# Patient Record
Sex: Female | Born: 1975 | Race: White | Hispanic: No | Smoking: Never smoker
Health system: Southern US, Community
[De-identification: ages and names within clinical notes are randomized; demographics above are authoritative.]

## PROBLEM LIST (undated history)

## (undated) DIAGNOSIS — M26609 Unspecified temporomandibular joint disorder, unspecified side: Secondary | ICD-10-CM

## (undated) DIAGNOSIS — F32A Depression, unspecified: Secondary | ICD-10-CM

## (undated) DIAGNOSIS — F329 Major depressive disorder, single episode, unspecified: Secondary | ICD-10-CM

## (undated) DIAGNOSIS — N62 Hypertrophy of breast: Secondary | ICD-10-CM

## (undated) DIAGNOSIS — F419 Anxiety disorder, unspecified: Secondary | ICD-10-CM

## (undated) DIAGNOSIS — K219 Gastro-esophageal reflux disease without esophagitis: Secondary | ICD-10-CM

## (undated) HISTORY — PX: REDUCTION MAMMAPLASTY: SUR839

## (undated) HISTORY — PX: APPENDECTOMY: SHX54

## (undated) HISTORY — DX: Anxiety disorder, unspecified: F41.9

## (undated) HISTORY — DX: Major depressive disorder, single episode, unspecified: F32.9

## (undated) HISTORY — DX: Depression, unspecified: F32.A

---

## 2001-08-02 ENCOUNTER — Ambulatory Visit (HOSPITAL_COMMUNITY): Admission: RE | Admit: 2001-08-02 | Discharge: 2001-08-02 | Payer: Self-pay | Admitting: *Deleted

## 2001-08-02 ENCOUNTER — Encounter (INDEPENDENT_AMBULATORY_CARE_PROVIDER_SITE_OTHER): Payer: Self-pay | Admitting: Specialist

## 2001-08-02 HISTORY — PX: HYSTEROSCOPY WITH D & C: SHX1775

## 2002-04-14 ENCOUNTER — Inpatient Hospital Stay (HOSPITAL_COMMUNITY): Admission: RE | Admit: 2002-04-14 | Discharge: 2002-04-16 | Payer: Self-pay | Admitting: *Deleted

## 2002-04-15 HISTORY — PX: HYSTERECTOMY ABDOMINAL WITH SALPINGO-OOPHORECTOMY: SHX6792

## 2003-03-12 ENCOUNTER — Encounter: Payer: Self-pay | Admitting: Emergency Medicine

## 2003-03-12 ENCOUNTER — Emergency Department (HOSPITAL_COMMUNITY): Admission: EM | Admit: 2003-03-12 | Discharge: 2003-03-12 | Payer: Self-pay | Admitting: Emergency Medicine

## 2006-07-15 ENCOUNTER — Emergency Department (HOSPITAL_COMMUNITY): Admission: EM | Admit: 2006-07-15 | Discharge: 2006-07-15 | Payer: Self-pay | Admitting: Emergency Medicine

## 2007-03-06 ENCOUNTER — Emergency Department (HOSPITAL_COMMUNITY): Admission: EM | Admit: 2007-03-06 | Discharge: 2007-03-06 | Payer: Self-pay | Admitting: Emergency Medicine

## 2008-11-27 ENCOUNTER — Ambulatory Visit (HOSPITAL_COMMUNITY): Admission: RE | Admit: 2008-11-27 | Discharge: 2008-11-27 | Payer: Self-pay | Admitting: General Surgery

## 2010-07-04 ENCOUNTER — Ambulatory Visit (HOSPITAL_COMMUNITY): Admission: RE | Admit: 2010-07-04 | Discharge: 2010-07-04 | Payer: Self-pay | Admitting: Family Medicine

## 2010-08-01 ENCOUNTER — Ambulatory Visit (HOSPITAL_COMMUNITY): Admission: RE | Admit: 2010-08-01 | Discharge: 2010-08-01 | Payer: Self-pay | Admitting: General Surgery

## 2010-08-16 ENCOUNTER — Ambulatory Visit (HOSPITAL_COMMUNITY): Admission: RE | Admit: 2010-08-16 | Discharge: 2010-08-17 | Payer: Self-pay | Admitting: General Surgery

## 2010-08-16 ENCOUNTER — Encounter (INDEPENDENT_AMBULATORY_CARE_PROVIDER_SITE_OTHER): Payer: Self-pay | Admitting: General Surgery

## 2010-08-16 HISTORY — PX: LAPAROSCOPIC CHOLECYSTECTOMY: SUR755

## 2011-01-19 ENCOUNTER — Encounter: Payer: Self-pay | Admitting: General Surgery

## 2011-01-19 ENCOUNTER — Encounter: Payer: Self-pay | Admitting: Family Medicine

## 2011-03-13 LAB — CBC
HCT: 39 % (ref 36.0–46.0)
Hemoglobin: 13.2 g/dL (ref 12.0–15.0)
MCH: 29.9 pg (ref 26.0–34.0)
MCH: 30.3 pg (ref 26.0–34.0)
MCHC: 33.9 g/dL (ref 30.0–36.0)
MCV: 88.3 fL (ref 78.0–100.0)
MCV: 88.5 fL (ref 78.0–100.0)
Platelets: 139 10*3/uL — ABNORMAL LOW (ref 150–400)
RBC: 3.87 MIL/uL (ref 3.87–5.11)
RDW: 12.2 % (ref 11.5–15.5)
RDW: 12.6 % (ref 11.5–15.5)

## 2011-03-13 LAB — DIFFERENTIAL
Basophils Absolute: 0 10*3/uL (ref 0.0–0.1)
Basophils Relative: 1 % (ref 0–1)
Lymphocytes Relative: 33 % (ref 12–46)
Lymphs Abs: 2.9 10*3/uL (ref 0.7–4.0)
Monocytes Relative: 8 % (ref 3–12)
Neutro Abs: 3.5 10*3/uL (ref 1.7–7.7)
Neutro Abs: 4.6 10*3/uL (ref 1.7–7.7)
Neutrophils Relative %: 57 % (ref 43–77)

## 2011-03-13 LAB — HEPATIC FUNCTION PANEL
ALT: 68 U/L — ABNORMAL HIGH (ref 0–35)
AST: 15 U/L (ref 0–37)
AST: 56 U/L — ABNORMAL HIGH (ref 0–37)
Albumin: 4.2 g/dL (ref 3.5–5.2)
Alkaline Phosphatase: 55 U/L (ref 39–117)
Bilirubin, Direct: 0.1 mg/dL (ref 0.0–0.3)
Bilirubin, Direct: 0.2 mg/dL (ref 0.0–0.3)
Indirect Bilirubin: 0.5 mg/dL (ref 0.3–0.9)
Indirect Bilirubin: 0.7 mg/dL (ref 0.3–0.9)
Total Protein: 7.1 g/dL (ref 6.0–8.3)

## 2011-03-13 LAB — BASIC METABOLIC PANEL
Calcium: 9.1 mg/dL (ref 8.4–10.5)
Creatinine, Ser: 0.83 mg/dL (ref 0.4–1.2)
Creatinine, Ser: 0.83 mg/dL (ref 0.4–1.2)
GFR calc Af Amer: >60 mL/min (ref >60)
GFR calc non Af Amer: >60 mL/min (ref >60)
Glucose, Bld: 87 mg/dL (ref 70–99)
Glucose, Bld: 97 mg/dL (ref 70–99)
Potassium: 3.9 mEq/L (ref 3.5–5.1)
Potassium: 4 mEq/L (ref 3.5–5.1)

## 2011-04-16 HISTORY — PX: TOTAL ABDOMINAL HYSTERECTOMY W/ BILATERAL SALPINGOOPHORECTOMY: SHX83

## 2011-05-16 NOTE — Op Note (Signed)
[facility]  Patient:    Sandra Parker, Sandra Parker Visit Number: [id number] MRN: [id number]          Service Type: SUR Location: 4A A426 01 Attending Physician:  Jeri Cos. Dictated by:   Roylene Reason. Lisette Grinder, M.D. Proc. Date: 04/15/02 Admit Date:  04/14/2002 Discharge Date: 04/16/2002                             Operative Report  PREOPERATIVE DIAGNOSIS:  1. Menometrorrhagia.  2. Dysmenorrhea. 3. Deep penetration dyspareunia.  POSTOPERATIVE DIAGNOSIS:  1. Menometrorrhagia.  2. Dysmenorrhea. 3. Deep penetration dyspareunia.  OPERATION/PROCEDURE:  1. Total abdominal hysterectomy. 2. Left salpingo-oophorectomy  SURGEON:  Roylene Reason. Lisette Grinder, M.D.  ESTIMATED BLOOD LOSS:  200 cc.  COMPLICATIONS:  None.  SPECIMENS:  Left tube and ovary, cervix and uterus for permanent section only.  DRAINS:  JP within the subcutaneous space, Foley catheter to straight drainage.  ANESTHESIA:  General endotracheal anesthesia  FINDINGS:  At the time of surgery include adhesive disease in the right adnexal region, normal appearing left tube and ovary.  Diffusely enlarged uterus.  SUMMARY:  The patient was taken to the operating room; vital signs were stable.  The patient underwent uncomplicated induction of general endotracheal anesthesia.  She was prepped and draped in the usual sterile manner.  Foley catheter placed to straight drainage.  Findings of clear yellow urine, sharp knife used to incise the previous Pfannenstiel incision through the skin dissected down to the fascial plane utilizing the sharp knife, cauterizing all bleeders along the way.  The fascia was then incised in a transverse curvilinear manner utilizing the Mayo scissors while sharply dissecting off the underlying rectus muscle.  The fascial edges were then grasped using straight Kocher clamps.  The fascia was dissected off the underlying rectus muscle in the midline utilizing sharp dissection with  the Mayo scissors.  The rectus muscle was bluntly separated. The peritoneal cavity was atraumatically bluntly entered at the superior-most portion of the incision.  The peritoneal incision was extended superiorly and inferiorly.  Inferiorly I directly visualized the bladder to avoid its accidental entry.  A Balfour retractor was then placed.  Bladder blade was replaced.  Long straight Kocher clamps were used to grasp the specimen at the junction of the round ligament and Fallopian tubes and the uterus itself.  This was made difficult in the right adnexal region by omental adhesions to the right fundal portion of the uterus which were sharply dissected in the avascular plane. The specimen was then manipulated throughout the operative procedure utilizing straight Kocher clamps.  The right round ligament was identified.  A Kelly clamp was placed to control back bleeding.  Suture ligature of #0 Vicryl in a Heaney fashion was placed. Right round ligament was transected.  I continued the dissection across the anterior lower uterine segment in the avascular plane to create a bladder flap from the vesicouterine fold.  The left adnexal region was then managed. Kelly clamp was placed on the round ligament to control back bleeding.  Suture ligature of #0 Vicryl in a Heaney fashion was then placed in the left round ligament, and the left round ligament was then transected.  The infundibulopelvic ligament on the left was skeletonized in the avascular plane.  The ureter is noted to be in its normal anatomic position along the lateral pelvic sidewall free from harms way.  The left infundibulopelvic ligament was then doubly clamped with  Kelly clamps followed by being doubly ligated with first a #0 Vicryl free-tie followed by suture of #0 Vicryl in a Heaney fashion.  This results in excellent hemostasis in the pedicle.  The bladder flap is then continued on the left side across the anterior  lower uterine segment in the avascular plane.  The bladder flap was then created from the vesicouterine fold and is bluntly and sharply dissected off the anterior lower uterine segment.  Dissection is continued to push the bladder flap distal to the cervix.  Her pedicles are then continued after skeletonization of the uterine vessels bilaterally.  The right is then clamped with a Kelly clamp to control any back bleeding.  A curved Haney clamp is then placed across the right uterine vessel, followed by a suture ligature of #0 Vicryl to assure hemostasis.  The left uterine vessel was likewise handled in a similar manner utilizing curved Heaney clamp followed by suture ligature of #0 Vicryl to secure hemostasis.  These clamps were being removed the.  The major vascular supply o the uterus has been secured and the uterus is noted to blanch in its appearance due to cessation of major blood supply.  The right cardinal ligament was then grasped with a straight Heaney clamp followed by suture ligature of #0 Vicryl in a Heaney fashion.  The left was handled in a likewise manner.  An additional straight Heaney clamp was placed both on the right and on the left to secure the uterosacral ligaments, utilizing #0 Vicryl in a Heaney fashion.  This then brought me down to the vaginal angle.  The right vaginal angle was clamped with a curved Heaney clamp followed by suture ligature of #0 Vicryl in a Heaney fashion which was tagged for later inspection.  The left vaginal angle was likewise clamped with a curved Heaney clamp followed by suture ligature of Vicryl in a Heaney fashion and likewise clamped.  This then allows me to easily dissect off the vaginal cuff maintaining maximum vaginal length while being certain to remove the entirety of the cervix.  The specimen was then removed, inspection does reveal the entirety of the cervix to be included.  The vaginal angle was then elevated which allows  visualization of the vaginal cuff which then was easily closed utilizing #0 Vicryl figure-of-eight suture.  Careful examination of our pedicles, at this time, reveals a small amount of bleeding from the right uterosacral ligament area where our pedicle apparently slipped through the clamp, at the time of ligature.  This was then reclamped with a curved Heaney clamp followed by religation with a #0 Vicryl.  This then resulted in secure hemostasis at this area.  The vaginal cuff is noted to be secured, but a small amount of bleeding is noted to be occurring from beneath the bladder flap due to presence of adhesive disease encountered, due to the prior cesarean section.  The bladder flap was then reapproximated over the vaginal cuff utilizing continuous running, 3-0 Vicryl suture.  This restored the normal anatomy and resulted in excellent hemostasis.  Irrigation was then performed until clear. All pedicles being secured, ureter was noted to be out of harms way.  Our Balfour retaining retractor is removed.  Sponge and instrument counts are correct, at this time, thus the closure is initiated.  The peritoneal edges are grasped utilizing Kelly clamps followed by closure of the peritoneum with a #0 chromic continuous running suture.  This likewise reapproximated the overlying rectus muscle.  Bleeders on the  rectus muscles were then cauterized to assure hemostasis.  The fascia was then closed with a #1 Novofil double stranded suture to result in a secure closure.  Subcutaneous bleeders are then cauterized.  The JP drain is placed in the subcutaneous space with a separate exit wound to the right apex of the incision.  This is sutured into place.  Three retention-type sutures are then placed through the skin edges to facilitate stapling.  The skin is then completely closed utilizing skin staples.  To facilitate postoperative analgesia a total of 20 cc of 0.5% bupivacaine is injected along the  entirety of the incision.  The JP drain is connected and appears to be functioning well.  The patient continues to drain clear yellow urine.  The patient tolerated the procedure very well.  She is reversed of anesthesia, extubated, and taken to the recovery room in stable condition at which time the operative findings are discussed with the patient awaiting family.  The patient will be treated immediately postoperatively with a Climara estrogen replacement patch. Dictated by:   Roylene Reason. Lisette Grinder, M.D. Attending Physician:  Jeri Cos. DD:  04/15/02 TD:  04/17/02 Job: [id number]

## 2011-05-16 NOTE — Op Note (Signed)
[facility]  Patient:    Sandra Parker, Sandra Parker                          MRN: [id number] Proc. Date: 08/02/01 Adm. Date:  [id number] Attending:  Marin Comment                           Operative Report  PREOPERATIVE DIAGNOSES:  Menometrorrhagia unresponsive to oral contraceptive therapy, abnormal transvaginal sonogram suggesting filling defect.  POSTOPERATIVE DIAGNOSES:  Menometrorrhagia unresponsive to oral contraceptive therapy, abnormal transvaginal sonogram suggesting filling defect.  PROCEDURE:  Examination under anesthesia, fractional D&C hysteroscopy, collection of Pap smear.  ANESTHESIA:  MAC plus Marcaine paracervical block.  SURGEON:  Pershing Cox, M.D.  INDICATIONS FOR PROCEDURE:  This 35 year old female has been followed in my office for the last month. She has had normal menstrual periods until the onset of this bleeding episode which has been heavy, changing a pad as often as every 30 minutes during some of her days of flow. She was put on oral contraceptive taper and continued bleeding. She was seen in my office on last Tuesday and at that time, she had a sonogram performed which suggested a thickened endometrium of 1.2 cm in size and suggestion of endometrial polyp. She was still bleeding over the weekend but stopped yesterday. She is here today for [facility] hysteroscopy.  FINDINGS:  The uterus is anteflexed. The cavity was 8 cm in depth. The endometrial cavity was totally normal in appearance with symmetrical shape and normal tubal ostia noted. There were some fragments of endometrium adherent to the walls but otherwise no significant findings. Photographs were taken to document.  DESCRIPTION OF PROCEDURE:  [name] was brought to the operating room with an IV in place. In the holding area, she had received a gram of Ancef. She was placed supine on the OR table and IV sedation was administered. A bivalve speculum was inserted into  the vagina and a Pap smear was collected. Examination under anesthesia was performed confirming the fact that the uterus is anteflexed and there were no adnexal masses. The patient was then prepped with Hibiclens applying the solution to the anterior abdominal wall, perineum, vagina and upper thighs. A bivalve speculum was inserted into the vagina. Marcaine was injected in the anterior cervix and a single tooth tenaculum was placed. Endocervical curettings were obtained. Marcaine paracervical block was then administered at  the 3, 4, 7 and 8 positions using a total volume of 15 cc of 0.25% solution. This sound passed in an anteverted position to 8 cm. The cervix was then serially dilated with [facility] dilators to size 33. The hysteroscope was inserted and using through and through sorbitol irrigation, the cavity was distended and photographed. There were pieces of endometrium adherent to the walls but there was no evidence of filling defect and both tubal ostia were visualized. The hysteroscope was removed and a small sharp curette was used to curette the walls serially. Curettings were collected. The serrated curette was then applied and after scraping all of the walls again, the sharp curette finished the D&C.  The patient tolerated the procedure well. The instruments were removed. Her perineum was cleaned and she was taken to the recovery room for recovery. DD:  08/02/01 TD:  08/03/01 Job: [id number]

## 2011-05-16 NOTE — Discharge Summary (Signed)
[facility]  Patient:    Sandra Parker, Sandra Parker Visit Number: [id number] MRN: [id number]          Service Type: SUR Location: 4A A426 01 Attending Physician:  Jeri Cos. Dictated by:   Langley Gauss, M.D. Admit Date:  04/14/2002 Discharge Date: 04/16/2002                             Discharge Summary  PROCEDURE:  Total abdominal hysterectomy, left salpingo-oophorectomy.  DISCHARGE MEDICATIONS:  Tylox dispense #20 with no refill. The patient is given a copy of the standardized instructions at time of discharge. In addition, she has a Climara patch in place.  FOLLOWUP:  She will follow up in the office in three days time for staple removal.  LABORATORY DATA:  Blood type:  A positive. Admission hemoglobin/hematocrit 10.9/30.9. Postoperative day #1:  Hemoglobin 9.2, hematocrit 25.8. Postoperative day #2:  Hemoglobin 9.1, hematocrit 25.5 with a white count of 6.5.  HOSPITAL COURSE:  The patient had TAHLSO performed on April 14, 2002 without difficulty. Intraoperative blood loss of 200 cc was estimated. The patient had a Foley catheter and a JP drain within the subcutaneous space postoperatively. The patient did have a borderline urine output the p.m. of surgery. She was treated with a 500 cc bolus which resulted in approved urinary output. Such that the patient had adequate urine output thereafter, Foley catheter was removed on postoperative day #1. The patient thereafter was able to ambulate and void. The JP catheter did clot off relatively early in the postoperative course. Aspiration was unsuccessful in dislodging any clots, thus the JP catheter was discontinued on postoperative day #1. The patient thereafter did have a postoperative ileus with no passage of flatus. She was encouraged to ambulate. She did so. On the a.m. of postoperative day #2, the patient had still not passed any flatus, abdomen however was soft and nontender and nondistended. She  did have gas pains at that time. Thus, she was encouraged to ambulate and later in the day on postoperative day #2, the patient was freely passing flatus, tolerating a regular general diet, likewise tolerating p.o. narcotics for pain relief. The patient had minimal complaints of hot flashes and the Climara patch was adhering very well. Thus, the patient was discharged to home on postoperative day #2, given a copy of the standardized discharge instructions. Dictated by:   Langley Gauss, M.D. Attending Physician:  Jeri Cos. DD:  04/17/02 TD:  04/18/02 Job: [id number]

## 2012-01-30 ENCOUNTER — Other Ambulatory Visit: Payer: Self-pay

## 2012-08-04 ENCOUNTER — Telehealth: Payer: Self-pay | Admitting: Obstetrics and Gynecology

## 2012-08-04 ENCOUNTER — Telehealth: Payer: Self-pay

## 2012-08-04 NOTE — Telephone Encounter (Signed)
TRIAGE/APPT. °

## 2012-08-04 NOTE — Telephone Encounter (Signed)
Spoke with pt informed pt consulted with EP assistant no able to see pt today to due schd being already double booked will be happy to see her tomorrow pt states going out of town tomorrow advised pt can call her pcp to see if they can see her pt voice understadning

## 2012-08-04 NOTE — Telephone Encounter (Signed)
Spoke with pt rgd msg pt states having hormonal issues very emotional times 1 month pt states on HRT taking sentest thinks rx not helping wants appt for today advised pt no appt available today offered pt an appt for tomorrow with EP pt declined appt states going out of town advised pt will consult with ep call her back pt voice understanding

## 2012-09-27 ENCOUNTER — Encounter: Payer: Self-pay | Admitting: Obstetrics and Gynecology

## 2013-01-10 ENCOUNTER — Telehealth: Payer: Self-pay | Admitting: Obstetrics and Gynecology

## 2013-01-10 NOTE — Telephone Encounter (Signed)
Tc to pt per telephone call. Pt c/o right breast tenderness around nipple area. No nipple d/c. Appt sched 01/17/13 @ 2:45 for eval per EP. Pt aware AEX due 08/2012. Pt will sched @ later time. Pt agrees.

## 2013-01-17 ENCOUNTER — Encounter: Payer: Self-pay | Admitting: Obstetrics and Gynecology

## 2013-01-17 ENCOUNTER — Ambulatory Visit (HOSPITAL_COMMUNITY): Payer: Self-pay | Admitting: Physical Therapy

## 2013-03-24 ENCOUNTER — Telehealth: Payer: Self-pay | Admitting: Family Medicine

## 2013-03-24 DIAGNOSIS — F329 Major depressive disorder, single episode, unspecified: Secondary | ICD-10-CM

## 2013-03-24 DIAGNOSIS — F32A Depression, unspecified: Secondary | ICD-10-CM

## 2013-03-24 NOTE — Telephone Encounter (Signed)
Started on Lexapro 10mg  QD on 12/06/12.  C/o may need higher dose.  Getting very sad and low by end of day.

## 2013-03-25 MED ORDER — ESCITALOPRAM OXALATE 20 MG PO TABS: 20.0000 mg | ORAL_TABLET | Freq: Every day | ORAL | Status: DC

## 2013-03-25 NOTE — Telephone Encounter (Signed)
Callled patient and left voice mess.  Lexapro increased to 20mg /day.  NTBS in 6 weeks for follow up. Call and schedule appointment

## 2013-03-25 NOTE — Telephone Encounter (Signed)
Tell pt we can increase lexapro to 20 mg. Needs to schedule OV to be seen in 6 weeks for f/u. Call in Rx; Lexapro 20 mg one po QD 30 / one additional refill.

## 2013-03-28 ENCOUNTER — Telehealth: Payer: Self-pay | Admitting: Physician Assistant

## 2013-03-28 ENCOUNTER — Ambulatory Visit (INDEPENDENT_AMBULATORY_CARE_PROVIDER_SITE_OTHER): Payer: BC Managed Care – PPO | Admitting: Physician Assistant

## 2013-03-28 ENCOUNTER — Encounter: Payer: Self-pay | Admitting: Physician Assistant

## 2013-03-28 VITALS — BP 108/80 | HR 80 | Temp 98.5°F | Resp 18 | Ht 60.5 in | Wt 158.0 lb

## 2013-03-28 DIAGNOSIS — M26629 Arthralgia of temporomandibular joint, unspecified side: Secondary | ICD-10-CM

## 2013-03-28 NOTE — Telephone Encounter (Signed)
Approved. # 90 / one additional refill.

## 2013-03-28 NOTE — Progress Notes (Signed)
   Patient ID: Sandra Parker MRN: [id number], DOB: December 10, 1976, 37 y.o. Date of Encounter: 03/28/2013, 3:21 PM    Chief Complaint:  Chief Complaint  Patient presents with  . rt ear pain with dizziness x 3 days    using OTC ear drop     HPI: 37 y.o. year old female presents with c/o pain in right ear. Has had no nasal congestion or nasal mucus. No sinus pain or pressure. No sore throat. No cough/chest congestion. No fever/chills.    Home Meds: Current Outpatient Prescriptions on File Prior to Visit  Medication Sig Dispense Refill  . escitalopram (LEXAPRO) 20 MG tablet Take 1 tablet (20 mg total) by mouth daily.  30 tablet  1   No current facility-administered medications on file prior to visit.    Allergies:  Allergies  Allergen Reactions  . Ciprofloxacin   . Prozac (Fluoxetine) Other (See Comments)    Bad headaches  . Relafen (Nabumetone)   . Wellbutrin (Bupropion)     Bad dreams,  Feel crazy  . Cephalexin Rash      Review of Systems: Constitutional: negative for chills, fever, night sweats, weight changes, or fatigue  HEENT: negative for vision changes, hearing loss, congestion, rhinorrhea, ST, epistaxis, or sinus pressure Cardiovascular: negative for chest pain or palpitations Respiratory: negative for hemoptysis, wheezing, shortness of breath, or cough Abdominal: negative for abdominal pain, nausea, vomiting, diarrhea, or constipation Dermatological: negative for rash Neurologic: negative for headache, dizziness, or syncope    Physical Exam: Blood pressure 108/80, pulse 80, temperature 98.5 F (36.9 C), temperature source Oral, resp. rate 18, height 5' 0.5" (1.537 m), weight 158 lb (71.668 kg)., Body mass index is 30.34 kg/(m^2). General: Well developed, well nourished,WF. in no acute distress. HEENT: Normocephalic, atraumatic, eyes without discharge, sclera non-icteric, nares are without discharge. Bilateral auditory canals clear. No edema,erythema, no purulent  drainage. TM's are without perforation, pearly grey and translucent with reflective cone of light bilaterally. Oral cavity moist, posterior pharynx without exudate, erythema, peritonsillar abscess, or post nasal drip. Right TM Joint is tender with palpation and has popping/clicking when she opens/closes Neck: Supple. No thyromegaly. Full ROM. No lymphadenopathy. Lungs: Clear bilaterally to auscultation without wheezes, rales, or rhonchi. Breathing is unlabored. Heart: RRR with S1 S2. No murmurs, rubs, or gallops appreciated. Msk:  Strength and tone normal for age. Extremities/Skin: Warm and dry. No clubbing or cyanosis. No edema. No rashes or suspicious lesions. Neuro: Alert and oriented X 3. Moves all extremities spontaneously. Gait is normal. CNII-XII grossly in tact. Psych:  Responds to questions appropriately with a normal affect.     ASSESSMENT AND PLAN:  37 y.o. year old female with  1. TMJ arthralgia  After I examined her ears and neck and told her that her ear exam was normal, she remembered that she just had oral surgery and had to sit with her mouth wide open a long time. Her pain seems to be c/w TMJ. She is to avoid hard, crunch foods. Eat soft foods.. Take small bites. (No thick sandwiches). Use NSAIDs prn. F/u if worsens, develops new symptoms.   Sandra Parker, [place], [facility] 03/28/2013 3:21 PM

## 2013-03-28 NOTE — Telephone Encounter (Signed)
Need approval for controlled medication. Alprazolam 1mg  TID PRN

## 2013-03-28 NOTE — Telephone Encounter (Signed)
Medication refilled per protocol. 

## 2013-05-06 ENCOUNTER — Telehealth: Payer: Self-pay | Admitting: Physician Assistant

## 2013-05-06 NOTE — Telephone Encounter (Signed)
Pt states prozac is not working and wants to go back to Lexapro. Please advise

## 2013-05-08 NOTE — Telephone Encounter (Signed)
She needs to schedule OV.  

## 2013-05-09 NOTE — Telephone Encounter (Signed)
Tried to call pt.  Left mess that she needs to schedule an appt to discuss med change with provider.

## 2013-05-13 ENCOUNTER — Ambulatory Visit: Payer: BC Managed Care – PPO | Admitting: Physician Assistant

## 2013-05-18 ENCOUNTER — Ambulatory Visit (INDEPENDENT_AMBULATORY_CARE_PROVIDER_SITE_OTHER): Payer: BC Managed Care – PPO | Admitting: Physician Assistant

## 2013-05-18 ENCOUNTER — Encounter: Payer: Self-pay | Admitting: Physician Assistant

## 2013-05-18 VITALS — BP 112/80 | HR 88 | Temp 97.9°F | Resp 18 | Ht 60.75 in | Wt 157.0 lb

## 2013-05-18 DIAGNOSIS — F411 Generalized anxiety disorder: Secondary | ICD-10-CM

## 2013-05-18 DIAGNOSIS — F3289 Other specified depressive episodes: Secondary | ICD-10-CM

## 2013-05-18 DIAGNOSIS — F419 Anxiety disorder, unspecified: Secondary | ICD-10-CM | POA: Insufficient documentation

## 2013-05-18 DIAGNOSIS — F32A Depression, unspecified: Secondary | ICD-10-CM | POA: Insufficient documentation

## 2013-05-18 DIAGNOSIS — F329 Major depressive disorder, single episode, unspecified: Secondary | ICD-10-CM | POA: Insufficient documentation

## 2013-05-18 MED ORDER — DULOXETINE HCL 30 MG PO CPEP: 30.0000 mg | ORAL_CAPSULE | Freq: Every day | ORAL | Status: DC

## 2013-05-18 NOTE — Progress Notes (Signed)
   Patient ID: SHANNIA JACUINDE MRN: [id number], DOB: [birth year], 37 y.o. Date of Encounter: 05/18/2013, 12:25 PM    Chief Complaint:  Chief Complaint  Patient presents with  . Medication Problem    lexapro not working     HPI: 37 y.o. year old female here to f/u anxiety/depression. At OV 12/13 started Lexapro. Today she reports that she saw no effect at all so she has stopped the medication.  However, off medicine, feels anxious/depressed. Recently her boss asked her if something was wrong and she started to cry. Has had anxiety relate dto exhusband, issues with her children etc.   Home Meds: See attached medication section for any medications that were entered at today's visit. The computer does not put those onto this list.The following list is a list of meds entered prior to today's visit.   Current Outpatient Prescriptions on File Prior to Visit  Medication Sig Dispense Refill  . ALPRAZolam (XANAX) 1 MG tablet Take 1 mg by mouth 3 (three) times daily as needed for anxiety.       Marland Kitchen estradiol (ESTRACE) 1 MG tablet Take 1 mg by mouth daily. Take 1 1/2 tablets daily      . Multiple Vitamin (MULTIVITAMIN) tablet Take 1 tablet by mouth daily. One-a-day       No current facility-administered medications on file prior to visit.    Allergies:  Allergies  Allergen Reactions  . Ciprofloxacin   . Prozac (Fluoxetine) Other (See Comments)    Bad headaches  . Relafen (Nabumetone)   . Wellbutrin (Bupropion)     Bad dreams,  Feel crazy  . Cephalexin Rash      Review of Systems: See HPI for pertinent ROS. All other ROS negative.    Physical Exam: Blood pressure 112/80, pulse 88, temperature 97.9 F (36.6 C), temperature source Oral, resp. rate 18, height 5' 0.75" (1.543 m), weight 157 lb (71.215 kg)., Body mass index is 29.91 kg/(m^2). General:WF.  Appears in no acute distress. Lungs: Clear bilaterally to auscultation without wheezes, rales, or rhonchi. Breathing is unlabored. Heart:  Regular rhythm. No murmurs, rubs, or gallops. Msk:  Strength and tone normal for age. Extremities/Skin: Warm and dry. No clubbing or cyanosis. No edema. No rashes or suspicious lesions. Neuro: Alert and oriented X 3. Moves all extremities spontaneously. Gait is normal. CNII-XII grossly in tact. Psych:  Responds to questions appropriately with a normal affect. Calm, pleasant, appropriate during visit.      ASSESSMENT AND PLAN:  37 y.o. year old female with  1. Anxiety - DULoxetine (CYMBALTA) 30 MG capsule; Take 1 capsule (30 mg total) by mouth daily.  Dispense: 30 capsule; Refill: 1  2. Depression - DULoxetine (CYMBALTA) 30 MG capsule; Take 1 capsule (30 mg total) by mouth daily.  Dispense: 30 capsule; Refill: 1  In past :  Prozac: headache Wellbutrin: Bad Dreams Lexapro ; No effect  Will try a SNRI to see if this is the right fit for her. Discussed with her proper expectations-take dialy until f/u OV. Takes up to 4- 6 weeks to be effective.F/U sooner if adverse effects. I also told her this comes in higher doses if necessary. Do not stop med just b/c she notices no effect. F/U OV 6 weeks.  [address] [place], [place], [facility] 05/18/2013 12:25 PM

## 2013-05-19 ENCOUNTER — Telehealth: Payer: Self-pay | Admitting: Physician Assistant

## 2013-05-19 NOTE — Telephone Encounter (Signed)
Will use Zoloft 50 mg QD. Still needs f/u OV 6 weeks Send in Rx: Zoloft 50mg   One po QD # 30/ 0ne refill

## 2013-05-20 MED ORDER — SERTRALINE HCL 50 MG PO TABS: 50.0000 mg | ORAL_TABLET | Freq: Every day | ORAL | Status: DC

## 2013-05-20 NOTE — Telephone Encounter (Signed)
Pt is aware of change in meds. We just need to send it into pharmacy please.

## 2013-05-20 NOTE — Telephone Encounter (Signed)
Med rf °

## 2013-05-30 ENCOUNTER — Telehealth: Payer: Self-pay | Admitting: Physician Assistant

## 2013-05-30 MED ORDER — ALPRAZOLAM 1 MG PO TABS: 1.0000 mg | ORAL_TABLET | Freq: Three times a day (TID) | ORAL | Status: DC | PRN

## 2013-05-30 NOTE — Telephone Encounter (Signed)
Pt just seen. Refill called into pharm  #90 no rf

## 2013-06-10 ENCOUNTER — Other Ambulatory Visit: Payer: Self-pay | Admitting: Obstetrics and Gynecology

## 2013-06-10 DIAGNOSIS — N644 Mastodynia: Secondary | ICD-10-CM

## 2013-06-10 DIAGNOSIS — R234 Changes in skin texture: Secondary | ICD-10-CM

## 2013-06-15 ENCOUNTER — Ambulatory Visit: Payer: BC Managed Care – PPO | Admitting: Family Medicine

## 2013-06-17 ENCOUNTER — Ambulatory Visit (INDEPENDENT_AMBULATORY_CARE_PROVIDER_SITE_OTHER): Payer: BC Managed Care – PPO | Admitting: Family Medicine

## 2013-06-17 ENCOUNTER — Encounter: Payer: Self-pay | Admitting: Family Medicine

## 2013-06-17 ENCOUNTER — Ambulatory Visit
Admission: RE | Admit: 2013-06-17 | Discharge: 2013-06-17 | Disposition: A | Discharge status: Home / Self Care | Payer: BC Managed Care – PPO | Source: Ambulatory Visit | Attending: Obstetrics and Gynecology | Admitting: Obstetrics and Gynecology

## 2013-06-17 VITALS — BP 100/68 | HR 78 | Temp 98.6°F | Resp 18 | Ht 61.0 in | Wt 158.0 lb

## 2013-06-17 DIAGNOSIS — R234 Changes in skin texture: Secondary | ICD-10-CM

## 2013-06-17 DIAGNOSIS — R42 Dizziness and giddiness: Secondary | ICD-10-CM

## 2013-06-17 DIAGNOSIS — N644 Mastodynia: Secondary | ICD-10-CM

## 2013-06-17 NOTE — Patient Instructions (Signed)
Ibuprofen 600mg  Twice a day for the shoulder and ICE  Call if the vertigo symptoms com back F/U [facility] as needed

## 2013-06-19 DIAGNOSIS — R42 Dizziness and giddiness: Secondary | ICD-10-CM | POA: Insufficient documentation

## 2013-06-19 NOTE — Progress Notes (Signed)
  Subjective:    Patient ID: Sandra Parker, female    DOB: [birth year], 37 y.o.   MRN: [id number]  HPI  Pt here with dizziness for the past 2 days, none today. Felt like she was off balance and dizzy after getting up wed when moving her head. Persisted on and  Off, mild nausea, no emesis, no HA, no injury to head. Thursday symptoms occurred again felt like everything was spinning. Given meclizine OTC my pharmacy which helped symptoms. Newest medication Zoloft given 4 week again. Denies URI , sinusitis. Tinnitus, ear pain  Review of Systems  GEN- denies fatigue, fever, weight loss,weakness, recent illness HEENT- denies eye drainage, change in vision, nasal discharge, CVS- denies chest pain, palpitations RESP- denies SOB, cough, wheeze ABD- + N/denies V, change in stools, abd pain Neuro- denies headache, +dizziness, syncope, seizure activity      Objective:   Physical Exam GEN- NAD, alert and oriented x3   Orthostatic BP- Lying 120/76 Standing 122/70 HEENT- PERRL, EOMI, non injected sclera, pink conjunctiva, MMM, oropharynx clear TM clear bilat Neck- Supple, no thyromegaly CVS- RRR, no murmur RESP-CTAB EXT- No edema NEURO-CNII-XII in tact, no focal deficits, no nystagmus Pulses- Radial, DP- 2+        Assessment & Plan:

## 2013-06-19 NOTE — Assessment & Plan Note (Signed)
Normal examination, now resolved, unclear cause likley benign positional Meclizine has helped symptoms For now will monitor and see if symptoms occur again

## 2013-06-20 ENCOUNTER — Telehealth: Payer: Self-pay | Admitting: Physician Assistant

## 2013-06-21 NOTE — Telephone Encounter (Signed)
LMTRC

## 2013-06-24 NOTE — Telephone Encounter (Signed)
Pt saw Dr. Jeanice Lim 6/20 with vertigo. I read note. I donot see that she Rxed any medication. Call pt and see what her question is regarding.

## 2013-06-24 NOTE — Telephone Encounter (Signed)
Pt wants a refill on Phentermine

## 2013-06-25 NOTE — Telephone Encounter (Signed)
I donot think it is safe for her to keep using this. She has never had long term success with this either. I donot think benefit outweighs risks

## 2013-06-27 ENCOUNTER — Other Ambulatory Visit: Payer: Self-pay | Admitting: Physician Assistant

## 2013-06-28 NOTE — Telephone Encounter (Signed)
Approved. #90+ 0. 

## 2013-06-28 NOTE — Telephone Encounter (Signed)
Pt states that she did not take the 2 month refill that you had prescribed and prescription had run out. Would like to have it filled again.

## 2013-06-28 NOTE — Telephone Encounter (Signed)
Med called out 

## 2013-06-28 NOTE — Telephone Encounter (Signed)
?   OK to Refill  

## 2013-06-29 ENCOUNTER — Ambulatory Visit: Payer: BC Managed Care – PPO | Admitting: Physician Assistant

## 2013-06-29 MED ORDER — ALPRAZOLAM 1 MG PO TABS: ORAL_TABLET | ORAL | Status: DC

## 2013-06-29 NOTE — Telephone Encounter (Signed)
Sandra Parker wants you to give her call states she is not understanding what u are saying

## 2013-07-07 ENCOUNTER — Other Ambulatory Visit: Payer: Self-pay | Admitting: Physician Assistant

## 2013-07-07 NOTE — Telephone Encounter (Signed)
Pt once again asking for diet pills.  She does this off and on every few months.  Make no earnest efforts on own to modify diet or make life style change to lose weight on her on.  Does not keep routine office appointments or follow up in past when diet pills have been ordered.  Refill request denied.

## 2013-07-08 NOTE — Telephone Encounter (Signed)
AGREE

## 2013-07-20 ENCOUNTER — Ambulatory Visit (HOSPITAL_COMMUNITY)
Admission: RE | Admit: 2013-07-20 | Discharge: 2013-07-20 | Disposition: A | Discharge status: Home / Self Care | Payer: BC Managed Care – PPO | Source: Ambulatory Visit | Attending: Family Medicine | Admitting: Family Medicine

## 2013-07-20 ENCOUNTER — Encounter: Payer: Self-pay | Admitting: Family Medicine

## 2013-07-20 ENCOUNTER — Ambulatory Visit (INDEPENDENT_AMBULATORY_CARE_PROVIDER_SITE_OTHER): Payer: BC Managed Care – PPO | Admitting: Family Medicine

## 2013-07-20 VITALS — BP 108/80 | HR 78 | Temp 97.5°F | Resp 16 | Wt 159.0 lb

## 2013-07-20 DIAGNOSIS — M25519 Pain in unspecified shoulder: Secondary | ICD-10-CM | POA: Insufficient documentation

## 2013-07-20 DIAGNOSIS — F419 Anxiety disorder, unspecified: Secondary | ICD-10-CM

## 2013-07-20 DIAGNOSIS — F329 Major depressive disorder, single episode, unspecified: Secondary | ICD-10-CM

## 2013-07-20 DIAGNOSIS — M25512 Pain in left shoulder: Secondary | ICD-10-CM

## 2013-07-20 DIAGNOSIS — F3289 Other specified depressive episodes: Secondary | ICD-10-CM

## 2013-07-20 DIAGNOSIS — F32A Depression, unspecified: Secondary | ICD-10-CM

## 2013-07-20 DIAGNOSIS — F411 Generalized anxiety disorder: Secondary | ICD-10-CM

## 2013-07-20 DIAGNOSIS — E669 Obesity, unspecified: Secondary | ICD-10-CM

## 2013-07-20 DIAGNOSIS — E663 Overweight: Secondary | ICD-10-CM | POA: Insufficient documentation

## 2013-07-20 MED ORDER — NAPROXEN 500 MG PO TABS: 500.0000 mg | ORAL_TABLET | Freq: Two times a day (BID) | ORAL | Status: DC

## 2013-07-20 MED ORDER — HYDROCODONE-ACETAMINOPHEN 5-325 MG PO TABS: 1.0000 | ORAL_TABLET | Freq: Four times a day (QID) | ORAL | Status: DC | PRN

## 2013-07-20 NOTE — Assessment & Plan Note (Signed)
Xray obtained, shows degeneration at [facility] with a bony overgrowth Given hydrocodone and naprosyn, will see if this improves it If not refer to ortho

## 2013-07-20 NOTE — Assessment & Plan Note (Signed)
Discussed diet and exercise, I recommend she not restart phentermine based on her current weight , current treatment for anxiety/depression Advised 1500 calories exercise routine

## 2013-07-20 NOTE — Assessment & Plan Note (Signed)
Improved, continue zoloft

## 2013-07-20 NOTE — Patient Instructions (Addendum)
Take the pain medication as needed Continue icing the arm Use the naprosyn for inflammation Continue all other medication Xray of shoulder F/U 4 months

## 2013-07-20 NOTE — Progress Notes (Signed)
  Subjective:    Patient ID: Sandra Parker, female    DOB: October 05, 1976, 37 y.o.   MRN: [id number]  HPI  Pt here to f/u medications. Started on zoloft 2 months ago, divorced from husband caring for 2 teenagers and working. Has a lot of stress and was not sleeping well. Feels zoloft keeps her "even". Sleep is improved. No side effects to medication. Uses xanax sparingly.   Obesity- wants to restart phentermine, in the past was on Wellbutrin and insurance would not pay as it was contraindicated. She took for 1 month, lost a few pounds but weight returned. Tries to walk some. Does not watch diet.   Shoulder pain- Left shoulder pain for past month. Mentioned at end of last visit. Took ibuprofen with some relief, ICE helped some. Feels a knot on shoulder and that it swells. No injury.   Review of Systems  GEN- denies fatigue, fever, weight loss,weakness, recent illness HEENT- denies eye drainage, change in vision, nasal discharge, CVS- denies chest pain, palpitations RESP- denies SOB, cough, wheeze ABD- denies N/V, change in stools, abd pain GU- denies dysuria, hematuria, dribbling, incontinence MSK- denies joint pain, muscle aches, injury Neuro- denies headache, dizziness, syncope, seizure activity      Objective:   Physical Exam GEN-NAD,alert and oriented x 3 Neck- Supple, normal ROM MSK- Bilateral shoulder, mild swelling/prominence near [facility] left shoulder. TTP over same region. Pain with empty can. Rotator cuff in tact. Biceps in tact. +pain with Neer's Neuro- Strength equal bilat UE, sensation in tact Psych- Normal affect and mood  PHQ9- 3 GAD7-3     Assessment & Plan:

## 2013-07-20 NOTE — Assessment & Plan Note (Signed)
Improved, continue zoloft, prn benzo

## 2013-07-22 ENCOUNTER — Ambulatory Visit: Payer: Self-pay | Admitting: Family Medicine

## 2013-07-29 ENCOUNTER — Other Ambulatory Visit: Payer: Self-pay | Admitting: Physician Assistant

## 2013-07-29 NOTE — Telephone Encounter (Signed)
Has recent office visit.  Last refill was 06/29/13.  Refill appropriate.  One month called no refills.

## 2013-08-01 ENCOUNTER — Other Ambulatory Visit: Payer: Self-pay | Admitting: Physician Assistant

## 2013-08-01 NOTE — Telephone Encounter (Signed)
approved

## 2013-08-01 NOTE — Telephone Encounter (Signed)
Medication refilled per protocol. 

## 2013-08-03 ENCOUNTER — Telehealth: Payer: Self-pay | Admitting: Physician Assistant

## 2013-08-04 NOTE — Telephone Encounter (Signed)
Patient called,  Told she would have to call around to the different diet centers to see if that treatment was offered.  We did not know of anywhere.

## 2013-08-04 NOTE — Telephone Encounter (Signed)
She will have to check into this herself. I am not familiar with this. She may want to go to a weight loss center for management.

## 2013-08-11 ENCOUNTER — Ambulatory Visit: Payer: BC Managed Care – PPO | Admitting: Physician Assistant

## 2013-08-15 ENCOUNTER — Ambulatory Visit: Payer: BC Managed Care – PPO | Admitting: Family Medicine

## 2013-08-16 ENCOUNTER — Telehealth: Payer: Self-pay | Admitting: Physician Assistant

## 2013-08-16 DIAGNOSIS — M25511 Pain in right shoulder: Secondary | ICD-10-CM

## 2013-08-16 DIAGNOSIS — M19019 Primary osteoarthritis, unspecified shoulder: Secondary | ICD-10-CM

## 2013-08-16 MED ORDER — HYDROCODONE-ACETAMINOPHEN 5-325 MG PO TABS: 1.0000 | ORAL_TABLET | Freq: Four times a day (QID) | ORAL | Status: DC | PRN

## 2013-08-16 NOTE — Telephone Encounter (Signed)
Okay to refill? 

## 2013-08-16 NOTE — Telephone Encounter (Signed)
Norco 5/325 mg 1 q6 hours prn pain #20

## 2013-08-16 NOTE — Telephone Encounter (Signed)
Pt aware.

## 2013-08-16 NOTE — Telephone Encounter (Signed)
RX called in .

## 2013-08-16 NOTE — Telephone Encounter (Signed)
Please let pt know ortho referral placed

## 2013-08-30 ENCOUNTER — Other Ambulatory Visit: Payer: Self-pay | Admitting: Physician Assistant

## 2013-08-30 NOTE — Telephone Encounter (Signed)
Route this to Dr. Jeanice Lim. Pt has seen her past couple OVs. LOV with her they discussed this. Currently only on 50mg  of Zoloft and Dr Rhona Raider note says using xanax sparingly. (she is using 1mg  TID) and what I think I recall is that she had recently changed SSRIs sec to adv effects etc

## 2013-08-30 NOTE — Telephone Encounter (Signed)
Last OV for this 05/18/13.  Last refill 07/29/13  OK refill??

## 2013-08-31 NOTE — Telephone Encounter (Signed)
Please let pt know since she is on the zoloft for past couple of months, i recommend decreasing the xanax to twice a day  Okay to send script- change to xanax 1mg  BID #60, Refill 1

## 2013-08-31 NOTE — Telephone Encounter (Signed)
RX called in.  Pt made aware of med change.

## 2013-09-05 ENCOUNTER — Ambulatory Visit (INDEPENDENT_AMBULATORY_CARE_PROVIDER_SITE_OTHER): Payer: BC Managed Care – PPO | Admitting: Physician Assistant

## 2013-09-05 ENCOUNTER — Encounter: Payer: Self-pay | Admitting: Physician Assistant

## 2013-09-05 VITALS — BP 112/78 | HR 76 | Temp 98.1°F | Resp 18 | Wt 162.0 lb

## 2013-09-05 DIAGNOSIS — B351 Tinea unguium: Secondary | ICD-10-CM

## 2013-09-05 MED ORDER — TERBINAFINE HCL 250 MG PO TABS: 250.0000 mg | ORAL_TABLET | Freq: Every day | ORAL | Status: DC

## 2013-09-05 NOTE — Progress Notes (Signed)
Patient ID: Sandra Parker MRN: [id number], DOB: 09-14-1976, 37 y.o. Date of Encounter: 09/05/2013, 5:27 PM    Chief Complaint:  Chief Complaint  Patient presents with  . c/o toe fungus     HPI: 37 y.o. year old female here for evaluation of her toenails. She thinks that she has toenail fungus on 3 of her toes. His toenails are thick and white opaque colored. He has no other complaints. She has not been on medications for this.  Home Meds: See attached medication section for any medications that were entered at today's visit. The computer does not put those onto this list.The following list is a list of meds entered prior to today's visit.   Current Outpatient Prescriptions on File Prior to Visit  Medication Sig Dispense Refill  . ALPRAZolam (XANAX) 1 MG tablet Take 1 tablet (1 mg total) by mouth 2 (two) times daily as needed for sleep.  60 tablet  1  . estradiol (ESTRACE) 1 MG tablet Take 1 mg by mouth daily. Take 1 1/2 tablets daily      . HYDROcodone-acetaminophen (NORCO) 5-325 MG per tablet Take 1 tablet by mouth every 6 (six) hours as needed for pain.  20 tablet  0  . Multiple Vitamin (MULTIVITAMIN) tablet Take 1 tablet by mouth daily. One-a-day      . sertraline (ZOLOFT) 50 MG tablet TAKE ONE TABLET BY MOUTH DAILY.  30 tablet  4  . naproxen (NAPROSYN) 500 MG tablet Take 1 tablet (500 mg total) by mouth 2 (two) times daily with a meal.  60 tablet  1   No current facility-administered medications on file prior to visit.    Allergies:  Allergies  Allergen Reactions  . Ciprofloxacin   . Prozac [Fluoxetine] Other (See Comments)    Bad headaches  . Relafen [Nabumetone]   . Wellbutrin [Bupropion]     Bad dreams,  Feel crazy  . Cephalexin Rash      Review of Systems: See HPI for pertinent ROS. All other ROS negative.    Physical Exam: Blood pressure 112/78, pulse 76, temperature 98.1 F (36.7 C), temperature source Oral, resp. rate 18, weight 162 lb (73.483 kg)., Body mass  index is 30.63 kg/(m^2). General: Otherwise well-developed white female. Appears in no acute distress.  Lungs: Clear bilaterally to auscultation without wheezes, rales, or rhonchi. Breathing is unlabored. Heart: Regular rhythm. No murmurs, rubs, or gallops. Msk:  Strength and tone normal for age. Extremities/Skin: On her left foot: The first and third toenails and areas of thickening and a page yellowish coloration. On her right foot: The first toe nail has area of opague coloration and thickness. Neuro: Alert and oriented X 3. Moves all extremities spontaneously. Gait is normal. CNII-XII grossly in tact. Psych:  Responds to questions appropriately with a normal affect.     ASSESSMENT AND PLAN:  37 y.o. year old female with  1. Onychomycosis of toenail Discussed with patient that have to use oral medications to treat this. Discussed that the medication does through the liver and can affect the liver. Discussed that we have to check liver function test today to document this is normal prior to medication. Then we'll need to repeat liver function tests while on medication after being on the medication for approximately 4 weeks. She is agreeable to return to the clinic to recheck these labs. He understands and accepts the possible risk of the medication and wishes to proceed. - Hepatic function panel - Hepatic function panel; Future -  terbinafine (LAMISIL) 250 MG tablet; Take 1 tablet (250 mg total) by mouth daily.  Dispense: 30 tablet; Refill: 2   Signed, [address] [place], [place], [facility] 09/05/2013 5:27 PM

## 2013-09-06 ENCOUNTER — Telehealth: Payer: Self-pay | Admitting: Family Medicine

## 2013-09-06 LAB — HEPATIC FUNCTION PANEL
ALT: 50 U/L — ABNORMAL HIGH (ref 0–35)
AST: 33 U/L (ref 0–37)
Bilirubin, Direct: 0.1 mg/dL (ref 0.0–0.3)
Indirect Bilirubin: 0.4 mg/dL (ref 0.0–0.9)
Total Protein: 7.3 g/dL (ref 6.0–8.3)

## 2013-09-06 NOTE — Telephone Encounter (Signed)
Pt called to report has broken out in hives.  Just started two new medication yesterday.  Put on Lamisil here for fungus on feet.   Then went to dentist who gave her Acyclovir for sores in mouth.  Doesn't know which she is reacting to.  Says she is going to stop the Acyclovir and see if hives continue.  Told her if hives go away call dentist and see what he recommends.  If hives continue, call us back.

## 2013-09-06 NOTE — Telephone Encounter (Signed)
Or, she may want to stop both of them, take Benadryl orally.  Let hives resolve. Then, add back one medication at a time.

## 2013-09-07 ENCOUNTER — Encounter: Payer: Self-pay | Admitting: Family Medicine

## 2013-09-07 NOTE — Telephone Encounter (Signed)
.  Patient aware and will use Benadryl and OTC Hydrocortisone cream for itch

## 2013-09-14 ENCOUNTER — Telehealth: Payer: Self-pay | Admitting: Physician Assistant

## 2013-09-14 NOTE — Telephone Encounter (Signed)
Pt wants to know if you can increase her Zoloft, she says she knows that it suppose to work in the system but wants increase

## 2013-09-15 NOTE — Telephone Encounter (Signed)
Currently only on Zoloft 50 mg. Would be okay to increase the doses that we need to document her current symptoms. Please get some history from her when you call her back so that we can have that documented. Tell her we will increase the dose to 100 mg that she needs to make an appointment to followup in the next 2-3 months. Send prescription for Zoloft 100 mg #30 with 3 refills.

## 2013-09-16 MED ORDER — SERTRALINE HCL 100 MG PO TABS: 100.0000 mg | ORAL_TABLET | Freq: Every day | ORAL | Status: DC

## 2013-09-16 NOTE — Telephone Encounter (Signed)
Pt aware and order put in

## 2013-09-16 NOTE — Addendum Note (Signed)
Addended by: Elvina Mattes T on: 09/16/2013 02:27 PM   Modules accepted: Orders

## 2013-09-19 ENCOUNTER — Other Ambulatory Visit: Payer: Self-pay | Admitting: Physician Assistant

## 2013-09-19 NOTE — Telephone Encounter (Signed)
Medication refilled per protocol. 

## 2013-09-26 ENCOUNTER — Other Ambulatory Visit: Payer: Self-pay | Admitting: Family Medicine

## 2013-09-27 NOTE — Telephone Encounter (Signed)
Last refill 08/30/13.  Last OV 07/20/13  OK refill?

## 2013-09-27 NOTE — Telephone Encounter (Signed)
Please send to PCP- Shon Hale, has been on med prior to my arrival. See if she wants to continue her on this.

## 2013-09-28 NOTE — Telephone Encounter (Signed)
rx called in

## 2013-09-28 NOTE — Telephone Encounter (Signed)
Approved for #60+2 refills 

## 2013-10-03 ENCOUNTER — Encounter: Payer: Self-pay | Admitting: Physician Assistant

## 2013-10-03 ENCOUNTER — Ambulatory Visit (INDEPENDENT_AMBULATORY_CARE_PROVIDER_SITE_OTHER): Payer: BC Managed Care – PPO | Admitting: Physician Assistant

## 2013-10-03 VITALS — BP 104/78 | HR 88 | Temp 98.4°F | Resp 18 | Wt 161.0 lb

## 2013-10-03 DIAGNOSIS — R1013 Epigastric pain: Secondary | ICD-10-CM

## 2013-10-03 DIAGNOSIS — R110 Nausea: Secondary | ICD-10-CM

## 2013-10-03 DIAGNOSIS — R141 Gas pain: Secondary | ICD-10-CM

## 2013-10-03 DIAGNOSIS — R140 Abdominal distension (gaseous): Secondary | ICD-10-CM

## 2013-10-03 MED ORDER — OMEPRAZOLE 20 MG PO CPDR: 20.0000 mg | DELAYED_RELEASE_CAPSULE | Freq: Every day | ORAL | Status: DC

## 2013-10-03 NOTE — Progress Notes (Signed)
Patient ID: EDLA PARA MRN: [id number], DOB: 1976-10-15, 37 y.o. Date of Encounter: @DATE @  Chief Complaint:  Chief Complaint  Patient presents with  . c/o stomach issues    feels very bloated and distended x 2 weeks, using Miralax w/o releif    HPI: 37 y.o. year old female  presents for evaluation of abdominal issues.   She says she's had these symptoms for the past 2 weeks. Feels very bloated with some mild nausea. Says that she has only vomited twice. Says that those episodes she ate and then about 20 minutes later had bowel movements and after that developed nausea and vomited. At this visit included in the vomiting happened like this and is the only episode of vomiting she has had. However over the last couple weeks she has had multiple episodes where after she eats she feels pain in her epigastric area. However she says today she really hasn't eaten much at all and still has pain in epigastric area and feels bloated. Says that yesterday she just ate a piece of whole week his pain all day after that continued with pain in the epigastric area and bloating.  She has had no diarrhea. She says it always since being a small child she has sometimes gone days between having a bowel movement. However says that when she does go to the bathroom and it is soft. Says that she may have to strain a little bit but even then it is soft and not hard. Even recently she has had no problems with constipation. As that her BMs have been the same as usual.  She has no known history of H. Pylori. She does have a history of cholecystectomy. She does have Celebrex available to take as needed for shoulder pain. However she says that she has not been taking much of that recently and really has had none in the past 2 weeks. Uses no other anti-inflammatories. Drinks no to minimal alcohol.   Past Medical History  Diagnosis Date  . BV (bacterial vaginosis)   . Anal fissure   . Constipation   . Atrophic  vaginitis   . Anxiety   . Depression      Home Meds: See attached medication section for current medication list. Any medications entered into computer today will not appear on this note's list. The medications listed below were entered prior to today. Current Outpatient Prescriptions on File Prior to Visit  Medication Sig Dispense Refill  . ALPRAZolam (XANAX) 1 MG tablet TAKE (1) TABLET BY MOUTH (2) TIMES DAILY AS NEEDED.  60 tablet  2  . celecoxib (CELEBREX) 200 MG capsule Take 200 mg by mouth daily.      Marland Kitchen estradiol (ESTRACE) 1 MG tablet TAKE 1 & 1/2 TABLET BY MOUTH DAILY.  45 tablet  3  . HYDROcodone-acetaminophen (NORCO) 5-325 MG per tablet Take 1 tablet by mouth every 6 (six) hours as needed for pain.  20 tablet  0  . Multiple Vitamin (MULTIVITAMIN) tablet Take 1 tablet by mouth daily. One-a-day      . terbinafine (LAMISIL) 250 MG tablet Take 1 tablet (250 mg total) by mouth daily.  30 tablet  2  . naproxen (NAPROSYN) 500 MG tablet Take 1 tablet (500 mg total) by mouth 2 (two) times daily with a meal.  60 tablet  1  . sertraline (ZOLOFT) 100 MG tablet Take 1 tablet (100 mg total) by mouth daily.  30 tablet  3   No current facility-administered medications  on file prior to visit.    Allergies:  Allergies  Allergen Reactions  . Ciprofloxacin   . Prozac [Fluoxetine] Other (See Comments)    Bad headaches  . Relafen [Nabumetone]   . Wellbutrin [Bupropion]     Bad dreams,  Feel crazy  . Cephalexin Rash    History   Social History  . Marital Status: Divorced    Spouse Name: N/A    Number of Children: N/A  . Years of Education: N/A   Occupational History  . Not on file.   Social History Main Topics  . Smoking status: Never Smoker   . Smokeless tobacco: Never Used  . Alcohol Use: Yes  . Drug Use: No  . Sexual Activity: Not on file   Other Topics Concern  . Not on file   Social History Narrative  . No narrative on file    No family history on file.   Review of  Systems:  See HPI for pertinent ROS. All other ROS negative.    Physical Exam: Blood pressure 104/78, pulse 88, temperature 98.4 F (36.9 C), temperature source Oral, resp. rate 18, weight 161 lb (73.029 kg)., Body mass index is 30.44 kg/(m^2). General: Well-nourished well-developed white female . Does not appear ill .Appears in no acute distress. Lungs: Clear bilaterally to auscultation without wheezes, rales, or rhonchi. Breathing is unlabored. Heart: RRR with S1 S2. No murmurs, rubs, or gallops. Abdomen: Normal bowel sounds throughout. She does have tenderness with palpation of the epigastric area. No other areas of tenderness. No tenderness in the right upper quadrant at all only midline epigastric. No mass or organomegaly. No guarding or rebound tenderness . Musculoskeletal:  Strength and tone normal for age. Extremities/Skin: Warm and dry. No clubbing or cyanosis. No edema. No rashes or suspicious lesions. Neuro: Alert and oriented X 3. Moves all extremities spontaneously. Gait is normal. CNII-XII grossly in tact. Psych:  Responds to questions appropriately with a normal affect.     ASSESSMENT AND PLAN:  37 y.o. year old female with  1. Abdominal pain, epigastric - omeprazole (PRILOSEC) 20 MG capsule; Take 1 capsule (20 mg total) by mouth daily.  Dispense: 30 capsule; Refill: 3 - CBC with Differential - COMPLETE METABOLIC PANEL WITH GFR - TSH - Helicobacter pylori abs-IgG+IgA, bld - Amylase - Lipase  2. Nausea - omeprazole (PRILOSEC) 20 MG capsule; Take 1 capsule (20 mg total) by mouth daily.  Dispense: 30 capsule; Refill: 3 - CBC with Differential - COMPLETE METABOLIC PANEL WITH GFR - TSH - Helicobacter pylori abs-IgG+IgA, bld - Amylase - Lipase  3. Abdominal bloating - CBC with Differential - COMPLETE METABOLIC PANEL WITH GFR - TSH - Helicobacter pylori abs-IgG+IgA, bld - Amylase - Lipase  I offered prescription for Phenergan but she says that the nausea is not  that bad. Told her to stay off of the Celebrex and avoid NSAIDs. Start omeprazole daily.  Check labs then  followup with pt once we get lab results.   [address] [place], [place], [facility] 10/03/2013 4:23 PM

## 2013-10-04 LAB — CBC WITH DIFFERENTIAL/PLATELET
Basophils Relative: 1 % (ref 0–1)
Eosinophils Relative: 2 % (ref 0–5)
HCT: 38 % (ref 36.0–46.0)
Hemoglobin: 13.2 g/dL (ref 12.0–15.0)
Lymphocytes Relative: 35 % (ref 12–46)
Lymphs Abs: 3.1 10*3/uL (ref 0.7–4.0)
MCHC: 34.7 g/dL (ref 30.0–36.0)
MCV: 84.1 fL (ref 78.0–100.0)
Monocytes Absolute: 0.5 10*3/uL (ref 0.1–1.0)
Monocytes Relative: 5 % (ref 3–12)
Neutro Abs: 5.1 10*3/uL (ref 1.7–7.7)
Neutrophils Relative %: 57 % (ref 43–77)
WBC: 8.9 10*3/uL (ref 4.0–10.5)

## 2013-10-04 LAB — COMPLETE METABOLIC PANEL WITH GFR
Albumin: 4.6 g/dL (ref 3.5–5.2)
BUN: 12 mg/dL (ref 6–23)
Calcium: 9.7 mg/dL (ref 8.4–10.5)
Chloride: 102 mEq/L (ref 96–112)
GFR, Est Non African American: >89 mL/min
Glucose, Bld: 75 mg/dL (ref 70–99)
Potassium: 4.8 mEq/L (ref 3.5–5.3)

## 2013-10-04 LAB — HELICOBACTER PYLORI ABS-IGG+IGA, BLD: HELICOBACTER PYLORI AB, IGA: 1.9 U/mL (ref <9.0)

## 2013-10-04 LAB — TSH: TSH: 1.171 u[IU]/mL (ref 0.350–4.500)

## 2013-10-05 ENCOUNTER — Telehealth: Payer: Self-pay | Admitting: Family Medicine

## 2013-10-05 NOTE — Telephone Encounter (Signed)
Message copied by Donne Anon on Wed Oct 05, 2013  2:21 PM ------      Message from: Allayne Butcher      Created: Wed Oct 05, 2013  1:12 PM       The patient that all labs are completely normal. This includes a CBC. White count is normal so no indication of bacterial infection. Hemoglobin is normal so no anemia. CMET normal : Electrolytes and liver numbers are normal. Thyroid normal. Amylase and lipase are normal so no sign of pancreatitis. H. pylori test is negative.      She is already on omeprazole on her medicine list. Tell her to make sure to take this daily.       Make sure she did start taking a probiotic as I discussed at the office visit. Take this every day.      She said that her bowel habits really are not changed compared to her usual. However she says that she often does go several days between bms. Tell her to start using MiraLax one capful per day for the next 2 weeks.       See if her symptoms improve with this. If symptoms worsen in the meantime, call me.  Otherwise give it for 2 weeks and then he can followup.       ------

## 2013-10-05 NOTE — Telephone Encounter (Signed)
Pt aware of lab results and provider recommendations.  To follow up in two weeks, sooner if worse.

## 2013-10-31 ENCOUNTER — Ambulatory Visit: Payer: BC Managed Care – PPO | Admitting: Physician Assistant

## 2013-11-08 ENCOUNTER — Ambulatory Visit (INDEPENDENT_AMBULATORY_CARE_PROVIDER_SITE_OTHER): Payer: BC Managed Care – PPO | Admitting: Family Medicine

## 2013-11-08 VITALS — BP 120/78 | HR 82 | Temp 98.3°F | Resp 18 | Ht 60.0 in | Wt 163.0 lb

## 2013-11-08 DIAGNOSIS — F419 Anxiety disorder, unspecified: Secondary | ICD-10-CM

## 2013-11-08 DIAGNOSIS — M25519 Pain in unspecified shoulder: Secondary | ICD-10-CM

## 2013-11-08 DIAGNOSIS — F3289 Other specified depressive episodes: Secondary | ICD-10-CM

## 2013-11-08 DIAGNOSIS — F411 Generalized anxiety disorder: Secondary | ICD-10-CM

## 2013-11-08 DIAGNOSIS — F329 Major depressive disorder, single episode, unspecified: Secondary | ICD-10-CM

## 2013-11-08 DIAGNOSIS — F32A Depression, unspecified: Secondary | ICD-10-CM

## 2013-11-08 MED ORDER — PAROXETINE HCL 10 MG PO TABS: 10.0000 mg | ORAL_TABLET | Freq: Every day | ORAL | Status: DC

## 2013-11-08 NOTE — Patient Instructions (Signed)
Start paxil once a day Continue xanax F/U in January

## 2013-11-09 ENCOUNTER — Encounter: Payer: Self-pay | Admitting: Family Medicine

## 2013-11-09 NOTE — Progress Notes (Signed)
  Subjective:    Patient ID: Sandra Parker, female    DOB: 13-Apr-1976, 37 y.o.   MRN: [id number]  HPI  Patient here to followup mood. She was previously maintained on Zoloft 150 mg and Xanax. She felt that the medication was not working therefore she discontinued them about one to 2 months ago. She states that her stress is high and she is very snappy and easy to anger especially towards her children. She's not sleeping well either. She is a very stressful job. She often cries and gets very overwhelmed. In the past she has been on multiple medications including Wellbutrin, Prozac which helped but gave her headaches she was on this for about 10 years. She was also on Effexor which did not help.  She also was recently evaluated by plastic surgery regarding breast reduction was told that she would need further documentation from her primary Dr. to help assist her insurance company with the process. She currently wears a 38 triple D. and has difficulty with upper and mid back pain due to her breast size. She also has large indentations in her shoulders from her breast straps.   Review of Systems - per above  GEN- denies fatigue, fever, weight loss,weakness, recent illness HEENT- denies eye drainage, change in vision, nasal discharge, CVS- denies chest pain, palpitations RESP- denies SOB, cough, wheeze MSK- +joint pain, muscle aches, injury Neuro- denies headache, dizziness, syncope, seizure activity      Objective:   Physical Exam  GEN-NAD,alert and oriented x 3 Psych- tearful discussed symptoms, not overly depressed or anxious appearing, well groomed, no SI, no hallucinations Chest- large chest , with indention of bra straps into bilateral shoulders Poor posture with slumping at shoulders when she sits      Assessment & Plan:

## 2013-11-09 NOTE — Assessment & Plan Note (Signed)
Per above we will start Paxil

## 2013-11-09 NOTE — Assessment & Plan Note (Signed)
Shoulder discomfort and upper back due to large breast She would benefit from reductions

## 2013-11-09 NOTE — Assessment & Plan Note (Signed)
Will start her on Paxil 10 mg daily as she did do well with Prozac but had side effects of the headaches. We will titrate from there. She will continue her Xanax

## 2013-11-29 ENCOUNTER — Encounter: Payer: Self-pay | Admitting: Family Medicine

## 2013-11-29 ENCOUNTER — Ambulatory Visit (INDEPENDENT_AMBULATORY_CARE_PROVIDER_SITE_OTHER): Payer: BC Managed Care – PPO | Admitting: Family Medicine

## 2013-11-29 VITALS — BP 108/78 | HR 68 | Temp 98.1°F | Resp 16 | Ht 60.0 in | Wt 162.0 lb

## 2013-11-29 DIAGNOSIS — J069 Acute upper respiratory infection, unspecified: Secondary | ICD-10-CM

## 2013-11-29 HISTORY — DX: Acute upper respiratory infection, unspecified: J06.9

## 2013-11-29 MED ORDER — GUAIFENESIN-CODEINE 100-10 MG/5ML PO SOLN: 10.0000 mL | Freq: Four times a day (QID) | ORAL | Status: DC | PRN

## 2013-11-29 NOTE — Progress Notes (Signed)
   Subjective:    Patient ID: Sandra Parker, female    DOB: 09-30-1976, 37 y.o.   MRN: [id number]  HPI  Patient here with cough body aches congestion for the past 4 days. She also had a fever last night to 102F. She's been taken NyQuil and TheraFlu over-the-counter with minimal relief. Denies any sick contacts. She's not had any shortness of breath nausea vomiting. Cough is the worst part of illness.  Note she has been taking the Paxil and states that she sees improvement in her mood with the medication  Review of Systems  GEN- denies fatigue,+ fever, weight loss,weakness, recent illness HEENT- denies eye drainage, change in vision,+ nasal discharge, CVS- denies chest pain, palpitations RESP- denies SOB, +cough, wheeze ABD- denies N/V, change in stools, abd pain Neuro- denies headache, dizziness, syncope, seizure activity      Objective:   Physical Exam   GEN- NAD, alert and oriented x3 HEENT- PERRL, EOMI, non injected sclera, pink conjunctiva, MMM, oropharynx clear, TM clear bilat no effusion, no sinus tenderness, nares clear rhinorhrea  Neck- Supple, no LAD CVS- RRR, no murmur RESP-CTAB EXT- No edema Pulses- Radial 2+        Assessment & Plan:

## 2013-11-29 NOTE — Patient Instructions (Signed)
Robitussin with codeine at bedtime Mucinex DM during the day  Call if not better Upper Respiratory Infection, Adult An upper respiratory infection (URI) is also sometimes known as the common cold. The upper respiratory tract includes the nose, sinuses, throat, trachea, and bronchi. Bronchi are the airways leading to the lungs. Most people improve within 1 week, but symptoms can last up to 2 weeks. A residual cough may last even longer.  CAUSES Many different viruses can infect the tissues lining the upper respiratory tract. The tissues become irritated and inflamed and often become very moist. Mucus production is also common. A cold is contagious. You can easily spread the virus to others by oral contact. This includes kissing, sharing a glass, coughing, or sneezing. Touching your mouth or nose and then touching a surface, which is then touched by another person, can also spread the virus. SYMPTOMS  Symptoms typically develop 1 to 3 days after you come in contact with a cold virus. Symptoms vary from person to person. They may include:  Runny nose.  Sneezing.  Nasal congestion.  Sinus irritation.  Sore throat.  Loss of voice (laryngitis).  Cough.  Fatigue.  Muscle aches.  Loss of appetite.  Headache.  Low-grade fever. DIAGNOSIS  You might diagnose your own cold based on familiar symptoms, since most people get a cold 2 to 3 times a year. Your caregiver can confirm this based on your exam. Most importantly, your caregiver can check that your symptoms are not due to another disease such as strep throat, sinusitis, pneumonia, asthma, or epiglottitis. Blood tests, throat tests, and X-rays are not necessary to diagnose a common cold, but they may sometimes be helpful in excluding other more serious diseases. Your caregiver will decide if any further tests are required. RISKS AND COMPLICATIONS  You may be at risk for a more severe case of the common cold if you smoke cigarettes, have  chronic heart disease (such as heart failure) or lung disease (such as asthma), or if you have a weakened immune system. The very young and very old are also at risk for more serious infections. Bacterial sinusitis, middle ear infections, and bacterial pneumonia can complicate the common cold. The common cold can worsen asthma and chronic obstructive pulmonary disease (COPD). Sometimes, these complications can require emergency medical care and may be life-threatening. PREVENTION  The best way to protect against getting a cold is to practice good hygiene. Avoid oral or hand contact with people with cold symptoms. Wash your hands often if contact occurs. There is no clear evidence that vitamin C, vitamin E, echinacea, or exercise reduces the chance of developing a cold. However, it is always recommended to get plenty of rest and practice good nutrition. TREATMENT  Treatment is directed at relieving symptoms. There is no cure. Antibiotics are not effective, because the infection is caused by a virus, not by bacteria. Treatment may include:  Increased fluid intake. Sports drinks offer valuable electrolytes, sugars, and fluids.  Breathing heated mist or steam (vaporizer or shower).  Eating chicken soup or other clear broths, and maintaining good nutrition.  Getting plenty of rest.  Using gargles or lozenges for comfort.  Controlling fevers with ibuprofen or acetaminophen as directed by your caregiver.  Increasing usage of your inhaler if you have asthma. Zinc gel and zinc lozenges, taken in the first 24 hours of the common cold, can shorten the duration and lessen the severity of symptoms. Pain medicines may help with fever, muscle aches, and throat pain.  A variety of non-prescription medicines are available to treat congestion and runny nose. Your caregiver can make recommendations and may suggest nasal or lung inhalers for other symptoms.  HOME CARE INSTRUCTIONS   Only take over-the-counter or  prescription medicines for pain, discomfort, or fever as directed by your caregiver.  Use a warm mist humidifier or inhale steam from a shower to increase air moisture. This may keep secretions moist and make it easier to breathe.  Drink enough water and fluids to keep your urine clear or pale yellow.  Rest as needed.  Return to work when your temperature has returned to normal or as your caregiver advises. You may need to stay home longer to avoid infecting others. You can also use a face mask and careful hand washing to prevent spread of the virus. SEEK MEDICAL CARE IF:   After the first few days, you feel you are getting worse rather than better.  You need your caregiver's advice about medicines to control symptoms.  You develop chills, worsening shortness of breath, or brown or red sputum. These may be signs of pneumonia.  You develop yellow or brown nasal discharge or pain in the face, especially when you bend forward. These may be signs of sinusitis.  You develop a fever, swollen neck glands, pain with swallowing, or white areas in the back of your throat. These may be signs of strep throat. SEEK IMMEDIATE MEDICAL CARE IF:   You have a fever.  You develop severe or persistent headache, ear pain, sinus pain, or chest pain.  You develop wheezing, a prolonged cough, cough up blood, or have a change in your usual mucus (if you have chronic lung disease).  You develop sore muscles or a stiff neck. Document Released: 06/10/2001 Document Revised: 03/08/2012 Document Reviewed: 04/18/2011 [facility] Patient Information 2014 [place], [place].

## 2013-11-29 NOTE — Assessment & Plan Note (Signed)
Supportive care for viral illness. I do not think antibiotics are worse at this time. I have given her prescription for Robitussin with codeine to help with the cough at bedtime. Note was given for work for today

## 2013-12-27 ENCOUNTER — Other Ambulatory Visit: Payer: Self-pay | Admitting: Physician Assistant

## 2013-12-27 NOTE — Telephone Encounter (Signed)
Last RF 9/29 #60  +2 .  Last OV for this 11/22  OK refill?

## 2013-12-27 NOTE — Telephone Encounter (Signed)
Okay to refill? 

## 2014-01-04 ENCOUNTER — Telehealth: Payer: Self-pay | Admitting: *Deleted

## 2014-01-04 MED ORDER — BENZONATATE 100 MG PO CAPS: 200.0000 mg | ORAL_CAPSULE | Freq: Three times a day (TID) | ORAL | Status: DC | PRN

## 2014-01-04 MED ORDER — AZITHROMYCIN 250 MG PO TABS: ORAL_TABLET | ORAL | Status: DC

## 2014-01-04 NOTE — Telephone Encounter (Signed)
I reviewed chart. She saw Dr. Buelah Manis on 11/29/13 and was diagnosed with viral URI. If she still has drainage and cough then I think this is a bacterial infection rather than viral. Therefore would need antibiotic. Send prescription for: Azithromycin 250 mg    Day 1: Take 2 daily.   Days 2 through 5: Take one daily Dispense #6 (1 pack)  0 refill Can also send in prescription for a cough suppressant: Tessalon 200 mg one by mouth every 12 hour when necessary

## 2014-01-04 NOTE — Telephone Encounter (Signed)
Meds refilled.

## 2014-01-04 NOTE — Telephone Encounter (Signed)
Pt was seen 2 weeks ago with sinus drainage,cough and says its not getting any better, wants to know if you can give her something else to help relief her cough

## 2014-01-23 ENCOUNTER — Encounter: Payer: Self-pay | Admitting: Physician Assistant

## 2014-01-23 ENCOUNTER — Ambulatory Visit (INDEPENDENT_AMBULATORY_CARE_PROVIDER_SITE_OTHER): Payer: BC Managed Care – PPO | Admitting: Physician Assistant

## 2014-01-23 VITALS — BP 112/80 | HR 76 | Temp 98.6°F | Resp 18 | Wt 167.0 lb

## 2014-01-23 DIAGNOSIS — J988 Other specified respiratory disorders: Secondary | ICD-10-CM

## 2014-01-23 DIAGNOSIS — B9689 Other specified bacterial agents as the cause of diseases classified elsewhere: Secondary | ICD-10-CM

## 2014-01-23 DIAGNOSIS — A499 Bacterial infection, unspecified: Secondary | ICD-10-CM

## 2014-01-23 MED ORDER — DOXYCYCLINE HYCLATE 100 MG PO TABS: 100.0000 mg | ORAL_TABLET | Freq: Two times a day (BID) | ORAL | Status: DC

## 2014-01-23 NOTE — Progress Notes (Signed)
Patient ID: Sandra Parker MRN: [id number], DOB: July 31, 1976, 38 y.o. Date of Encounter: 01/23/2014, 11:57 AM    Chief Complaint:  Chief Complaint  Patient presents with  . sick    bad chest cong, cough, body aches x 3 weeks     HPI: 38 y.o. year old female by Dr. Buelah Manis for an office visit on 11/29/13 and was diagnosed with a viral respiratory infection. She called back on 01/04/14 saying that she was still sick. Was prescribed Z-Pak. States that she did complete this but says that she never got any better even while she was on antibiotics and even after they were completed. As to the nasal bridge and says that that area feels congested and stopped up. Is that she is blowing very little out of her nose but says that it feels it is draining down her throat and stead. Complains of chest congestion and cough. Has had no fevers or chills. Throat feels irritated but not really sore. No ear ache.     Home Meds: See attached medication section for any medications that were entered at today's visit. The computer does not put those onto this list.The following list is a list of meds entered prior to today's visit.   Current Outpatient Prescriptions on File Prior to Visit  Medication Sig Dispense Refill  . ALPRAZolam (XANAX) 1 MG tablet TAKE (1) TABLET BY MOUTH (2) TIMES DAILY AS NEEDED.  60 tablet  0  . benzonatate (TESSALON) 100 MG capsule Take 2 capsules (200 mg total) by mouth 3 (three) times daily as needed for cough.  30 capsule  0  . estradiol (ESTRACE) 1 MG tablet TAKE 1 & 1/2 TABLET BY MOUTH DAILY.  45 tablet  3  . Multiple Vitamin (MULTIVITAMIN) tablet Take 1 tablet by mouth daily. One-a-day      . omeprazole (PRILOSEC) 20 MG capsule Take 1 capsule (20 mg total) by mouth daily.  30 capsule  3  . PARoxetine (PAXIL) 10 MG tablet Take 1 tablet (10 mg total) by mouth daily.  30 tablet  2  . azithromycin (ZITHROMAX) 250 MG tablet Take 2 tablets on Day 1, then take 1 tablet on Days 2-5  6 each  0   . guaiFENesin-codeine 100-10 MG/5ML syrup Take 10 mLs by mouth every 6 (six) hours as needed for cough.  180 mL  0   No current facility-administered medications on file prior to visit.    Allergies:  Allergies  Allergen Reactions  . Ciprofloxacin   . Prozac [Fluoxetine] Other (See Comments)    Bad headaches  . Relafen [Nabumetone]   . Wellbutrin [Bupropion]     Bad dreams,  Feel crazy  . Cephalexin Rash      Review of Systems: See HPI for pertinent ROS. All other ROS negative.    Physical Exam: Blood pressure 112/80, pulse 76, temperature 98.6 F (37 C), temperature source Oral, resp. rate 18, weight 167 lb (75.751 kg)., Body mass index is 32.62 kg/(m^2). General:  WF. Appears in no acute distress. HEENT: Normocephalic, atraumatic, eyes without discharge, sclera non-icteric, nares are without discharge. Bilateral auditory canals clear, TM's are without perforation, pearly grey and translucent with reflective cone of light bilaterally. Oral cavity moist, posterior pharynx without exudate, erythema, peritonsillar abscess. No tenderness with percussion of frontal and maxillary sinuses.  Neck: Supple. No thyromegaly. No lymphadenopathy. Lungs: Clear bilaterally to auscultation without wheezes, rales, or rhonchi. Breathing is unlabored. Lungs are clear throughout with good air movement  and breath sounds. No wheeze. Heart: Regular rhythm. No murmurs, rubs, or gallops. Msk:  Strength and tone normal for age. Extremities/Skin: Warm and dry. No clubbing or cyanosis. No edema. No rashes or suspicious lesions. Neuro: Alert and oriented X 3. Moves all extremities spontaneously. Gait is normal. CNII-XII grossly in tact. Psych:  Responds to questions appropriately with a normal affect.     ASSESSMENT AND PLAN:  38 y.o. year old female with  1. Bacterial respiratory infection He recently took Z-Pak with no improvement. Will not use Omnicef she is allergic to cephalexin. - doxycycline  (VIBRA-TABS) 100 MG tablet; Take 1 tablet (100 mg total) by mouth 2 (two) times daily.  Dispense: 20 tablet; Refill: 0 Complete all of the antibiotic. If symptoms do not resolve after completion of antibiotic and followup. He had already prescribed Tessalon to use as needed for cough suppressant. She says that today is her only day that she has missed work. Will give note for out of work for today.  Signed, [address] [place], [place], [facility] 01/23/2014 11:57 AM

## 2014-03-06 ENCOUNTER — Encounter: Payer: Self-pay | Admitting: Physician Assistant

## 2014-03-06 ENCOUNTER — Ambulatory Visit (INDEPENDENT_AMBULATORY_CARE_PROVIDER_SITE_OTHER): Payer: BC Managed Care – PPO | Admitting: Physician Assistant

## 2014-03-06 VITALS — BP 104/66 | HR 52 | Temp 98.1°F | Resp 18 | Ht 61.0 in | Wt 169.0 lb

## 2014-03-06 DIAGNOSIS — J309 Allergic rhinitis, unspecified: Secondary | ICD-10-CM

## 2014-03-06 DIAGNOSIS — M25819 Other specified joint disorders, unspecified shoulder: Secondary | ICD-10-CM

## 2014-03-06 DIAGNOSIS — M7580 Other shoulder lesions, unspecified shoulder: Secondary | ICD-10-CM

## 2014-03-06 DIAGNOSIS — M7541 Impingement syndrome of right shoulder: Secondary | ICD-10-CM

## 2014-03-06 MED ORDER — CETIRIZINE HCL 10 MG PO TABS: 10.0000 mg | ORAL_TABLET | Freq: Every day | ORAL | Status: DC

## 2014-03-07 ENCOUNTER — Telehealth: Payer: Self-pay | Admitting: Family Medicine

## 2014-03-07 NOTE — Telephone Encounter (Signed)
AGREE

## 2014-03-07 NOTE — Telephone Encounter (Signed)
Calling to states can't do exercises given.  Can't maneuver arm as it instructs on exercises.  Wants ortho referral!!  Told patient she need to try exercises to the best she can.  As she does them arm should improve.  Then wanted to know if there was something else she could take beside Motrin.  I told her Motrin is antiinflammatory and she needs to take regularly to get inflammation down, that will reduce pain.  Told her she needs to do this for at least one week.  If still not better then let us know.

## 2014-03-07 NOTE — Progress Notes (Signed)
Patient ID: Sandra Parker MRN: [id number], DOB: [birth year], 38 y.o. Date of Encounter: @DATE @  Chief Complaint:  Chief Complaint  Patient presents with  . c/o continuing congestion    hasn't completely gotten better  . c/o right shoulder pain    ? pulled muscle or strain    HPI: 38 y.o. year old female  presents with above complaints.  She was seen by Dr. Buelah Manis for office visit on 11/29/13 and diagnosed with viral respiratory infection. She called 01/04/2014 and was prescribed a Z-Pak. She had office visit with me 01/23/14 this she never got better while on the Z-Pak or even after she completed it. I prescribed doxycycline at that time. Today she says that she still feels drainage and phlegm in her throat. She is constantly having to clear her throat. Has noted mucus or drainage coming from her nose. No sinus pain. No congestion or phlegm down in her chest. No fever or chills. No sore throat or ear ache. No itchy watery eyes and nose sneezing.  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 Says that last Wednesday which was about 5 days ago she lifted a patient. Says that at that time she knew she had "pulled something"  She immediately felt discomfort in her right shoulder. Since then, her right shoulder is uncomfortable and achy when she is sleeping at night and she feels the pain there anytime she tries to abduct her right shoulder.   Past Medical History  Diagnosis Date  . Constipation   . Atrophic vaginitis   . Anxiety   . Depression      Home Meds: See attached medication section for current medication list. Any medications entered into computer today will not appear on this note's list. The medications listed below were entered prior to today. Current Outpatient Prescriptions on File Prior to Visit  Medication Sig Dispense Refill  . ALPRAZolam (XANAX) 1 MG tablet TAKE (1)  TABLET BY MOUTH (2) TIMES DAILY AS NEEDED.  60 tablet  0  . benzonatate (TESSALON) 100 MG capsule Take 2 capsules (200 mg total) by mouth 3 (three) times daily as needed for cough.  30 capsule  0  . estradiol (ESTRACE) 1 MG tablet TAKE 1 & 1/2 TABLET BY MOUTH DAILY.  45 tablet  3  . Multiple Vitamin (MULTIVITAMIN) tablet Take 1 tablet by mouth daily. One-a-day      . omeprazole (PRILOSEC) 20 MG capsule Take 1 capsule (20 mg total) by mouth daily.  30 capsule  3  . PARoxetine (PAXIL) 10 MG tablet Take 1 tablet (10 mg total) by mouth daily.  30 tablet  2   No current facility-administered medications on file prior to visit.    Allergies:  Allergies  Allergen Reactions  . Ciprofloxacin   . Prozac [Fluoxetine] Other (See Comments)    Bad headaches  . Relafen [Nabumetone]   . Wellbutrin [Bupropion]     Bad dreams,  Feel crazy  . Cephalexin Rash    History   Social History  . Marital Status: Divorced    Spouse Name: N/A    Number of Children: N/A  . Years of Education: N/A   Occupational History  . Not on file.   Social History Main Topics  . Smoking status: Never Smoker   . Smokeless tobacco: Never Used  . Alcohol Use: Yes  .  Drug Use: No  . Sexual Activity: Not on file   Other Topics Concern  . Not on file   Social History Narrative  . No narrative on file    No family history on file.   Review of Systems:  See HPI for pertinent ROS. All other ROS negative.    Physical Exam: Blood pressure 104/66, pulse 52, temperature 98.1 F (36.7 C), temperature source Oral, resp. rate 18, height 5\' 1"  (1.549 m), weight 169 lb (76.658 kg)., Body mass index is 31.95 kg/(m^2). General: WNWD WF. Appears in no acute distress. Head: Normocephalic, atraumatic, eyes without discharge, sclera non-icteric, nares are without discharge. Bilateral auditory canals clear, TM's are without perforation, pearly grey and translucent with reflective cone of light bilaterally. Oral cavity moist,  posterior pharynx without exudate, erythema, peritonsillar abscess. No tenderness with percussion of frontal and maxillary sinuses bilaterally.  Neck: Supple. No thyromegaly. No lymphadenopathy. Lungs: Clear bilaterally to auscultation without wheezes, rales, or rhonchi. Breathing is unlabored. Heart: RRR with S1 S2. No murmurs, rubs, or gallops. Musculoskeletal:  Strength and tone normal for age. Right shoulder: Positive tenderness with palpation at the anterior aspect of the shoulder joint near the a.c. Joint. 5/5 strength with forearm abduction and abduction. Empty can sign is normal. Abduction causes discomfort at the a.c. joint but she is able to almost completely abduct. Extremities/Skin: Warm and dry. Neuro: Alert and oriented X 3. Moves all extremities spontaneously. Gait is normal. CNII-XII grossly in tact. Psych:  Responds to questions appropriately with a normal affect.     ASSESSMENT AND PLAN:  38 y.o. year old female with  1. Allergic rhinitis She does not need further antibiotic. Will have her add the Zyrtec daily to try to dry up remaining drainage. - cetirizine (ZYRTEC) 10 MG tablet; Take 1 tablet (10 mg total) by mouth daily.  Dispense: 30 tablet; Refill: 11  2. Impingement syndrome of right shoulder I gave and reviewed hand out which includes posture changes and position changes as well as exercises and stretches for her to do. She is having significant discomfort she can use over-the-counter anti-inflammatories with food.    Signed, [address] [place], [place], [facility] 03/07/2014 11:47 AM

## 2014-03-08 ENCOUNTER — Telehealth: Payer: Self-pay | Admitting: Family Medicine

## 2014-03-08 ENCOUNTER — Encounter: Payer: Self-pay | Admitting: Family Medicine

## 2014-03-08 NOTE — Telephone Encounter (Signed)
Okay to give her this work note !!!

## 2014-03-08 NOTE — Telephone Encounter (Signed)
Return to work letter printed for patient

## 2014-03-08 NOTE — Telephone Encounter (Signed)
Message copied by Olena Mater on Wed Mar 08, 2014  9:47 AM ------      Message from: Lenore Manner      Created: Wed Mar 08, 2014  8:28 AM      Regarding: Work Note      Contact: [phone]       PT is needing a note for works he was out on Monday and Tuesday and went back today, she is going to stop by this afternoon to pick up the note ------

## 2014-03-27 ENCOUNTER — Telehealth: Payer: Self-pay | Admitting: *Deleted

## 2014-03-27 ENCOUNTER — Other Ambulatory Visit: Payer: Self-pay | Admitting: Physician Assistant

## 2014-03-27 NOTE — Telephone Encounter (Signed)
Pt called stating that she wants to be switched back to prozac from paxil(stating not working), says she will have to deal with the h/a that prozac gives her. Please advise!!

## 2014-03-28 NOTE — Telephone Encounter (Signed)
That is ok. Can send prescription for Prozac 20 mg 1 by mouth daily #30 with  3 refills.

## 2014-03-29 MED ORDER — FLUOXETINE HCL 20 MG PO TABS: 20.0000 mg | ORAL_TABLET | Freq: Every day | ORAL | Status: DC

## 2014-03-29 NOTE — Telephone Encounter (Signed)
Med phoned in °

## 2014-03-29 NOTE — Telephone Encounter (Signed)
Last RF 12/30 #60  OK refill?

## 2014-03-30 NOTE — Telephone Encounter (Signed)
RX called in .

## 2014-03-30 NOTE — Telephone Encounter (Signed)
Approved for # 60 + 0 

## 2014-04-26 ENCOUNTER — Other Ambulatory Visit: Payer: Self-pay | Admitting: Physician Assistant

## 2014-04-26 NOTE — Telephone Encounter (Signed)
Called patient and told needs to make appt.

## 2014-04-26 NOTE — Telephone Encounter (Signed)
last RF 03/30/14 #60

## 2014-04-26 NOTE — Telephone Encounter (Signed)
She needs to schedule an office visit. Last office visit regarding anxiety and depression was in November. Also, I am pretty sure that since then she has had phone messages regarding changing her SSRI. I do not think we have had f/u OV to f/u these changes.  Have her schedule a visit. Once this is scheduled,  if she needs me to refill Xanax once to hold her over to the visit ,     I can do so.

## 2014-04-28 ENCOUNTER — Encounter: Payer: Self-pay | Admitting: Family Medicine

## 2014-04-28 ENCOUNTER — Encounter: Payer: Self-pay | Admitting: Physician Assistant

## 2014-04-28 ENCOUNTER — Ambulatory Visit (INDEPENDENT_AMBULATORY_CARE_PROVIDER_SITE_OTHER): Payer: BC Managed Care – PPO | Admitting: Family Medicine

## 2014-04-28 VITALS — BP 132/78 | HR 82 | Temp 97.3°F | Resp 16 | Ht 61.0 in | Wt 171.0 lb

## 2014-04-28 DIAGNOSIS — F411 Generalized anxiety disorder: Secondary | ICD-10-CM

## 2014-04-28 DIAGNOSIS — M75100 Unspecified rotator cuff tear or rupture of unspecified shoulder, not specified as traumatic: Secondary | ICD-10-CM | POA: Insufficient documentation

## 2014-04-28 DIAGNOSIS — F3289 Other specified depressive episodes: Secondary | ICD-10-CM

## 2014-04-28 DIAGNOSIS — F419 Anxiety disorder, unspecified: Secondary | ICD-10-CM

## 2014-04-28 DIAGNOSIS — F329 Major depressive disorder, single episode, unspecified: Secondary | ICD-10-CM

## 2014-04-28 DIAGNOSIS — M25519 Pain in unspecified shoulder: Secondary | ICD-10-CM

## 2014-04-28 DIAGNOSIS — M719 Bursopathy, unspecified: Secondary | ICD-10-CM

## 2014-04-28 DIAGNOSIS — M67919 Unspecified disorder of synovium and tendon, unspecified shoulder: Secondary | ICD-10-CM

## 2014-04-28 DIAGNOSIS — F32A Depression, unspecified: Secondary | ICD-10-CM

## 2014-04-28 MED ORDER — ALPRAZOLAM 1 MG PO TABS: ORAL_TABLET | ORAL | Status: DC

## 2014-04-28 MED ORDER — HYDROCODONE-ACETAMINOPHEN 5-325 MG PO TABS: 1.0000 | ORAL_TABLET | Freq: Four times a day (QID) | ORAL | Status: DC | PRN

## 2014-04-28 MED ORDER — METHYLPREDNISOLONE (PAK) 4 MG PO TABS: ORAL_TABLET | ORAL | Status: DC

## 2014-04-28 NOTE — Assessment & Plan Note (Signed)
Per above it seems like more of her rotator cuff syndrome based on exam. 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

## 2014-04-28 NOTE — Progress Notes (Signed)
Patient ID: Sandra Parker, female   DOB: [birth year], 38 y.o.   MRN: [id number]   Subjective:    Patient ID: Sandra Parker, female    DOB: Jul 25, 1976, 38 y.o.   MRN: [id number]  Patient presents for R shoulder pain  issue here with right shoulder pain for the past 2 weeks. 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 She feels a burning sensation in her shoulder and he goes to midway. She is occasional tingling to in her hand. She denies any neck pain. She has taken some ibuprofen and Aleve with minimal improvement. She has pain when she lays on that side at nighttime.  She's doing well with her depression her current medications and will like to continue the current dose  Review Of Systems:  GEN- denies fatigue, fever, weight loss,weakness, recent illness HEENT- denies eye drainage, change in vision, nasal discharge, CVS- denies chest pain, palpitations RESP- denies SOB, cough, wheeze MSK- + joint pain, muscle aches, injury Neuro- denies headache, dizziness, syncope, seizure activity       Objective:    BP 132/78  Pulse 82  Temp(Src) 97.3 F (36.3 C) (Oral)  Resp 16  Ht 5\' 1"  (1.549 m)  Wt 171 lb (77.565 kg)  BMI 32.33 kg/m2 GEN- NAD, alert and oriented x3 Neck- Supple, FROM, neg spurlings MSK-Right shoulder normal inspection, +empty can, neg impignemnt signs, good ROM,pain with back scatch, pain with external rotation, biceps and triceps in tact Left arm/shoulder- FROM Neuro- , tone normal, neg phalens, neg tinels EXT- No edema Pulses- Radial 2+ PSYCH- normal affect and mood        Assessment & Plan:      Problem List Items Addressed This Visit   Rotator cuff syndrome - Primary      Note: This dictation was prepared with Dragon dictation along with smaller phrase technology. Any transcriptional errors that result from this process are unintentional.

## 2014-04-28 NOTE — Assessment & Plan Note (Signed)
Will give her Medrol Dosepak well as a short-term course of hydrocodone have her ice the shoulder and do some range of motion. We'll hold on imaging at this time

## 2014-04-28 NOTE — Assessment & Plan Note (Signed)
Doing well continue current medication

## 2014-04-28 NOTE — Patient Instructions (Signed)
Start the medrol dosepak Hydrocodone use for severe pain Use ICE for shoulder  F/U as needed

## 2014-04-28 NOTE — Assessment & Plan Note (Signed)
Doing well on current medications we'll continue at current dose

## 2014-05-04 ENCOUNTER — Telehealth: Payer: Self-pay | Admitting: Family Medicine

## 2014-05-04 MED ORDER — ESTRADIOL 1 MG PO TABS: 1.5000 mg | ORAL_TABLET | Freq: Every day | ORAL | Status: DC

## 2014-05-04 NOTE — Telephone Encounter (Signed)
Medication refilled per protocol. 

## 2014-06-08 ENCOUNTER — Telehealth: Payer: Self-pay | Admitting: Family Medicine

## 2014-06-08 DIAGNOSIS — M25519 Pain in unspecified shoulder: Secondary | ICD-10-CM

## 2014-06-08 NOTE — Telephone Encounter (Signed)
Requesting refill of Hydrocodone?

## 2014-06-09 MED ORDER — HYDROCODONE-ACETAMINOPHEN 5-325 MG PO TABS: 1.0000 | ORAL_TABLET | Freq: Four times a day (QID) | ORAL | Status: DC | PRN

## 2014-06-09 NOTE — Telephone Encounter (Signed)
Rx printed for provider signature. Pt made aware Rx ready for pick up.  Told if continues to have shoulder pain, needs to consider Ortho referral or follow up visit here before next refill.

## 2014-06-09 NOTE — Telephone Encounter (Signed)
Okay to refill, if shoulder still causing a lot of pain ,needs ortho evaluation or at least recheck in the office with me

## 2014-06-15 ENCOUNTER — Telehealth: Payer: Self-pay | Admitting: *Deleted

## 2014-06-15 NOTE — Telephone Encounter (Signed)
Message copied by Sheral Flow on Thu Jun 15, 2014  9:53 AM ------      Message from: Devoria Glassing      Created: Thu Jun 15, 2014  9:34 AM       [phone]      Patient is calling for another rx for her hydrocodone she claims to have left it in her car, and her daughter cleaned out her car and she threw it away. ------

## 2014-06-15 NOTE — Telephone Encounter (Signed)
Please advise.   Last office visit 04/28/2014.  Last refill 06/09/2014.

## 2014-06-15 NOTE — Telephone Encounter (Signed)
Call placed to patient to make aware. LMTRC. 

## 2014-06-15 NOTE — Telephone Encounter (Signed)
Will allow refill, this one time, also make sure she knows if she still has pain she needs to make OV or go see Ortho

## 2014-06-15 NOTE — Telephone Encounter (Signed)
Message copied by Sheral Flow on Thu Jun 15, 2014  9:53 AM ------      Message from: Devoria Glassing      Created: Thu Jun 15, 2014  9:34 AM       [phone]      Patient is calling for another rx for her hydrocodone she claims to have left it in her car, and her daughter cleaned out her car and she threw it away. ------

## 2014-06-15 NOTE — Telephone Encounter (Signed)
Patient returned call and made aware.   Patient states that she did not have medication filled, daughter just threw away prescription at the car wash with other papers in the car.   Call placed to pharmacy and was advised that lst prescription for Hydrocodone was filled 05/02/2014.  MD please advise.

## 2014-06-15 NOTE — Telephone Encounter (Signed)
Tell her sorry, but we cannot refill controlled substances early. DENIED.

## 2014-06-16 ENCOUNTER — Ambulatory Visit (INDEPENDENT_AMBULATORY_CARE_PROVIDER_SITE_OTHER): Payer: BC Managed Care – PPO | Admitting: Family Medicine

## 2014-06-16 ENCOUNTER — Encounter: Payer: Self-pay | Admitting: Family Medicine

## 2014-06-16 VITALS — BP 104/62 | HR 68 | Temp 97.8°F | Resp 14 | Ht 61.0 in | Wt 167.0 lb

## 2014-06-16 DIAGNOSIS — M719 Bursopathy, unspecified: Secondary | ICD-10-CM

## 2014-06-16 DIAGNOSIS — M67919 Unspecified disorder of synovium and tendon, unspecified shoulder: Secondary | ICD-10-CM

## 2014-06-16 DIAGNOSIS — M75101 Unspecified rotator cuff tear or rupture of right shoulder, not specified as traumatic: Secondary | ICD-10-CM

## 2014-06-16 DIAGNOSIS — G43709 Chronic migraine without aura, not intractable, without status migrainosus: Secondary | ICD-10-CM

## 2014-06-16 DIAGNOSIS — R42 Dizziness and giddiness: Secondary | ICD-10-CM

## 2014-06-16 MED ORDER — HYDROCODONE-ACETAMINOPHEN 5-325 MG PO TABS: 1.0000 | ORAL_TABLET | Freq: Four times a day (QID) | ORAL | Status: DC | PRN

## 2014-06-16 MED ORDER — TOPIRAMATE 25 MG PO TABS: 25.0000 mg | ORAL_TABLET | Freq: Every day | ORAL | Status: DC

## 2014-06-16 MED ORDER — MECLIZINE HCL 25 MG PO TABS: 25.0000 mg | ORAL_TABLET | Freq: Three times a day (TID) | ORAL | Status: DC | PRN

## 2014-06-16 NOTE — Patient Instructions (Signed)
Start topamax 1 tablet at bedtime, increase to 2 tablets in 2 weeks  Meclizine as needed for severe dizzy spell Pain medication refilled F/U 4 weeks

## 2014-06-16 NOTE — Telephone Encounter (Signed)
Pt aware RX ready.  Has appt here today will pick up then

## 2014-06-17 DIAGNOSIS — G43909 Migraine, unspecified, not intractable, without status migrainosus: Secondary | ICD-10-CM | POA: Insufficient documentation

## 2014-06-17 NOTE — Progress Notes (Signed)
Patient ID: Sandra Parker, female   DOB: 1976-10-04, 38 y.o.   MRN: [id number]   Subjective:    Patient ID: Sandra Parker, female    DOB: 1976-11-16, 38 y.o.   MRN: [id number]  Patient presents for Frequent Headaches  patient here with headache and dizzy spells. She's been having a least 2 severe temporal/frontal headaches a week associated with dizzy spells or she turns her head everything starts pain for a few minutes and then stops. Her headache usually lasts for couple hours she will take Tylenol or ibuprofen which helps some. Does not have any nausea mild photophobia. She denies any injury to her head. States they've been going on for cough but they've been getting worse. She also continues to have right shoulder pain. She was seen by orthopedics in the past 2 rotator cuff syndrome and she also has a bony abnormality in her shoulder however she declines any injections or surgery at this time. She's also unable to afford specialist who pain at this time    Review Of Systems:  GEN- denies fatigue, fever, weight loss,weakness, recent illness HEENT- denies eye drainage, change in vision, nasal discharge, CVS- denies chest pain, palpitations RESP- denies SOB, cough, wheeze ABD- denies N/V, change in stools, abd pain GU- denies dysuria, hematuria, dribbling, incontinence MSK- +joint pain, muscle aches, injury Neuro- + headache, +dizziness, syncope, seizure activity       Objective:    BP 104/62  Pulse 68  Temp(Src) 97.8 F (36.6 C) (Oral)  Resp 14  Ht 5\' 1"  (1.549 m)  Wt 167 lb (75.751 kg)  BMI 31.57 kg/m2 GEN- NAD, alert and oriented x3 HEENT- PERRL, EOMI, non injected sclera, pink conjunctiva, MMM, oropharynx clear, TM clear bilat, fundus benign Neck- Supple, no thyromegaly CVS- RRR, no murmur RESP-CTAB MSK- Decreased ROM RUE, TTP over anterior shoulder, +empty can  EXT- No edema Pulses- Radial 2+ Neuro- CNII-XII in tact, no deficits   Lying 108/72  Standing 108/78        Assessment & Plan:      Problem List Items Addressed This Visit   None      Note: This dictation was prepared with Dragon dictation along with smaller phrase technology. Any transcriptional errors that result from this process are unintentional.

## 2014-06-17 NOTE — Assessment & Plan Note (Signed)
Will start Topamax 25 mg at bedtime and have her increase to 50 mg, I think the dizziness may be associated with headaches however she's had vertigo in the past as well. I've also given her some meclizine of Topamax in her system K. she has severe spell

## 2014-06-17 NOTE — Assessment & Plan Note (Signed)
Medication refills. Discussed with her that she needs further workup as she is now chronic decreased range of motion in her shoulder which is unable to afford it at this time

## 2014-06-22 ENCOUNTER — Telehealth: Payer: Self-pay | Admitting: *Deleted

## 2014-06-22 NOTE — Telephone Encounter (Signed)
Received call from patient.   States that she is taking Topamax 25mg  every day and that it is improving her headaches during the day.   States that she takes medication about 7:30am, but she feels that it is beginning to wear off in the evenings. Reports that slight headaches are beginning to form around 7:30-7:45pm at night. She reports taking OTC Ibuprofen and this alleviates the headache somewhat, but not completely.   Requested MD to advise on if she should be taking medication later in day, or if she could increase dosage.

## 2014-06-23 MED ORDER — TOPIRAMATE 50 MG PO TABS
50.0000 mg | ORAL_TABLET | Freq: Every day | ORAL | Status: DC
Start: 2014-06-23 — End: 2014-08-28

## 2014-06-23 NOTE — Telephone Encounter (Signed)
Call placed to patient to make aware. LMTRC. 

## 2014-06-23 NOTE — Telephone Encounter (Signed)
Switch to bedtime and increase to 2 tablets

## 2014-06-23 NOTE — Telephone Encounter (Signed)
Patient returned call and made aware.   Prescription sent to pharmacy.  

## 2014-07-06 ENCOUNTER — Telehealth: Payer: Self-pay | Admitting: Physician Assistant

## 2014-07-06 NOTE — Telephone Encounter (Signed)
She has been seeing Dr. Buelah Manis for her last several office visits. Wait until tomorrow and asked Dr. Buelah Manis about this tomorrow. I dont want to get this pt on chronic pain meds.

## 2014-07-06 NOTE — Telephone Encounter (Signed)
[phone]   Pt is calling because she is needing a refill on HYDROcodone-acetaminophen (NORCO/VICODIN) 5-325 MG per tablet

## 2014-07-06 NOTE — Telephone Encounter (Signed)
Ok to refill??  Last office visit/ refill 06/16/2014.

## 2014-07-07 MED ORDER — HYDROCODONE-ACETAMINOPHEN 5-325 MG PO TABS: 1.0000 | ORAL_TABLET | Freq: Four times a day (QID) | ORAL | Status: DC | PRN

## 2014-07-07 NOTE — Telephone Encounter (Signed)
She can pick up on Monday

## 2014-07-07 NOTE — Telephone Encounter (Signed)
Prescription printed and patient made aware to come to office to pick up per Vm.

## 2014-07-14 ENCOUNTER — Ambulatory Visit: Payer: BC Managed Care – PPO | Admitting: Family Medicine

## 2014-07-28 ENCOUNTER — Telehealth: Payer: Self-pay | Admitting: *Deleted

## 2014-07-28 NOTE — Telephone Encounter (Signed)
Received fax from pharmacy requesting refill on Xanax.   Ok to refill??  Last office visit 06/16/2014.  Last refill 04/28/2014, #2 refill.

## 2014-07-31 MED ORDER — ALPRAZOLAM 1 MG PO TABS: ORAL_TABLET | ORAL | Status: DC

## 2014-07-31 NOTE — Telephone Encounter (Signed)
Medication called to pharmacy. 

## 2014-07-31 NOTE — Telephone Encounter (Signed)
I used to see this patient in the past but recently she has been seeing Dr. Buelah Manis. Her LOV regarding anxiety was with Dr. Buelah Manis.  Send message to Dr. Buelah Manis.

## 2014-07-31 NOTE — Telephone Encounter (Signed)
okay

## 2014-08-08 ENCOUNTER — Telehealth: Payer: Self-pay | Admitting: Physician Assistant

## 2014-08-08 MED ORDER — HYDROCODONE-ACETAMINOPHEN 5-325 MG PO TABS: 1.0000 | ORAL_TABLET | Freq: Four times a day (QID) | ORAL | Status: DC | PRN

## 2014-08-08 NOTE — Telephone Encounter (Signed)
?  ok to refill °

## 2014-08-08 NOTE — Telephone Encounter (Signed)
Script printed ready for provider signature and pt to pick up, pt is aware via vm needs ov.

## 2014-08-08 NOTE — Telephone Encounter (Signed)
[phone]   Pt is needing a refill on her HYDROcodone-acetaminophen (NORCO/VICODIN) 5-325 MG per tablet

## 2014-08-08 NOTE — Telephone Encounter (Signed)
Okay, due for OV

## 2014-08-28 ENCOUNTER — Encounter: Payer: Self-pay | Admitting: Family Medicine

## 2014-08-28 ENCOUNTER — Ambulatory Visit (INDEPENDENT_AMBULATORY_CARE_PROVIDER_SITE_OTHER): Payer: BC Managed Care – PPO | Admitting: Family Medicine

## 2014-08-28 VITALS — BP 120/72 | HR 76 | Temp 97.9°F | Resp 12 | Ht 61.0 in | Wt 167.0 lb

## 2014-08-28 DIAGNOSIS — Z79890 Hormone replacement therapy (postmenopausal): Secondary | ICD-10-CM | POA: Insufficient documentation

## 2014-08-28 DIAGNOSIS — M779 Enthesopathy, unspecified: Principal | ICD-10-CM

## 2014-08-28 DIAGNOSIS — E669 Obesity, unspecified: Secondary | ICD-10-CM

## 2014-08-28 DIAGNOSIS — E8940 Asymptomatic postprocedural ovarian failure: Secondary | ICD-10-CM

## 2014-08-28 DIAGNOSIS — R42 Dizziness and giddiness: Secondary | ICD-10-CM

## 2014-08-28 DIAGNOSIS — M65849 Other synovitis and tenosynovitis, unspecified hand: Secondary | ICD-10-CM

## 2014-08-28 DIAGNOSIS — M65839 Other synovitis and tenosynovitis, unspecified forearm: Secondary | ICD-10-CM

## 2014-08-28 DIAGNOSIS — M778 Other enthesopathies, not elsewhere classified: Secondary | ICD-10-CM | POA: Insufficient documentation

## 2014-08-28 DIAGNOSIS — M67919 Unspecified disorder of synovium and tendon, unspecified shoulder: Secondary | ICD-10-CM

## 2014-08-28 DIAGNOSIS — M75101 Unspecified rotator cuff tear or rupture of right shoulder, not specified as traumatic: Secondary | ICD-10-CM

## 2014-08-28 DIAGNOSIS — M719 Bursopathy, unspecified: Secondary | ICD-10-CM

## 2014-08-28 MED ORDER — HYDROCODONE-ACETAMINOPHEN 5-325 MG PO TABS: 1.0000 | ORAL_TABLET | Freq: Four times a day (QID) | ORAL | Status: DC | PRN

## 2014-08-28 MED ORDER — NAPROXEN 500 MG PO TABS: 500.0000 mg | ORAL_TABLET | Freq: Two times a day (BID) | ORAL | Status: DC

## 2014-08-28 MED ORDER — SCOPOLAMINE 1 MG/3DAYS TD PT72: 1.0000 | MEDICATED_PATCH | TRANSDERMAL | Status: DC

## 2014-08-28 MED ORDER — PHENTERMINE HCL 37.5 MG PO TABS: 37.5000 mg | ORAL_TABLET | Freq: Every day | ORAL | Status: DC

## 2014-08-28 NOTE — Assessment & Plan Note (Signed)
Will start anti-inflammatory Naprosyn twice a day she is able to tolerate ibuprofen also ice the forearm

## 2014-08-28 NOTE — Patient Instructions (Signed)
Try the phentermine take 1/2 tablet once a day for 2 weeks, then increase to 1 tablet once a day  Nausea patch sent Decrease the estrogen to 1 mg once a day  F/U 8 weeks for weight

## 2014-08-28 NOTE — Assessment & Plan Note (Signed)
Continued chronic pain. She's unable to afford physical therapy and does not want any injections done. Continue low-dose hydrocodone

## 2014-08-28 NOTE — Progress Notes (Signed)
Patient ID: Sandra Parker, female   DOB: September 08, 1976, 38 y.o.   MRN: [id number]   Subjective:    Patient ID: Sandra Parker, female    DOB: July 14, 1976, 38 y.o.   MRN: [id number]  Patient presents for F/U Dizziness and Knot on L arm   patient here to follow up chronic medical problems. Today she complains of a knot on her left forearm which is been there for a couple weeks. She denies any particular injury feels like it is swollen and tender to touch she also has pain with different motions of her arm  She continues to have chronic right shoulder pain as well. She's taking her medications as prescribed which do help.  Menopause she would like to decrease her estrogen to see she can get down to the lowest dose possible. She's status post hysterectomy.    dizzy spells her vertigo is under good control she uses meclizine as needed. She does note she gets nausea when she travels she has an upcoming trip but will like a scopolamine patch for this.  Obesity is something else to help with her weight loss. She was on phentermine in the past she lost about 15 pounds with this and will like to try again appear  Review Of Systems:  GEN- denies fatigue, fever, weight loss,weakness, recent illness HEENT- denies eye drainage, change in vision, nasal discharge, CVS- denies chest pain, palpitations RESP- denies SOB, cough, wheeze ABD- denies N/V, change in stools, abd pain GU- denies dysuria, hematuria, dribbling, incontinence MSK-+oint pain, muscle aches, injury Neuro- denies headache, dizziness, syncope, seizure activity       Objective:    BP 120/72  Pulse 76  Temp(Src) 97.9 F (36.6 C) (Oral)  Resp 12  Ht 5\' 1"  (1.549 m)  Wt 167 lb (75.751 kg)  BMI 31.57 kg/m2 GEN- NAD, alert and oriented x3 HEENT- PERRL, EOMI, non injected sclera, pink conjunctiva, MMM, oropharynx clear, TM clear bilat, fundus benign CVS- RRR, no murmur RESP-CTAB MSK- Left forearm no deformity noted, mild swelling over muscle  bulk of forearm, TTP, pain with supination against resistance   EXT- No edema Pulses- Radial 2+       Assessment & Plan:      Problem List Items Addressed This Visit   Vertigo     Well controlled, meclizine prn    Tendinitis of forearm - Primary     Will start anti-inflammatory Naprosyn twice a day she is able to tolerate ibuprofen also ice the forearm    Surgical menopause on hormone replacement therapy     Decrease to estrogen 1 mg daily we'll plan to start to taper her off over the next few months    Rotator cuff syndrome     Continued chronic pain. She's unable to afford physical therapy and does not want any injections done. Continue low-dose hydrocodone    Obesity, unspecified     Trial of phentermine, patient to will start after return for cruise  Note she was given a scopolamine patch to use while she is on her cruise for motion sickness    Relevant Medications      phentermine (ADIPEX-P) 37.5 MG tablet      Note: This dictation was prepared with Dragon dictation along with smaller phrase technology. Any transcriptional errors that result from this process are unintentional.

## 2014-08-28 NOTE — Assessment & Plan Note (Signed)
Well controlled, meclizine prn

## 2014-08-28 NOTE — Assessment & Plan Note (Signed)
Trial of phentermine, patient to will start after return for cruise  Note she was given a scopolamine patch to use while she is on her cruise for motion sickness

## 2014-08-28 NOTE — Assessment & Plan Note (Signed)
Decrease to estrogen 1 mg daily we'll plan to start to taper her off over the next few months

## 2014-08-30 ENCOUNTER — Other Ambulatory Visit: Payer: Self-pay | Admitting: *Deleted

## 2014-08-30 ENCOUNTER — Telehealth: Payer: Self-pay | Admitting: Physician Assistant

## 2014-08-30 MED ORDER — ACYCLOVIR 400 MG PO TABS: 800.0000 mg | ORAL_TABLET | Freq: Every day | ORAL | Status: DC

## 2014-08-30 NOTE — Telephone Encounter (Signed)
Prescription sent to pharmacy.

## 2014-08-30 NOTE — Telephone Encounter (Signed)
Okay to refill acyclovir

## 2014-08-30 NOTE — Telephone Encounter (Signed)
Patient is calling to ask some questions about her naproxin  [phone]

## 2014-08-30 NOTE — Telephone Encounter (Signed)
Call placed to patient.   Reports that she was wondering if she could take naproxen without food. Advised that meals help to relieve abdominal discomfort after taking medication, but she could try to see if she can tolerate.   Also reports that she has been seeing OBGYN, but she no longer requires specialist since her hysterectomy. Requested for MD to take over prescribing her Acyclovir 400mg  (1) tab PO BID. States that she gets this medication from [facility] in [place].   MD please advise.

## 2014-09-01 ENCOUNTER — Telehealth: Payer: Self-pay | Admitting: Physician Assistant

## 2014-09-01 MED ORDER — ONDANSETRON HCL 4 MG PO TABS: 4.0000 mg | ORAL_TABLET | Freq: Four times a day (QID) | ORAL | Status: DC | PRN

## 2014-09-01 NOTE — Telephone Encounter (Signed)
Patient returned call and made aware.

## 2014-09-01 NOTE — Telephone Encounter (Signed)
Patient says that the patch that we prescribed to go behind her ear? Was too expensive at the pharmacy please call her back at [phone]

## 2014-09-01 NOTE — Telephone Encounter (Signed)
Send in zofran 4mg  po q 6hrs , if she cant afford patch  #20 tabs

## 2014-09-01 NOTE — Telephone Encounter (Signed)
Prescription sent to pharmacy.   Call placed to patient. LMTRC.  

## 2014-09-01 NOTE — Telephone Encounter (Signed)
MD please advise

## 2014-09-12 ENCOUNTER — Ambulatory Visit (INDEPENDENT_AMBULATORY_CARE_PROVIDER_SITE_OTHER): Payer: BC Managed Care – PPO | Admitting: Family Medicine

## 2014-09-12 ENCOUNTER — Encounter: Payer: Self-pay | Admitting: Physician Assistant

## 2014-09-12 ENCOUNTER — Encounter: Payer: Self-pay | Admitting: Family Medicine

## 2014-09-12 VITALS — BP 124/70 | HR 68 | Temp 97.9°F | Resp 12 | Ht 61.0 in | Wt 170.0 lb

## 2014-09-12 DIAGNOSIS — M546 Pain in thoracic spine: Secondary | ICD-10-CM

## 2014-09-12 DIAGNOSIS — M549 Dorsalgia, unspecified: Secondary | ICD-10-CM | POA: Insufficient documentation

## 2014-09-12 DIAGNOSIS — E669 Obesity, unspecified: Secondary | ICD-10-CM

## 2014-09-12 DIAGNOSIS — N62 Hypertrophy of breast: Secondary | ICD-10-CM | POA: Insufficient documentation

## 2014-09-12 DIAGNOSIS — M545 Low back pain, unspecified: Secondary | ICD-10-CM

## 2014-09-12 DIAGNOSIS — R300 Dysuria: Secondary | ICD-10-CM

## 2014-09-12 DIAGNOSIS — N3000 Acute cystitis without hematuria: Secondary | ICD-10-CM

## 2014-09-12 LAB — URINALYSIS, ROUTINE W REFLEX MICROSCOPIC
Bilirubin Urine: NEGATIVE
Glucose, UA: NEGATIVE mg/dL
KETONES UR: NEGATIVE mg/dL
Nitrite: NEGATIVE
PROTEIN: NEGATIVE mg/dL
Specific Gravity, Urine: 1.02 (ref 1.005–1.030)
Urobilinogen, UA: 0.2 mg/dL (ref 0.0–1.0)
pH: 7 (ref 5.0–8.0)

## 2014-09-12 LAB — URINALYSIS, MICROSCOPIC ONLY
CASTS: NONE SEEN
Crystals: NONE SEEN

## 2014-09-12 MED ORDER — NITROFURANTOIN MONOHYD MACRO 100 MG PO CAPS: 100.0000 mg | ORAL_CAPSULE | Freq: Two times a day (BID) | ORAL | Status: DC

## 2014-09-12 NOTE — Patient Instructions (Signed)
Start marobid twice a day  Increase the water and cranberry  Take your pain medication Work Note for Tuesday, return on Wed  F/U as needed

## 2014-09-12 NOTE — Progress Notes (Signed)
Patient ID: Sandra Parker, female   DOB: January 16, 1976, 38 y.o.   MRN: [id number]   Subjective:    Patient ID: Sandra Parker, female    DOB: [birth year], 38 y.o.   MRN: [id number]  Patient presents for Low Back Pain  Patient here with left-sided back pain. She was on a cruise to a week ago and did not drink any water the entire time since then she's also had some tingling and burning when she urinates. She denies abdominal pain fever. She did take her pain medication last night which she has for her shoulder does help relieve some of her pain.  He also continues to complain of neck pain and mid back pain from the size of her breasts. She is trying to get a breast reduction she also has the shoulder straps digging into her shoulders daily which causes pain area   Review Of Systems:  GEN- denies fatigue, fever, weight loss,weakness, recent illness HEENT- denies eye drainage, change in vision, nasal discharge, CVS- denies chest pain, palpitations RESP- denies SOB, cough, wheeze ABD- denies N/V, change in stools, abd pain GU- denies dysuria, hematuria, dribbling, incontinence MSK- +joint pain, muscle aches, injury Neuro- denies headache, dizziness, syncope, seizure activity       Objective:    BP 124/70  Pulse 68  Temp(Src) 97.9 F (36.6 C) (Oral)  Resp 12  Ht 5\' 1"  (1.549 m)  Wt 170 lb (77.111 kg)  BMI 32.14 kg/m2 GEN- NAD, alert and oriented x3 HEENT- PERRL, EOMI, non injected sclera, pink conjunctiva, MMM, oropharynx clear Neck- Supple,  CVS- RRR, no murmur Chest wall- breast hypertrophy size38DD,  RESP-CTAB ABD-NABS,soft,NT,ND, no CVA tenderness MSK- Spine- NT, TTP left paraspinals, neg SLR, no spasm noted, throacic spine ttp along bra strap, indentations into both shoulders where straps lay EXT- No edema Pulses- Radial, DP- 2+        Assessment & Plan:      Problem List Items Addressed This Visit   Obesity, unspecified     Patient is on a trial of phentermine to also  help with her weight loss, she will start this within the next week    Large breasts     Her large breast size is contributing to her neck as well as shoulder pain groups can be seen on examination for her straps leg she would benefit from mammoplasty    Back pain     Today she has acute low back pain which I think is a combination of Lopid dehydration from not drinking enough water as well as muscle skeletal pain. Regarding her thoracic back pain this is ongoing and chronic secondary to the size of her breasts she would benefit from mammoplasty she has NSAIDs at home which are prescribed as well as pain medication to help with the pain.    Relevant Orders      Urinalysis, Routine w reflex microscopic (Completed)   Acute cystitis    Other Visit Diagnoses   Burning with urination    -  Primary    Relevant Orders       Urinalysis, Routine w reflex microscopic (Completed)    Left-sided low back pain without sciatica        Relevant Orders       Urinalysis, Routine w reflex microscopic (Completed)       Note: This dictation was prepared with Dragon dictation along with smaller phrase technology. Any transcriptional errors that result from this process are unintentional.

## 2014-09-12 NOTE — Assessment & Plan Note (Signed)
Her large breast size is contributing to her neck as well as shoulder pain groups can be seen on examination for her straps leg she would benefit from mammoplasty

## 2014-09-12 NOTE — Assessment & Plan Note (Signed)
Today she has acute low back pain which I think is a combination of Lopid dehydration from not drinking enough water as well as muscle skeletal pain. Regarding her thoracic back pain this is ongoing and chronic secondary to the size of her breasts she would benefit from mammoplasty she has NSAIDs at home which are prescribed as well as pain medication to help with the pain.

## 2014-09-12 NOTE — Assessment & Plan Note (Signed)
Patient is on a trial of phentermine to also help with her weight loss, she will start this within the next week

## 2014-09-21 ENCOUNTER — Other Ambulatory Visit: Payer: Self-pay | Admitting: *Deleted

## 2014-09-21 ENCOUNTER — Telehealth: Payer: Self-pay | Admitting: *Deleted

## 2014-09-21 DIAGNOSIS — M545 Low back pain, unspecified: Secondary | ICD-10-CM

## 2014-09-21 DIAGNOSIS — N62 Hypertrophy of breast: Secondary | ICD-10-CM

## 2014-09-21 NOTE — Telephone Encounter (Signed)
Received call from patient.   States that she has increased pain to her back and requested to have referral placed to [facility].   Referral placed.   MD to be made aware.

## 2014-09-22 NOTE — Telephone Encounter (Signed)
noted 

## 2014-09-26 ENCOUNTER — Telehealth: Payer: Self-pay | Admitting: Physician Assistant

## 2014-09-26 NOTE — Telephone Encounter (Signed)
[phone]   Pt is needing a refill on HYDROcodone-acetaminophen (NORCO/VICODIN) 5-325 MG per tablet

## 2014-09-26 NOTE — Telephone Encounter (Signed)
Received call from patient.   Reports that she was seen in Urgent Care at [facility] after hours and given new prescriptions for Norco 5/325mg  and Robaxin for pain. States that medication is helping during the day, but her pain is worse at night.   MD made aware and states that she needs to F/U with Orthopedics.   Call placed to patient and patient made aware.

## 2014-10-16 ENCOUNTER — Encounter: Payer: Self-pay | Admitting: Family Medicine

## 2014-10-16 ENCOUNTER — Ambulatory Visit (INDEPENDENT_AMBULATORY_CARE_PROVIDER_SITE_OTHER): Payer: BC Managed Care – PPO | Admitting: Family Medicine

## 2014-10-16 VITALS — BP 112/64 | HR 78 | Temp 98.5°F | Resp 16 | Ht 61.0 in | Wt 168.0 lb

## 2014-10-16 DIAGNOSIS — H109 Unspecified conjunctivitis: Secondary | ICD-10-CM

## 2014-10-16 DIAGNOSIS — A499 Bacterial infection, unspecified: Secondary | ICD-10-CM

## 2014-10-16 DIAGNOSIS — H1089 Other conjunctivitis: Secondary | ICD-10-CM

## 2014-10-16 MED ORDER — POLYMYXIN B-TRIMETHOPRIM 10000-0.1 UNIT/ML-% OP SOLN: 1.0000 [drp] | Freq: Four times a day (QID) | OPHTHALMIC | Status: DC

## 2014-10-16 NOTE — Progress Notes (Signed)
Patient ID: Sandra Parker, female   DOB: 03/02/1976, 38 y.o.   MRN: [id number]   Subjective:    Patient ID: Sandra Parker, female    DOB: 12-Aug-1976, 38 y.o.   MRN: [id number]  Patient presents for Possible Pink Eye  patient here with concern for pink eye in left eye. She's had redness irritation drainage of yellow-green pus and adding for the past 3 days. She did have a mild upper respiratory virus about a week before. She's not had any fever. She took her contacts out and is now wearing her glasses. Her vision is a little bit blurred due to the drainage    Review Of Systems:  GEN- denies fatigue, fever, weight loss,weakness, recent illness HEENT- + eye drainage,+ change in vision, nasal discharge, CVS- denies chest pain, palpitations RESP- denies SOB, cough, wheeze Neuro- denies headache, dizziness, syncope, seizure activity       Objective:    BP 112/64  Pulse 78  Temp(Src) 98.5 F (36.9 C) (Oral)  Resp 16  Ht 5\' 1"  (1.549 m)  Wt 168 lb (76.204 kg)  BMI 31.76 kg/m2 GEN- NAD, alert and oriented x3 HEENT- PERRL, EOMI,+ left injected sclera, injected bilat conjunctiva, yellow drainage from corner of left eye MMM, oropharynx clear, vision grossly in tact Neck- Supple, no LAD CVS- RRR, no murmur RESP-CTAB        Assessment & Plan:      Problem List Items Addressed This Visit   None    Visit Diagnoses   Bacterial conjunctivitis    -  Primary    polytrim drops to both eyes, wear glasses, new contacts, warm compresses       Note: This dictation was prepared with Dragon dictation along with smaller phrase technology. Any transcriptional errors that result from this process are unintentional.

## 2014-10-16 NOTE — Patient Instructions (Signed)
Apply to eye as directed F/U AS NEEDED

## 2014-10-18 ENCOUNTER — Encounter: Payer: Self-pay | Admitting: Physician Assistant

## 2014-10-19 ENCOUNTER — Encounter: Payer: Self-pay | Admitting: Physician Assistant

## 2014-10-19 ENCOUNTER — Ambulatory Visit (INDEPENDENT_AMBULATORY_CARE_PROVIDER_SITE_OTHER): Payer: BC Managed Care – PPO | Admitting: Physician Assistant

## 2014-10-19 VITALS — BP 100/80 | HR 80 | Temp 99.5°F | Resp 18 | Ht 61.0 in | Wt 168.0 lb

## 2014-10-19 DIAGNOSIS — J029 Acute pharyngitis, unspecified: Secondary | ICD-10-CM

## 2014-10-19 DIAGNOSIS — H109 Unspecified conjunctivitis: Secondary | ICD-10-CM

## 2014-10-19 MED ORDER — AZITHROMYCIN 250 MG PO TABS: ORAL_TABLET | ORAL | Status: DC

## 2014-10-19 MED ORDER — SULFACETAMIDE SODIUM 10 % OP SOLN
2.0000 [drp] | OPHTHALMIC | Status: DC
Start: 2014-10-19 — End: 2015-01-24

## 2014-10-19 NOTE — Progress Notes (Signed)
Patient ID: Sandra Parker MRN: [id number], DOB: 05-18-1976, 38 y.o. Date of Encounter: 10/19/2014, 5:00 PM    Chief Complaint:  Chief Complaint  Patient presents with  . F/U Pink Eye    pain and irritation have not gotten better     HPI: 38 y.o. year old female here for followup. She had a visit with Dr. Buelah Manis here 10/16/14. At that time her left eye had been irritated and a little red and with some drainage for 3 days. Dr. Buelah Manis prescribed Polytrim drops. Patient states that she has been applying the drops regularly as directed. However her eyes have gotten worse. Now both of her eyes are very red. She says that they are very matted together in the mornings. She also says that her throat is raw now and is feeling much worse than it was. Says that she has no mucus from her nose and has no cough and has had no fever.     Home Meds:   Outpatient Prescriptions Prior to Visit  Medication Sig Dispense Refill  . acyclovir (ZOVIRAX) 400 MG tablet Take 2 tablets (800 mg total) by mouth daily.  60 tablet  3  . ALPRAZolam (XANAX) 1 MG tablet TAKE (1) TABLET BY MOUTH (2) TIMES DAILY AS NEEDED.  60 tablet  2  . cetirizine (ZYRTEC) 10 MG tablet Take 1 tablet (10 mg total) by mouth daily.  30 tablet  11  . estradiol (ESTRACE) 1 MG tablet Take 1 mg by mouth daily.      Marland Kitchen FLUoxetine (PROZAC) 20 MG tablet Take 1 tablet (20 mg total) by mouth daily.  30 tablet  3  . HYDROcodone-acetaminophen (NORCO/VICODIN) 5-325 MG per tablet Take 1 tablet by mouth every 6 (six) hours as needed for moderate pain.  30 tablet  0  . meclizine (ANTIVERT) 25 MG tablet Take 1 tablet (25 mg total) by mouth 3 (three) times daily as needed for dizziness.  30 tablet  0  . Multiple Vitamin (MULTIVITAMIN) tablet Take 1 tablet by mouth daily. One-a-day      . ondansetron (ZOFRAN) 4 MG tablet Take 1 tablet (4 mg total) by mouth every 6 (six) hours as needed for nausea or vomiting.  20 tablet  0  . phentermine (ADIPEX-P) 37.5  MG tablet Take 1 tablet (37.5 mg total) by mouth daily before breakfast.  30 tablet  2  . trimethoprim-polymyxin b (POLYTRIM) ophthalmic solution Place 1 drop into both eyes 4 (four) times daily.  10 mL  1   No facility-administered medications prior to visit.    Allergies:  Allergies  Allergen Reactions  . Ciprofloxacin   . Prozac [Fluoxetine] Other (See Comments)    Bad headaches  . Relafen [Nabumetone]   . Wellbutrin [Bupropion]     Bad dreams,  Feel crazy  . Cephalexin Rash      Review of Systems: See HPI for pertinent ROS. All other ROS negative.    Physical Exam: Blood pressure 100/80, pulse 80, temperature 99.5 F (37.5 C), temperature source Oral, resp. rate 18, height 5\' 1"  (1.549 m), weight 168 lb (76.204 kg)., Body mass index is 31.76 kg/(m^2). General:  WF. Appears in no acute distress. HEENT: Normocephalic, atraumatic. Bialteral eyes with diffuse severe conjunctival injection.   Bilateral auditory canals clear, TM's are without perforation, pearly grey and translucent with reflective cone of light bilaterally. Tonsils are enlarged, moderate-severe erythema, and with Exudate. No peritonsillar abscess. Neck: Supple. No thyromegaly. No lymphadenopathy. Lungs: Clear bilaterally  to auscultation without wheezes, rales, or rhonchi. Breathing is unlabored. Heart: Regular rhythm. No murmurs, rubs, or gallops. Msk:  Strength and tone normal for age. Extremities/Skin: Warm and dry.  No rashes. Neuro: Alert and oriented X 3. Moves all extremities spontaneously. Gait is normal. CNII-XII grossly in tact. Psych:  Responds to questions appropriately with a normal affect.     ASSESSMENT AND PLAN:  38 y.o. year old female with  1. Bilateral conjunctivitis Stop the Poly Trim. I think she is allergic to this.  - sulfacetamide (BLEPH-10) 10 % ophthalmic solution; Place 2 drops into both eyes every 3 (three) hours.  Dispense: 15 mL; Refill: 0  2. Acute pharyngitis, unspecified  pharyngitis type Cover for Strep. Clinically, c/w Strep. She has allergy to cephalexin so will avoid amoxicillin. - azithromycin (ZITHROMAX) 250 MG tablet; Take 2 daily for 5 days  Dispense: 10 tablet; Refill: 0  If her eyes worsen or are not improved in a couple of days I have told her to followup with an eye specialist. If her sore throat does not resolve after completion of antibiotics, followup with [place].  Marin Olp [place], [place], BSFM 10/19/2014 5:00 PM

## 2014-10-27 ENCOUNTER — Other Ambulatory Visit: Payer: Self-pay | Admitting: Family Medicine

## 2014-10-30 ENCOUNTER — Encounter: Payer: Self-pay | Admitting: Physician Assistant

## 2014-10-30 NOTE — Telephone Encounter (Signed)
Ok to refill??  Last office visit 10/19/2014.  Last refill 08/19/2014, #2 refills.

## 2014-10-30 NOTE — Telephone Encounter (Signed)
Send this to Dr. Buelah Manis. She has been seeing pt regarding anxiety/depression issues and she has been prescbribing this.

## 2014-10-30 NOTE — Telephone Encounter (Signed)
MD please advise

## 2014-10-30 NOTE — Telephone Encounter (Signed)
Medication called to pharmacy. 

## 2014-10-30 NOTE — Telephone Encounter (Signed)
Okay to refill? 

## 2014-11-02 ENCOUNTER — Telehealth: Payer: Self-pay | Admitting: Physician Assistant

## 2014-11-02 NOTE — Telephone Encounter (Signed)
Patient is calling to see if we can fax the form that she left [place] for her to have surgery, said she is calling to leave the fax number  [phone]  Attn elizabeth ([facility])

## 2014-11-02 NOTE — Telephone Encounter (Signed)
MD to be made aware.  

## 2014-11-03 ENCOUNTER — Telehealth: Payer: Self-pay | Admitting: *Deleted

## 2014-11-03 NOTE — Telephone Encounter (Signed)
Received call from patient.   Reports that she has been seen for pain and MD prescribed Norco Q6hrs PRN, #30. Last refill from [facility] 07/07/2014.  States that she went to ortho urgent care and the MD there prescribed the same pain medication.   Requested MD to advise if she would need to continue to go to urgent care or ortho, or if MD could resume prescription.   MD please advise.

## 2014-11-03 NOTE — Telephone Encounter (Signed)
Re-faxed chart notes to requested fax number.

## 2014-11-03 NOTE — Telephone Encounter (Signed)
I dont not have any form, we faxed over the office note from a few months ago about her breast size, you can refax this, otherwise there was no form for me to complete

## 2014-11-03 NOTE — Telephone Encounter (Signed)
Call placed to patient and patient made aware.  

## 2014-11-03 NOTE — Telephone Encounter (Signed)
She needs to see the orthopedics

## 2014-11-06 ENCOUNTER — Other Ambulatory Visit: Payer: Self-pay | Admitting: Physician Assistant

## 2014-11-06 ENCOUNTER — Telehealth: Payer: Self-pay | Admitting: Physician Assistant

## 2014-11-06 NOTE — Telephone Encounter (Signed)
Medication refilled per protocol. 

## 2014-11-06 NOTE — Telephone Encounter (Signed)
Patient calling to get refill on her estrace her pharmacy is closed right now and [place] be in court [place] day  [facility] [place]  If questions call [phone]

## 2014-11-16 ENCOUNTER — Telehealth: Payer: Self-pay | Admitting: Physician Assistant

## 2014-11-16 MED ORDER — PHENTERMINE HCL 15 MG PO CAPS: 15.0000 mg | ORAL_CAPSULE | ORAL | Status: DC

## 2014-11-16 NOTE — Telephone Encounter (Signed)
This does come in a 15 mg once daily dose. Would decrease the dose. Can print a new prescription for phentermine 15 mg 1 by mouth every morning #30 with 2 refills.

## 2014-11-16 NOTE — Telephone Encounter (Signed)
Prescription printed and patient made aware to come to office to pick up.  

## 2014-11-16 NOTE — Telephone Encounter (Signed)
Call placed to patient.   Reports that she is taking Phentermine 37.5mg  for weight loss.   States that she has excessive amounts of energy throughout the day and is having difficulty sleeping at night.   MD please advise.

## 2014-11-16 NOTE — Telephone Encounter (Signed)
[phone]  Pt is wanting to know if she could be put on a lower dose of findermin??

## 2014-12-26 ENCOUNTER — Encounter: Payer: Self-pay | Admitting: Physician Assistant

## 2014-12-26 ENCOUNTER — Encounter: Payer: Self-pay | Admitting: Family Medicine

## 2014-12-26 ENCOUNTER — Ambulatory Visit: Payer: BC Managed Care – PPO | Admitting: Family Medicine

## 2014-12-26 ENCOUNTER — Ambulatory Visit (INDEPENDENT_AMBULATORY_CARE_PROVIDER_SITE_OTHER): Payer: BC Managed Care – PPO | Admitting: Family Medicine

## 2014-12-26 VITALS — BP 118/64 | HR 78 | Temp 97.6°F | Resp 14 | Ht 62.0 in | Wt 169.0 lb

## 2014-12-26 DIAGNOSIS — R300 Dysuria: Secondary | ICD-10-CM

## 2014-12-26 DIAGNOSIS — N3000 Acute cystitis without hematuria: Secondary | ICD-10-CM

## 2014-12-26 LAB — URINALYSIS, MICROSCOPIC ONLY
CASTS: NONE SEEN
Crystals: NONE SEEN
WBC, UA: NONE SEEN WBC/hpf (ref <3)

## 2014-12-26 LAB — URINALYSIS, ROUTINE W REFLEX MICROSCOPIC
Bilirubin Urine: NEGATIVE
GLUCOSE, UA: NEGATIVE mg/dL
Ketones, ur: NEGATIVE mg/dL
LEUKOCYTES UA: NEGATIVE
Nitrite: NEGATIVE
PH: 6 (ref 5.0–8.0)
Protein, ur: NEGATIVE mg/dL
Specific Gravity, Urine: 1.015 (ref 1.005–1.030)
Urobilinogen, UA: 0.2 mg/dL (ref 0.0–1.0)

## 2014-12-26 MED ORDER — NITROFURANTOIN MONOHYD MACRO 100 MG PO CAPS: 100.0000 mg | ORAL_CAPSULE | Freq: Two times a day (BID) | ORAL | Status: DC

## 2014-12-26 NOTE — Assessment & Plan Note (Signed)
Will treat for uncomplicated UTI, UA in general unremarkable but with flank pain, macrobid x 3 days, AZO, water and see how she doe

## 2014-12-26 NOTE — Patient Instructions (Signed)
Plenty of water to flush AZO Macrobid twice a day  F/U as needed

## 2014-12-26 NOTE — Progress Notes (Signed)
Patient ID: Sandra Parker, female   DOB: [birth year], 38 y.o.   MRN: [id number]   Subjective:    Patient ID: Sandra Parker, female    DOB: 1976/10/26, 38 y.o.   MRN: [id number]  Patient presents for UTI  Patient here with dysuria for the past 2 days. She's also had some urgency however she only dribbles out a little. She had been drinking a significant amount of water but has been having more junk food and soda recently. She's also had some left lower back pain since she had the symptoms no injury to her back recently. She is not taking any over-the-counter medications. No fever no change in bowels. She had some mild vaginal discharge.   Review Of Systems:  GEN- denies fatigue, fever, weight loss,weakness, recent illness HEENT- denies eye drainage, change in vision, nasal discharge, CVS- denies chest pain, palpitations RESP- denies SOB, cough, wheeze ABD- denies N/V, change in stools, abd pain GU- + dysuria, hematuria, dribbling, incontinence MSK- denies joint pain, +muscle aches, injury Neuro- denies headache, dizziness, syncope, seizure activity       Objective:    BP 118/64 mmHg  Pulse 78  Temp(Src) 97.6 F (36.4 C) (Oral)  Resp 14  Ht 5\' 2"  (1.575 m)  Wt 169 lb (76.658 kg)  BMI 30.90 kg/m2 GEN- NAD, alert and oriented x3 CVS- RRR, no murmur RESP-CTAB ABD-NABS,soft,NT,ND, mild discomfort left flank, no suprapubic tenderness         Assessment & Plan:      Problem List Items Addressed This Visit      Unprioritized   Acute cystitis    Will treat for uncomplicated UTI, UA in general unremarkable but with flank pain, macrobid x 3 days, AZO, water and see how she doe     Other Visit Diagnoses    Burning with urination    -  Primary    Relevant Orders       Urinalysis, Routine w reflex microscopic (Completed)       Note: This dictation was prepared with Dragon dictation along with smaller phrase technology. Any transcriptional errors that result from this process are  unintentional.

## 2015-01-24 ENCOUNTER — Ambulatory Visit (HOSPITAL_COMMUNITY)
Admission: RE | Admit: 2015-01-24 | Discharge: 2015-01-24 | Disposition: A | Discharge status: Home / Self Care | Payer: BC Managed Care – PPO | Source: Ambulatory Visit | Attending: Family Medicine | Admitting: Family Medicine

## 2015-01-24 ENCOUNTER — Encounter: Payer: Self-pay | Admitting: Family Medicine

## 2015-01-24 ENCOUNTER — Ambulatory Visit (INDEPENDENT_AMBULATORY_CARE_PROVIDER_SITE_OTHER): Payer: BC Managed Care – PPO | Admitting: Family Medicine

## 2015-01-24 VITALS — BP 110/64 | HR 72 | Temp 97.6°F | Resp 14 | Ht 62.0 in | Wt 172.0 lb

## 2015-01-24 DIAGNOSIS — M79642 Pain in left hand: Secondary | ICD-10-CM | POA: Insufficient documentation

## 2015-01-24 DIAGNOSIS — M79645 Pain in left finger(s): Secondary | ICD-10-CM | POA: Insufficient documentation

## 2015-01-24 DIAGNOSIS — S6992XA Unspecified injury of left wrist, hand and finger(s), initial encounter: Secondary | ICD-10-CM

## 2015-01-24 DIAGNOSIS — W009XXA Unspecified fall due to ice and snow, initial encounter: Secondary | ICD-10-CM | POA: Diagnosis not present

## 2015-01-24 DIAGNOSIS — M79602 Pain in left arm: Secondary | ICD-10-CM | POA: Diagnosis not present

## 2015-01-24 NOTE — Patient Instructions (Signed)
Get the xray of the hand Okay to take ibuprofen as needed F/U as previous

## 2015-01-24 NOTE — Progress Notes (Signed)
Patient ID: Sandra Parker, female   DOB: 1976-12-06, 39 y.o.   MRN: [id number]   Subjective:    Patient ID: Sandra Parker, female    DOB: 14-Aug-1976, 39 y.o.   MRN: [id number]  Patient presents for Wrist Pain  patient here with left wrist and hand pain. On Monday when she was out in the snow in [employer] parking lot at her work she slipped and fell she tried to catch herself by outstretching her left hand she's had pain at the base of the thumb and the wrist since then. She had some mild swelling but no bruising. She's taken ibuprofen and one of her hydrocodone for pain relief. It was recommended by her job that she come in to have this evaluated    Review Of Systems:  GEN- denies fatigue, fever, weight loss,weakness, recent illness HEENT- denies eye drainage, change in vision, nasal discharge, CVS- denies chest pain, palpitations RESP- denies SOB, cough, wheeze ABD- denies N/V, change in stools, abd pain GU- denies dysuria, hematuria, dribbling, incontinence MSK- +joint pain, muscle aches, injury Neuro- denies headache, dizziness, syncope, seizure activity       Objective:    BP 110/64 mmHg  Pulse 72  Temp(Src) 97.6 F (36.4 C) (Oral)  Resp 14  Ht 5\' 2"  (1.575 m)  Wt 172 lb (78.019 kg)  BMI 31.45 kg/m2 GEN-NAD,alert and oriented x 3 MSK- inspection bilat hands normal FROM wrist/hand right , left hand pain with flexion at wrist, TTP over anatomic snuff box at base and radial styloid, no erythema, mild swelling, able to make fist        Assessment & Plan:      Problem List Items Addressed This Visit    None    Visit Diagnoses    Left wrist injury, initial encounter    -  Primary    Obtain xray due to point tenderness, hopefully just sprain of wrist, continue NSAIDS, ICE, pain meds    Relevant Orders    DG Hand Complete Left       Note: This dictation was prepared with Dragon dictation along with smaller phrase technology. Any transcriptional errors that result from this process  are unintentional.

## 2015-01-31 ENCOUNTER — Other Ambulatory Visit: Payer: Self-pay | Admitting: Family Medicine

## 2015-01-31 NOTE — Telephone Encounter (Signed)
Ok to refill??  Last office visit 01/24/2015.   Last refill 11/17/2014, #2 refills.

## 2015-01-31 NOTE — Telephone Encounter (Signed)
Okay to refill? 

## 2015-02-01 NOTE — Telephone Encounter (Signed)
Medication called to pharmacy. 

## 2015-02-06 ENCOUNTER — Telehealth: Payer: Self-pay | Admitting: Physician Assistant

## 2015-02-06 NOTE — Telephone Encounter (Signed)
[phone] PT is needing a refill on   phentermine 15 MG capsule

## 2015-02-07 MED ORDER — PHENTERMINE HCL 37.5 MG PO CAPS: 37.5000 mg | ORAL_CAPSULE | ORAL | Status: DC

## 2015-02-07 NOTE — Telephone Encounter (Signed)
If she is using for weight loss, I do not see any change, and would advise increasing to 37.5mg  once a day, come in to office in 2 monhts if no appt already scheduled for weight

## 2015-02-07 NOTE — Telephone Encounter (Signed)
Patient states that she is using medication for weight loss.   Advised of MD recommendation to increase prescription. Patient agrees.   Medication called to pharmacy.

## 2015-02-07 NOTE — Telephone Encounter (Signed)
Ok to refill??  Last refill 11/16/2014, #2 refills.

## 2015-03-23 ENCOUNTER — Ambulatory Visit: Payer: BC Managed Care – PPO | Admitting: Family Medicine

## 2015-03-26 ENCOUNTER — Ambulatory Visit: Payer: BC Managed Care – PPO | Admitting: Family Medicine

## 2015-04-17 ENCOUNTER — Ambulatory Visit: Payer: BC Managed Care – PPO | Admitting: Family Medicine

## 2015-04-17 ENCOUNTER — Other Ambulatory Visit: Payer: Self-pay | Admitting: Physician Assistant

## 2015-04-17 ENCOUNTER — Encounter: Payer: Self-pay | Admitting: Physician Assistant

## 2015-04-17 NOTE — Telephone Encounter (Signed)
Medication refilled per protocol. 

## 2015-04-24 ENCOUNTER — Ambulatory Visit (INDEPENDENT_AMBULATORY_CARE_PROVIDER_SITE_OTHER): Payer: BC Managed Care – PPO | Admitting: Family Medicine

## 2015-04-24 ENCOUNTER — Encounter: Payer: Self-pay | Admitting: Family Medicine

## 2015-04-24 VITALS — BP 110/60 | HR 68 | Temp 98.0°F | Resp 14 | Ht 62.0 in | Wt 169.0 lb

## 2015-04-24 DIAGNOSIS — F419 Anxiety disorder, unspecified: Secondary | ICD-10-CM

## 2015-04-24 DIAGNOSIS — G4700 Insomnia, unspecified: Secondary | ICD-10-CM

## 2015-04-24 DIAGNOSIS — F329 Major depressive disorder, single episode, unspecified: Secondary | ICD-10-CM | POA: Diagnosis not present

## 2015-04-24 DIAGNOSIS — F32A Depression, unspecified: Secondary | ICD-10-CM

## 2015-04-24 MED ORDER — ALPRAZOLAM 1 MG PO TABS: ORAL_TABLET | ORAL | Status: DC

## 2015-04-24 MED ORDER — VORTIOXETINE HBR 5 MG PO TABS: ORAL_TABLET | ORAL | Status: DC

## 2015-04-24 NOTE — Progress Notes (Signed)
Patient ID: Sandra Parker, female   DOB: [birth year], 39 y.o.   MRN: [id number]   Subjective:    Patient ID: Sandra Parker, female    DOB: 1976-04-12, 39 y.o.   MRN: [id number]  Patient presents for Medication Review/ Refill and Inability to Focus  patient here to discuss medications. She's had difficulty focusing over the past month. After further discussion her son who is 36 years old has decided to into the [place] she found out a month ago when her symptoms started she's had difficulty focusing on things at work and at home and she is always worried about him. She states that she will just burst out in tears thinking about all the bad things that can happen while he is in the [employer]. He is actually set to leave to go to training camp this Sunday and her nerves have been really bad. She's been taking Xanax twice a day which helps some but is still not keeping things under control. She has not been sleeping well and her appetite has also been down. She states she is trying to stay strong for him as this is always been history and to go to the [employer]. She actually stopped taking the Prozac because it was giving her headaches in the past she was also on Paxil but she gained a lot of weight with this medication    Review Of Systems:  GEN- denies fatigue, fever, weight loss,weakness, recent illness HEENT- denies eye drainage, change in vision, nasal discharge, CVS- denies chest pain, palpitations RESP- denies SOB, cough, wheeze ABD- denies N/V, change in stools, abd pain GU- denies dysuria, hematuria, dribbling, incontinence MSK- denies joint pain, muscle aches, injury Neuro- denies headache, dizziness, syncope, seizure activity       Objective:    BP 110/60 mmHg  Pulse 68  Temp(Src) 98 F (36.7 C) (Oral)  Resp 14  Ht 5\' 2"  (1.575 m)  Wt 169 lb (76.658 kg)  BMI 30.90 kg/m2 GEN- NAD, alert and oriented x3 Psych- depressed and tearful during visit, not anxious, well groomed, no SI,  normal speech       Assessment & Plan:      Problem List Items Addressed This Visit    Insomnia   Depression - Primary   Relevant Medications   Vortioxetine HBr (BRINTELLIX) 5 MG TABS   Anxiety   Relevant Medications   Vortioxetine HBr (BRINTELLIX) 5 MG TABS      Note: This dictation was prepared with Dragon dictation along with smaller phrase technology. Any transcriptional errors that result from this process are unintentional.

## 2015-04-24 NOTE — Assessment & Plan Note (Addendum)
I think this is more situational depression as this is a life change with her son going to the [employer] and this is affecting her focus and mood. I'm going to try her on Brintillex him start her at 5 mg once a day in addition to her Xanax 1 mg twice a day

## 2015-04-24 NOTE — Patient Instructions (Signed)
Start brintillex Continue xanax as prescribed F/U 4 weeks

## 2015-04-25 ENCOUNTER — Other Ambulatory Visit: Payer: Self-pay | Admitting: Physician Assistant

## 2015-04-25 ENCOUNTER — Telehealth: Payer: Self-pay | Admitting: Physician Assistant

## 2015-04-25 NOTE — Telephone Encounter (Signed)
Returned call to patient.   Reports that she hs been experiencing abdominal cramping, nausea and diarrhea since beginning Brintillex.   MD please advise.

## 2015-04-25 NOTE — Telephone Encounter (Signed)
Call pt it may cause nausea and upset stomach but this should subside after a few days, Try to stick with it and see if it gets better.

## 2015-04-25 NOTE — Telephone Encounter (Signed)
Refill denied.   Patient chart notes allergy to Wellbutrin.

## 2015-04-25 NOTE — Telephone Encounter (Signed)
Patient is calling to speak with you regarding the brintellix that was prescribed for her yesterday if possible  [phone]

## 2015-04-26 NOTE — Telephone Encounter (Signed)
Not sure why this got to sent to me.  Will send to [facility].

## 2015-04-26 NOTE — Telephone Encounter (Signed)
Call placed to patient and patient made aware.   Patient will contact office on Monday to F/U.

## 2015-05-01 ENCOUNTER — Telehealth: Payer: Self-pay | Admitting: Physician Assistant

## 2015-05-01 MED ORDER — ESCITALOPRAM OXALATE 10 MG PO TABS: 10.0000 mg | ORAL_TABLET | Freq: Every day | ORAL | Status: DC

## 2015-05-01 NOTE — Telephone Encounter (Addendum)
Call placed to patient and patient made aware.   Patient reports that she has tried Lexapro in the past, but has no recollection of adverse SE.   Patient is willing to try.

## 2015-05-01 NOTE — Telephone Encounter (Signed)
[phone] PT states that she is returning your call about some medication

## 2015-05-01 NOTE — Telephone Encounter (Signed)
Try lexapro 10mg  once a day instead, better tolerated

## 2015-05-01 NOTE — Telephone Encounter (Signed)
Call placed to patient to inquire about SE of Brintillex.   [place].

## 2015-05-01 NOTE — Telephone Encounter (Signed)
Patient returned call.   States that she continues to have SE of Brintillex. States that she stopped taking Brintillex on Friday.   Reports that she is only taking Xanax at this point. Patient noted tearful over the phone.   MD please advise.

## 2015-05-11 ENCOUNTER — Telehealth: Payer: Self-pay | Admitting: Physician Assistant

## 2015-05-11 NOTE — Telephone Encounter (Signed)
Call placed to patient. LMTRC.  

## 2015-05-11 NOTE — Telephone Encounter (Signed)
[phone] PT is wanting to speak to you about her anti depressant medication ( she didn't go into detail)

## 2015-05-15 NOTE — Telephone Encounter (Signed)
Call placed to patient. LMTRC.  

## 2015-05-16 NOTE — Telephone Encounter (Signed)
Multiple calls placed to patient with no answer and no return call.   Message to be closed.  

## 2015-05-17 ENCOUNTER — Encounter: Payer: Self-pay | Admitting: Physician Assistant

## 2015-06-13 ENCOUNTER — Telehealth: Payer: Self-pay | Admitting: Physician Assistant

## 2015-06-13 MED ORDER — HYDROCODONE-ACETAMINOPHEN 5-325 MG PO TABS: 1.0000 | ORAL_TABLET | Freq: Four times a day (QID) | ORAL | Status: DC | PRN

## 2015-06-13 NOTE — Telephone Encounter (Signed)
Patient calling for rx of her hydrocodone  [phone]

## 2015-06-13 NOTE — Telephone Encounter (Signed)
Ok to refill??  Last office visit 04/24/2015.  Last refill 08/28/2014.

## 2015-06-13 NOTE — Telephone Encounter (Signed)
Prescription printed and patient made aware to come to office to pick up after 2pm on 06/13/2015.

## 2015-06-13 NOTE — Telephone Encounter (Signed)
Okay to refill? 

## 2015-07-03 ENCOUNTER — Other Ambulatory Visit: Payer: Self-pay | Admitting: Family Medicine

## 2015-07-03 NOTE — Telephone Encounter (Signed)
Medication refilled per protocol. 

## 2015-07-16 ENCOUNTER — Telehealth: Payer: Self-pay | Admitting: Physician Assistant

## 2015-07-16 MED ORDER — HYDROCODONE-ACETAMINOPHEN 5-325 MG PO TABS: 1.0000 | ORAL_TABLET | Freq: Four times a day (QID) | ORAL | Status: DC | PRN

## 2015-07-16 NOTE — Telephone Encounter (Signed)
Patient is calling to get rx for her hydrocodone  Call [phone] when ready

## 2015-07-16 NOTE — Telephone Encounter (Signed)
Prescription printed

## 2015-07-16 NOTE — Telephone Encounter (Signed)
Okay to refill? 

## 2015-07-16 NOTE — Telephone Encounter (Signed)
Patient made aware to come to office to pick up on 07/17/2015.

## 2015-07-16 NOTE — Telephone Encounter (Signed)
Ok to refill??  Last office visit 04/24/2015.  Last refill 06/13/2015.

## 2015-08-02 ENCOUNTER — Encounter: Payer: Self-pay | Admitting: *Deleted

## 2015-08-02 ENCOUNTER — Other Ambulatory Visit: Payer: Self-pay | Admitting: Family Medicine

## 2015-08-02 NOTE — Telephone Encounter (Signed)
LRF 04/24/15 #60 + 2.  LOV 04/24/15  OK refill?

## 2015-08-03 NOTE — Telephone Encounter (Signed)
okay

## 2015-08-03 NOTE — Telephone Encounter (Signed)
Medication refilled per protocol. 

## 2015-08-20 ENCOUNTER — Other Ambulatory Visit: Payer: Self-pay | Admitting: Physician Assistant

## 2015-08-20 ENCOUNTER — Ambulatory Visit: Payer: BC Managed Care – PPO | Admitting: Family Medicine

## 2015-08-20 NOTE — Telephone Encounter (Signed)
Refill appropriate and filled per protocol. 

## 2015-08-28 ENCOUNTER — Ambulatory Visit (INDEPENDENT_AMBULATORY_CARE_PROVIDER_SITE_OTHER): Payer: BC Managed Care – PPO | Admitting: Family Medicine

## 2015-08-28 ENCOUNTER — Other Ambulatory Visit: Payer: Self-pay | Admitting: Family Medicine

## 2015-08-28 ENCOUNTER — Encounter: Payer: Self-pay | Admitting: Family Medicine

## 2015-08-28 VITALS — BP 118/78 | HR 72 | Temp 97.2°F | Resp 16 | Ht 62.0 in | Wt 168.0 lb

## 2015-08-28 DIAGNOSIS — G4700 Insomnia, unspecified: Secondary | ICD-10-CM

## 2015-08-28 DIAGNOSIS — F419 Anxiety disorder, unspecified: Secondary | ICD-10-CM | POA: Diagnosis not present

## 2015-08-28 DIAGNOSIS — E8940 Asymptomatic postprocedural ovarian failure: Secondary | ICD-10-CM | POA: Diagnosis not present

## 2015-08-28 DIAGNOSIS — E669 Obesity, unspecified: Secondary | ICD-10-CM | POA: Diagnosis not present

## 2015-08-28 DIAGNOSIS — Z79890 Hormone replacement therapy (postmenopausal): Secondary | ICD-10-CM

## 2015-08-28 DIAGNOSIS — L700 Acne vulgaris: Secondary | ICD-10-CM | POA: Insufficient documentation

## 2015-08-28 MED ORDER — TRAZODONE HCL 50 MG PO TABS: 25.0000 mg | ORAL_TABLET | Freq: Every evening | ORAL | Status: DC | PRN

## 2015-08-28 MED ORDER — ESTRADIOL 1 MG PO TABS: ORAL_TABLET | ORAL | Status: DC

## 2015-08-28 MED ORDER — TRETINOIN 0.1 % EX CREA: TOPICAL_CREAM | Freq: Every day | CUTANEOUS | Status: DC

## 2015-08-28 MED ORDER — ACYCLOVIR 400 MG PO TABS: 800.0000 mg | ORAL_TABLET | Freq: Every day | ORAL | Status: DC

## 2015-08-28 NOTE — Assessment & Plan Note (Signed)
Continue estrace

## 2015-08-28 NOTE — Assessment & Plan Note (Signed)
Continue xanax as needed

## 2015-08-28 NOTE — Assessment & Plan Note (Signed)
Trial of trazodone 25-50mg  at bedtime

## 2015-08-28 NOTE — Progress Notes (Signed)
Patient ID: Sandra Parker, female   DOB: 1976/05/05, 39 y.o.   MRN: [id number]   Subjective:    Patient ID: Sandra Parker, female    DOB: [birth year], 39 y.o.   MRN: [id number]  Patient presents for Medication Review  patient here to follow-up medications. She still requires the use of her Xanax to help with her nerves in her sleep. Her sleep is very poor. She stays up at nighttime worried about her son who is currently in his Occupational hygienist. She takes her Xanax typically twice a day. She has history of anxiety and depression in the past she has tried Lexapro, Zoloft, Paxil, Wellbutrin, Nancie Neas looks and she had some type of reaction with most of these.  She is still taking her as she just describes she still gets some breakthrough hot flashes she is actually had hysterectomy with bilateral oophorectomy at a young age.  She continues to use her pain medicine as needed for her shoulder pain but tries to space this out.  She asked about an acne cream Retnin that she has been using from her friend this helps with the red spots, she gets pimples on her cheeks  Review Of Systems:  GEN- denies fatigue, fever, weight loss,weakness, recent illness HEENT- denies eye drainage, change in vision, nasal discharge, CVS- denies chest pain, palpitations RESP- denies SOB, cough, wheeze ABD- denies N/V, change in stools, abd pain GU- denies dysuria, hematuria, dribbling, incontinence MSK- +joint pain, muscle aches, injury Neuro- denies headache, dizziness, syncope, seizure activity       Objective:    BP 118/78 mmHg  Pulse 72  Temp(Src) 97.2 F (36.2 C) (Oral)  Resp 16  Ht 5\' 2"  (1.575 m)  Wt 168 lb (76.204 kg)  BMI 30.72 kg/m2 GEN- NAD, alert and oriented x3 HEENT- PERRL, EOMI, non injected sclera, pink conjunctiva, MMM, oropharynx clear Neck- Supple, no thyromegaly CVS- RRR, no murmur RESP-CTAB Psych- normal affect and mood Skin- no acne seen, no pusutles, minimal erythema bilat cheeks EXT-  No edema Pulses- Radial, DP- 2+        Assessment & Plan:      Problem List Items Addressed This Visit    Surgical menopause on hormone replacement therapy    Continue estrace       Obesity   Insomnia    Trial of trazodone 25-50mg  at bedtime      Anxiety    Continue xanax as needed      Relevant Medications   traZODone (DESYREL) 50 MG tablet   Acne vulgaris - Primary    No specific acne seen today, advised very small amounts of the retnin cream      Relevant Medications   tretinoin (RETIN-A) 0.1 % cream      Note: This dictation was prepared with Dragon dictation along with smaller phrase technology. Any transcriptional errors that result from this process are unintentional.

## 2015-08-28 NOTE — Patient Instructions (Signed)
Schedule Mammogram Try the Retin cream Try trazodone- start with 1/2 tablet at bedtime for sleep  F/U 6 months

## 2015-08-28 NOTE — Assessment & Plan Note (Signed)
No specific acne seen today, advised very small amounts of the retnin cream

## 2015-08-29 ENCOUNTER — Telehealth: Payer: Self-pay | Admitting: Family Medicine

## 2015-08-29 NOTE — Telephone Encounter (Signed)
Ok to refill??  Last office visit 08/28/2015.  Last refill 08/03/2015.

## 2015-08-29 NOTE — Telephone Encounter (Signed)
Rec'd PA form from [facility] with case [id number].  When submitted said no PA required but also sent PA form.  Form completed and faxed to pharmacy

## 2015-08-29 NOTE — Telephone Encounter (Signed)
Okay to refill give 2 

## 2015-08-29 NOTE — Telephone Encounter (Signed)
Medication called to pharmacy. 

## 2015-08-30 ENCOUNTER — Other Ambulatory Visit: Payer: Self-pay | Admitting: Family Medicine

## 2015-08-30 DIAGNOSIS — Z1231 Encounter for screening mammogram for malignant neoplasm of breast: Secondary | ICD-10-CM

## 2015-09-06 NOTE — Telephone Encounter (Signed)
I had to call [facility].  Rec'd approval for the Tretinoin 0.1% cream  PA [id number].  Pharmacy made aware.  Left pt message

## 2015-09-10 ENCOUNTER — Ambulatory Visit (HOSPITAL_COMMUNITY): Payer: BC Managed Care – PPO

## 2015-09-24 ENCOUNTER — Inpatient Hospital Stay (HOSPITAL_COMMUNITY): Admission: RE | Admit: 2015-09-24 | Payer: BC Managed Care – PPO | Source: Ambulatory Visit

## 2015-09-26 ENCOUNTER — Other Ambulatory Visit: Payer: Self-pay | Admitting: Physician Assistant

## 2015-09-26 NOTE — Telephone Encounter (Signed)
LRF 07/16/15 #30  LOV 08/28/15  OK refill?

## 2015-09-26 NOTE — Telephone Encounter (Signed)
okay

## 2015-09-26 NOTE — Telephone Encounter (Signed)
Patient needs refill on her hydrocodone  [phone]

## 2015-09-27 ENCOUNTER — Encounter: Payer: Self-pay | Admitting: *Deleted

## 2015-09-27 MED ORDER — HYDROCODONE-ACETAMINOPHEN 5-325 MG PO TABS: 1.0000 | ORAL_TABLET | Freq: Four times a day (QID) | ORAL | Status: DC | PRN

## 2015-09-27 NOTE — Telephone Encounter (Addendum)
rx printed, provider to sign Friday.  Called pt and told her she can pick up tomorrow

## 2015-10-05 ENCOUNTER — Ambulatory Visit: Payer: BC Managed Care – PPO | Admitting: Family Medicine

## 2015-10-08 ENCOUNTER — Ambulatory Visit (INDEPENDENT_AMBULATORY_CARE_PROVIDER_SITE_OTHER): Payer: BC Managed Care – PPO | Admitting: Family Medicine

## 2015-10-08 ENCOUNTER — Encounter: Payer: Self-pay | Admitting: Family Medicine

## 2015-10-08 VITALS — BP 122/68 | HR 70 | Temp 98.2°F | Resp 14 | Ht 62.0 in | Wt 166.0 lb

## 2015-10-08 DIAGNOSIS — W57XXXA Bitten or stung by nonvenomous insect and other nonvenomous arthropods, initial encounter: Secondary | ICD-10-CM

## 2015-10-08 DIAGNOSIS — T148 Other injury of unspecified body region: Secondary | ICD-10-CM

## 2015-10-08 MED ORDER — TRIAMCINOLONE ACETONIDE 0.1 % EX CREA: 1.0000 "application " | TOPICAL_CREAM | Freq: Two times a day (BID) | CUTANEOUS | Status: DC

## 2015-10-08 NOTE — Patient Instructions (Signed)
Use the cream twice a day  Take benadryl at night as needed F/U as previous

## 2015-10-08 NOTE — Progress Notes (Signed)
Patient ID: Sandra Parker, female   DOB: 28-Jan-1976, 39 y.o.   MRN: [id number]   Subjective:    Patient ID: Sandra Parker, female    DOB: 1976-10-10, 39 y.o.   MRN: [id number]  Patient presents for Rash pt here with bites on her arms leg in her abdomen. She was at a hotel about a month ago but the rash isn't present for the past 2 weeks. She's been using Benadryl anti-itch she's not had any new lesions. She only feels itching when she sits one of her chairs but her daughter also sits in the chair does not have any itching. She has not noted any bedbugs. She's not had any change in her soap or detergent. Her daughter does not have any rash    Review Of Systems:  GEN- denies fatigue, fever, weight loss,weakness, recent illness HEENT- denies eye drainage, change in vision, nasal discharge, CVS- denies chest pain, palpitations RESP- denies SOB, cough, wheeze ABD- denies N/V, change in stools, abd pain GU- denies dysuria, hematuria, dribbling, incontinence MSK- denies joint pain, muscle aches, injury Neuro- denies headache, dizziness, syncope, seizure activity       Objective:    BP 122/68 mmHg  Pulse 70  Temp(Src) 98.2 F (36.8 C) (Oral)  Resp 14  Ht 5\' 2"  (1.575 m)  Wt 166 lb (75.297 kg)  BMI 30.35 kg/m2 GEN- NAD, alert and oriented x3,well appearing Skin- very small erythematous maculoapapular lesions- few scattered on forearms, ` lesion Right lower leg, 3 lesions on abdomen, other areas clear intact, no burrow lines, no lesions in web spaces        Assessment & Plan:      Problem List Items Addressed This Visit    None    Visit Diagnoses    Bug bites    -  Primary    Trial of TAC cream BID, also to wash all clothing, sheets, and vaccum the chair, based on apperance doubt bed bugs or scabies       Note: This dictation was prepared with Dragon dictation along with smaller phrase technology. Any transcriptional errors that result from this process are unintentional.

## 2015-10-12 ENCOUNTER — Ambulatory Visit: Payer: BC Managed Care – PPO | Admitting: Family Medicine

## 2015-10-15 ENCOUNTER — Other Ambulatory Visit: Payer: Self-pay | Admitting: Family Medicine

## 2015-10-15 ENCOUNTER — Ambulatory Visit: Payer: BC Managed Care – PPO

## 2015-10-15 NOTE — Telephone Encounter (Signed)
Ok to refill 

## 2015-11-12 ENCOUNTER — Telehealth: Payer: Self-pay | Admitting: *Deleted

## 2015-11-12 MED ORDER — ALPRAZOLAM 1 MG PO TABS: ORAL_TABLET | ORAL | Status: DC

## 2015-11-12 NOTE — Telephone Encounter (Signed)
Received fax requesting refill on Xanax.   Ok to refill??  Last office visit 10/08/2015.  Last refill 08/29/2015, #2 refills.

## 2015-11-12 NOTE — Telephone Encounter (Signed)
Okay 

## 2015-11-12 NOTE — Telephone Encounter (Signed)
Medication called to pharmacy. 

## 2015-12-18 ENCOUNTER — Other Ambulatory Visit: Payer: Self-pay | Admitting: Family Medicine

## 2015-12-18 NOTE — Telephone Encounter (Signed)
Ok to refill 

## 2015-12-19 ENCOUNTER — Ambulatory Visit: Payer: BC Managed Care – PPO | Admitting: Physician Assistant

## 2015-12-20 ENCOUNTER — Ambulatory Visit (INDEPENDENT_AMBULATORY_CARE_PROVIDER_SITE_OTHER): Payer: BC Managed Care – PPO | Admitting: Physician Assistant

## 2015-12-20 ENCOUNTER — Encounter: Payer: Self-pay | Admitting: Family Medicine

## 2015-12-20 ENCOUNTER — Encounter: Payer: Self-pay | Admitting: Physician Assistant

## 2015-12-20 VITALS — BP 102/70 | HR 72 | Temp 98.3°F | Resp 18 | Wt 166.0 lb

## 2015-12-20 DIAGNOSIS — J988 Other specified respiratory disorders: Secondary | ICD-10-CM | POA: Diagnosis not present

## 2015-12-20 DIAGNOSIS — B9689 Other specified bacterial agents as the cause of diseases classified elsewhere: Secondary | ICD-10-CM

## 2015-12-20 MED ORDER — FLUTICASONE PROPIONATE 50 MCG/ACT NA SUSP
2.0000 | Freq: Every day | NASAL | Status: DC
Start: 2015-12-20 — End: 2016-01-04

## 2015-12-20 MED ORDER — AZITHROMYCIN 250 MG PO TABS: ORAL_TABLET | ORAL | Status: DC

## 2015-12-20 NOTE — Progress Notes (Signed)
Patient ID: Sandra Parker MRN: [id number], DOB: [birth year], 39 y.o. Date of Encounter: 12/20/2015, 12:11 PM    Chief Complaint:  Chief Complaint  Patient presents with  . c/o sinus infection     HPI: 39 y.o. year old white female points to her nasal bridge area and says that that is where it feels congested. Says that she is not getting much mucus from her nose but mostly drainage down her throat. No chest congestion. No sore throat. No fevers or chills.     Home Meds:   Outpatient Prescriptions Prior to Visit  Medication Sig Dispense Refill  . acyclovir (ZOVIRAX) 400 MG tablet Take 2 tablets (800 mg total) by mouth daily. 60 tablet 3  . ALPRAZolam (XANAX) 1 MG tablet TAKE (1) TABLET BY MOUTH (2) TIMES DAILY AS NEEDED. 60 tablet 2  . estradiol (ESTRACE) 1 MG tablet TAKE ONE & ONE-HALF TABLETS BY MOUTH ONCE DAILY 45 tablet 3  . HYDROcodone-acetaminophen (NORCO/VICODIN) 5-325 MG tablet Take 1 tablet by mouth every 6 (six) hours as needed for moderate pain. 30 tablet 0  . Multiple Vitamin (MULTIVITAMIN) tablet Take 1 tablet by mouth daily. One-a-day    . tretinoin (RETIN-A) 0.1 % cream Apply topically at bedtime. 45 g 2  . traZODone (DESYREL) 50 MG tablet Take 0.5-1 tablets (25-50 mg total) by mouth at bedtime as needed for sleep. (Patient not taking: Reported on 12/20/2015) 30 tablet 3  . triamcinolone cream (KENALOG) 0.1 % Apply 1 application topically 2 (two) times daily. (Patient not taking: Reported on 12/20/2015) 45 g 0   No facility-administered medications prior to visit.    Allergies:  Allergies  Allergen Reactions  . Ciprofloxacin   . Prozac [Fluoxetine] Other (See Comments)    Bad headaches  . Relafen [Nabumetone]   . Wellbutrin [Bupropion]     Bad dreams,  Feel crazy  . Cephalexin Rash      Review of Systems: See HPI for pertinent ROS. All other ROS negative.    Physical Exam: Blood pressure 102/70, pulse 72, temperature 98.3 F (36.8 C), temperature  source Oral, resp. rate 18, weight 166 lb (75.297 kg)., Body mass index is 30.35 kg/(m^2). General:  WNWD WF. Appears in no acute distress. HEENT: Normocephalic, atraumatic, eyes without discharge, sclera non-icteric, nares are without discharge. Bilateral auditory canals clear, TM's are without perforation, pearly grey and translucent with reflective cone of light bilaterally. Oral cavity moist, posterior pharynx without exudate, erythema, peritonsillar abscess. No tenderness with percussion of frontal or maxillary sinuses.  Neck: Supple. No thyromegaly. No lymphadenopathy. Lungs: Clear bilaterally to auscultation without wheezes, rales, or rhonchi. Breathing is unlabored. Heart: Regular rhythm. No murmurs, rubs, or gallops. Msk:  Strength and tone normal for age. Extremities/Skin: Warm and dry.  Neuro: Alert and oriented X 3. Moves all extremities spontaneously. Gait is normal. CNII-XII grossly in tact. Psych:  Responds to questions appropriately with a normal affect.     ASSESSMENT AND PLAN:  39 y.o. year old female with  1. Bacterial respiratory infection She is to use the Flonase as directed for congestion symptoms. Is to take the antibiotic as directed. Follow-up if symptoms do not resolve within 1 week after completion of antibiotic. Allergy to cephalexin therefore avoided amoxicillin. - azithromycin (ZITHROMAX) 250 MG tablet; Day 1: Take 2 daily. Days 2-5: Take 1 daily.  Dispense: 6 tablet; Refill: 0 - fluticasone (FLONASE) 50 MCG/ACT nasal spray; Place 2 sprays into both nostrils daily.  Dispense: 16 g; Refill:  6   Signed, [address] [place], [place], [facility] 12/20/2015 12:11 PM

## 2016-01-03 ENCOUNTER — Telehealth: Payer: Self-pay | Admitting: Family Medicine

## 2016-01-03 DIAGNOSIS — B9689 Other specified bacterial agents as the cause of diseases classified elsewhere: Secondary | ICD-10-CM

## 2016-01-03 DIAGNOSIS — J988 Other specified respiratory disorders: Secondary | ICD-10-CM

## 2016-01-03 NOTE — Telephone Encounter (Signed)
Patient states she was in a week or so ago and received antibiotic she is requesting another round she is still no better.   [phone]

## 2016-01-03 NOTE — Telephone Encounter (Signed)
Find out if symptoms still same---Is it still congestion in nose with drainage down throat?

## 2016-01-03 NOTE — Telephone Encounter (Signed)
To MBD. 

## 2016-01-04 ENCOUNTER — Encounter: Payer: Self-pay | Admitting: Family Medicine

## 2016-01-04 MED ORDER — FLUTICASONE PROPIONATE 50 MCG/ACT NA SUSP: 2.0000 | Freq: Every day | NASAL | Status: DC

## 2016-01-04 MED ORDER — DM-GUAIFENESIN ER 30-600 MG PO TB12: 1.0000 | ORAL_TABLET | Freq: Two times a day (BID) | ORAL | Status: DC | PRN

## 2016-01-04 MED ORDER — AMOXICILLIN-POT CLAVULANATE 875-125 MG PO TABS: 1.0000 | ORAL_TABLET | Freq: Two times a day (BID) | ORAL | Status: DC

## 2016-01-04 NOTE — Telephone Encounter (Signed)
Patient is requesting a note for today please email it to [email or web address]

## 2016-01-04 NOTE — Telephone Encounter (Signed)
Cough, runny nose, whole body hurts, throat is raw.  Can't sleep at night for coughing.

## 2016-01-04 NOTE — Telephone Encounter (Signed)
Pt aware of medications.  Sent work note for today only.

## 2016-01-04 NOTE — Telephone Encounter (Signed)
Send in Augmentin x 7 days, mucinex DM, Flonase

## 2016-02-04 ENCOUNTER — Encounter: Payer: Self-pay | Admitting: Family Medicine

## 2016-02-04 ENCOUNTER — Ambulatory Visit (INDEPENDENT_AMBULATORY_CARE_PROVIDER_SITE_OTHER): Payer: BC Managed Care – PPO | Admitting: Family Medicine

## 2016-02-04 VITALS — BP 122/70 | HR 80 | Temp 99.8°F | Resp 16 | Ht 62.0 in | Wt 165.0 lb

## 2016-02-04 DIAGNOSIS — J069 Acute upper respiratory infection, unspecified: Secondary | ICD-10-CM | POA: Diagnosis not present

## 2016-02-04 LAB — STREP GROUP A AG, W/REFLEX TO CULT: STREGTOCOCCUS GROUP A AG SCREEN: NOT DETECTED

## 2016-02-04 MED ORDER — GUAIFENESIN-CODEINE 100-10 MG/5ML PO SOLN: 5.0000 mL | Freq: Four times a day (QID) | ORAL | Status: DC | PRN

## 2016-02-04 NOTE — Patient Instructions (Signed)
Cepacol throat  Zyrtec/Claritin for drainage Cough medicine F/U as needed

## 2016-02-04 NOTE — Progress Notes (Signed)
Patient ID: Sandra Parker, female   DOB: [birth year], 40 y.o.   MRN: [id number]    Subjective:    Patient ID: Sandra Parker, female    DOB: 12/06/1976, 40 y.o.   MRN: [id number]  Patient presents for Illness Pt here with sore throat, non productive cough, nasal drainage, for past 4 days. No fever or body aches, +sick contacts with work. Taking OTC meds     Review Of Systems:  GEN- denies fatigue, fever, weight loss,weakness, recent illness HEENT- denies eye drainage, change in vision,+ nasal discharge, CVS- denies chest pain, palpitations RESP- denies SOB,+ cough, wheeze ABD- denies N/V, change in stools, abd pain GU- denies dysuria, hematuria, dribbling, incontinence MSK- denies joint pain, muscle aches, injury Neuro- denies headache, dizziness, syncope, seizure activity       Objective:    BP 122/70 mmHg  Pulse 80  Temp(Src) 99.8 F (37.7 C) (Oral)  Resp 16  Ht 5\' 2"  (1.575 m)  Wt 165 lb (74.844 kg)  BMI 30.17 kg/m2 GEN- NAD, alert and oriented x3 HEENT- PERRL, EOMI, non injected sclera, pink conjunctiva, MMM, oropharynx clear  TM clear bilat no effusion, No  maxillary sinus tenderness, inflammed turbinates,  Nasal drainage  Neck- Supple, no LAD CVS- RRR, no murmur RESP-CTAB EXT- No edema Pulses- Radial 2+          Assessment & Plan:      Problem List Items Addressed This Visit    None    Visit Diagnoses    Viral URI    -  Primary    sUPPORTIVE Care, given cough med, OTC anti-histamine,    Relevant Orders    STREP GROUP A AG, W/REFLEX TO CULT (Completed)       Note: This dictation was prepared with Dragon dictation along with smaller phrase technology. Any transcriptional errors that result from this process are unintentional.

## 2016-02-15 ENCOUNTER — Ambulatory Visit (INDEPENDENT_AMBULATORY_CARE_PROVIDER_SITE_OTHER): Payer: BC Managed Care – PPO | Admitting: Family Medicine

## 2016-02-15 ENCOUNTER — Encounter: Payer: Self-pay | Admitting: Family Medicine

## 2016-02-15 VITALS — BP 122/78 | HR 72 | Temp 99.1°F | Resp 16 | Ht 62.0 in | Wt 164.0 lb

## 2016-02-15 DIAGNOSIS — K529 Noninfective gastroenteritis and colitis, unspecified: Secondary | ICD-10-CM | POA: Diagnosis not present

## 2016-02-15 DIAGNOSIS — F419 Anxiety disorder, unspecified: Secondary | ICD-10-CM | POA: Diagnosis not present

## 2016-02-15 MED ORDER — ALPRAZOLAM 0.5 MG PO TABS: ORAL_TABLET | ORAL | Status: DC

## 2016-02-15 MED ORDER — ONDANSETRON HCL 4 MG PO TABS: 4.0000 mg | ORAL_TABLET | Freq: Three times a day (TID) | ORAL | Status: DC | PRN

## 2016-02-15 NOTE — Progress Notes (Signed)
   Subjective:    Patient ID: Sandra Parker, female    DOB: 1976-12-16, 40 y.o.   MRN: [id number]  Patient presents for GI Upset  patient here with GI upset for the past 2 days has had nausea and vomiting she states that someone at her workplace was sick with similar pain. She has not had any fever.  Vomiting is nonbloody nonbilious. She has not taken anything over-the-counter. Diarrhea 3-4x a day, watery stools, no blood. She is cramping with diarrhea.  Long-standing history of anxiety and depression. She will likely decrease the dose of her alprazolam feels like it is now too strong that she is doing well. She's currently on 1 mg twice a day as needed she does not take every day.    Review Of Systems:  GEN- denies fatigue, fever, weight loss,weakness, recent illness HEENT- denies eye drainage, change in vision, nasal discharge, CVS- denies chest pain, palpitations RESP- denies SOB, cough, wheeze ABD-+ N/V, +change in stools, abd pain GU- denies dysuria, hematuria, dribbling, incontinence MSK- denies joint pain, muscle aches, injury Neuro- denies headache, dizziness, syncope, seizure activity       Objective:    BP 122/78 mmHg  Pulse 72  Temp(Src) 99.1 F (37.3 C) (Oral)  Resp 16  Ht 5\' 2"  (1.575 m)  Wt 164 lb (74.39 kg)  BMI 29.99 kg/m2 GEN- NAD, alert and oriented x3 HEENT- PERRL, EOMI, non injected sclera, pink conjunctiva, MMM, oropharynx clear Neck- Supple, no LAD  CVS- RRR, no murmur RESP-CTAB ABD-NABS,soft,ND, mild TTP lower quadrants  Psych- normal affect and mood        Assessment & Plan:      Problem List Items Addressed This Visit    Anxiety    Decrease xanax to 0.5mg  BID prn       Relevant Medications   ALPRAZolam (XANAX) 0.5 MG tablet    Other Visit Diagnoses    Gastroenteritis    -  Primary    Zofran prn, fluids, should improve over the weekend, bland diet, soft foods immodium for > 3-4 bowel movements        Note: This dictation was prepared  with Dragon dictation along with smaller phrase technology. Any transcriptional errors that result from this process are unintentional.

## 2016-02-15 NOTE — Patient Instructions (Addendum)
F/U as previous Xanax decreased to 0.5mg  Okay to give note for today , return Monday  Viral Gastroenteritis Viral gastroenteritis is also called stomach flu. This illness is caused by a certain type of germ (virus). It can cause sudden watery poop (diarrhea) and throwing up (vomiting). This can cause you to lose body fluids (dehydration). This illness usually lasts for 3 to 8 days. It usually goes away on its own. HOME CARE   Drink enough fluids to keep your pee (urine) clear or pale yellow. Drink small amounts of fluids often.  Ask your doctor how to replace body fluid losses (rehydration).  Avoid:  Foods high in sugar.  Alcohol.  Bubbly (carbonated) drinks.  Tobacco.  Juice.  Caffeine drinks.  Very hot or cold fluids.  Fatty, greasy foods.  Eating too much at one time.  Dairy products until 24 to 48 hours after your watery poop stops.  You may eat foods with active cultures (probiotics). They can be found in some yogurts and supplements.  Wash your hands well to avoid spreading the illness.  Only take medicines as told by your doctor. Do not give aspirin to children. Do not take medicines for watery poop (antidiarrheals).  Ask your doctor if you should keep taking your regular medicines.  Keep all doctor visits as told. GET HELP RIGHT AWAY IF:   You cannot keep fluids down.  You do not pee at least once every 6 to 8 hours.  You are short of breath.  You see blood in your poop or throw up. This may look like coffee grounds.  You have belly (abdominal) pain that gets worse or is just in one small spot (localized).  You keep throwing up or having watery poop.  You have a fever.  The patient is a child younger than 3 months, and he or she has a fever.  The patient is a child older than 3 months, and he or she has a fever and problems that do not go away.  The patient is a child older than 3 months, and he or she has a fever and problems that suddenly get  worse.  The patient is a baby, and he or she has no tears when crying. MAKE SURE YOU:   Understand these instructions.  Will watch your condition.  Will get help right away if you are not doing well or get worse.   This information is not intended to replace advice given to you by your health care provider. Make sure you discuss any questions you have with your health care provider.   Document Released: 06/02/2008 Document Revised: 03/08/2012 Document Reviewed: 10/01/2011 Elsevier Interactive Patient Education [employer].

## 2016-02-17 NOTE — Assessment & Plan Note (Signed)
Decrease xanax to 0.5mg  BID prn

## 2016-02-27 ENCOUNTER — Telehealth: Payer: Self-pay | Admitting: *Deleted

## 2016-02-27 NOTE — Telephone Encounter (Signed)
Received call from patient.   Patient is requesting refill on Phentermine.   Advised that she may need OV to review weight.   MD please advise.

## 2016-02-29 NOTE — Telephone Encounter (Signed)
Call placed to patient and patient made aware per VM.  

## 2016-02-29 NOTE — Telephone Encounter (Signed)
Needs OV.  

## 2016-03-14 ENCOUNTER — Telehealth: Payer: Self-pay | Admitting: *Deleted

## 2016-03-14 NOTE — Telephone Encounter (Signed)
Received call from patient.   States that she has been using a new bra and it is holding in moisture. Reports that she has irritation under R breast.   States that she has had this irritation in the past, and used Fanny Cream (Boric Acid 10%, Zinc Oxide, Eucerin, and Vaseline compound) from [employer] to clear it up. Cream requires prescription.   MD please advise.

## 2016-03-14 NOTE — Telephone Encounter (Signed)
Medication called to pharmacy.  Call placed to patient and patient made aware per VM.  

## 2016-03-14 NOTE — Telephone Encounter (Signed)
Okay to send in Crossnore cream compound

## 2016-05-15 ENCOUNTER — Ambulatory Visit: Payer: BC Managed Care – PPO | Admitting: Family Medicine

## 2016-05-21 ENCOUNTER — Encounter: Payer: Self-pay | Admitting: Family Medicine

## 2016-05-30 ENCOUNTER — Telehealth: Payer: Self-pay | Admitting: Family Medicine

## 2016-05-30 NOTE — Telephone Encounter (Signed)
She can use OTC cortisone for now, schedule appt

## 2016-05-30 NOTE — Telephone Encounter (Signed)
Rash on both arms.  Very itchy.  Not sure what from.  Has had for about 3 weeks and getting worse.  Please advise?

## 2016-05-30 NOTE — Telephone Encounter (Signed)
Pt aware, already has appt for Monday

## 2016-06-02 ENCOUNTER — Ambulatory Visit (INDEPENDENT_AMBULATORY_CARE_PROVIDER_SITE_OTHER): Payer: BC Managed Care – PPO | Admitting: Family Medicine

## 2016-06-02 ENCOUNTER — Encounter: Payer: Self-pay | Admitting: Family Medicine

## 2016-06-02 ENCOUNTER — Ambulatory Visit: Payer: BC Managed Care – PPO | Admitting: Family Medicine

## 2016-06-02 VITALS — BP 120/62 | HR 78 | Temp 98.2°F | Resp 12 | Ht 62.0 in | Wt 164.0 lb

## 2016-06-02 DIAGNOSIS — Z13220 Encounter for screening for lipoid disorders: Secondary | ICD-10-CM | POA: Diagnosis not present

## 2016-06-02 DIAGNOSIS — L578 Other skin changes due to chronic exposure to nonionizing radiation: Secondary | ICD-10-CM | POA: Diagnosis not present

## 2016-06-02 LAB — CBC WITH DIFFERENTIAL/PLATELET
BASOS ABS: 0 {cells}/uL (ref 0–200)
Basophils Relative: 0 %
Eosinophils Absolute: 144 cells/uL (ref 15–500)
Eosinophils Relative: 2 %
HCT: 41.4 % (ref 35.0–45.0)
Hemoglobin: 14 g/dL (ref 12.0–15.0)
LYMPHS PCT: 39 %
Lymphs Abs: 2808 cells/uL (ref 850–3900)
MCH: 28.7 pg (ref 27.0–33.0)
MCHC: 33.8 g/dL (ref 32.0–36.0)
MCV: 84.8 fL (ref 80.0–100.0)
MPV: 10.2 fL (ref 7.5–12.5)
Monocytes Absolute: 360 cells/uL (ref 200–950)
Monocytes Relative: 5 %
Neutro Abs: 3888 cells/uL (ref 1500–7800)
Neutrophils Relative %: 54 %
PLATELETS: 218 10*3/uL (ref 140–400)
RBC: 4.88 MIL/uL (ref 3.80–5.10)
RDW: 13.3 % (ref 11.0–15.0)
WBC: 7.2 10*3/uL (ref 3.8–10.8)

## 2016-06-02 LAB — COMPREHENSIVE METABOLIC PANEL
ALT: 28 U/L (ref 6–29)
AST: 19 U/L (ref 10–30)
Albumin: 4.4 g/dL (ref 3.6–5.1)
Alkaline Phosphatase: 69 U/L (ref 33–115)
BUN: 14 mg/dL (ref 7–25)
CHLORIDE: 102 mmol/L (ref 98–110)
CO2: 26 mmol/L (ref 20–31)
CREATININE: 0.91 mg/dL (ref 0.50–1.10)
Calcium: 9.7 mg/dL (ref 8.6–10.2)
GLUCOSE: 91 mg/dL (ref 70–99)
POTASSIUM: 4.3 mmol/L (ref 3.5–5.3)
Sodium: 138 mmol/L (ref 135–146)
TOTAL PROTEIN: 7.5 g/dL (ref 6.1–8.1)
Total Bilirubin: 0.5 mg/dL (ref 0.2–1.2)

## 2016-06-02 LAB — LIPID PANEL
Cholesterol: 257 mg/dL — ABNORMAL HIGH (ref 125–200)
HDL: 59 mg/dL (ref >=46)
LDL CALC: 169 mg/dL — AB (ref <130)
TRIGLYCERIDES: 145 mg/dL (ref <150)
Total CHOL/HDL Ratio: 4.4 Ratio (ref <=5.0)
VLDL: 29 mg/dL (ref <30)

## 2016-06-02 MED ORDER — METHYLPREDNISOLONE ACETATE 40 MG/ML IJ SUSP
40.0000 mg | Freq: Once | INTRAMUSCULAR | Status: AC
  Administered 2016-06-02: 40 mg via INTRAMUSCULAR

## 2016-06-02 MED ORDER — PREDNISONE 20 MG PO TABS: 20.0000 mg | ORAL_TABLET | Freq: Every day | ORAL | Status: DC

## 2016-06-02 NOTE — Patient Instructions (Signed)
Start prednisone tomorrow Continue all other medications We will call with labs  Give note for work for today  F/U 6 months for Physical

## 2016-06-02 NOTE — Progress Notes (Signed)
    Subjective:    Patient ID: Sandra Parker, female    DOB: Jul 01, 1976, 40 y.o.   MRN: [id number]  Patient presents for Medication Review/ Refill and Skin Irritation  Patient with itchy rash in the past 2 weeks. She states that she was in a tanning booth a few weeks ago but then went to the pool about 2 weeks ago after being out in the sun she noticed a red itchy rash became on her arms or legs across her chest. She's been using the Benadryl spray and taking Benadryl by mouth and is significantly periodic. She's not had any fever and her joint pain. She has not had any tick bites no other change in soap or detergent.  She's also due for fasting labs and left a cholesterol check. Review Of Systems:  GEN- denies fatigue, fever, weight loss,weakness, recent illness HEENT- denies eye drainage, change in vision, nasal discharge, CVS- denies chest pain, palpitations RESP- denies SOB, cough, wheeze ABD- denies N/V, change in stools, abd pain GU- denies dysuria, hematuria, dribbling, incontinence MSK- denies joint pain, muscle aches, injury Neuro- denies headache, dizziness, syncope, seizure activity       Objective:    BP 120/62 mmHg  Pulse 78  Temp(Src) 98.2 F (36.8 C) (Oral)  Resp 12  Ht 5\' 2"  (1.575 m)  Wt 164 lb (74.39 kg)  BMI 29.99 kg/m2 GEN- NAD, alert and oriented x3 HEENT- PERRL, EOMI, non injected sclera, pink conjunctiva, MMM, oropharynx clear Neck- Supple, no LAD  CVS- RRR, no murmur RESP-CTAB Skin- erythematous maculopolar lesions across forearms, thighs, few on chest, mild erythema on shoulders, few lesions on chin  EXT- No edema Pulses- Radial, 2+        Assessment & Plan:      Problem List Items Addressed This Visit    None    Visit Diagnoses    Dermatitis due to sun    -  Primary    Based on history possible sun poisoning, nother other contact/exposure, no new meds. Given Depo Medrol, prednisone burst    Relevant Medications    methylPREDNISolone  acetate (DEPO-MEDROL) injection 40 mg (Completed)    Other Relevant Orders    CBC with Differential/Platelet    Comprehensive metabolic panel    Screening cholesterol level        Relevant Orders    Lipid panel       Note: This dictation was prepared with Dragon dictation along with smaller phrase technology. Any transcriptional errors that result from this process are unintentional.

## 2016-06-03 ENCOUNTER — Encounter: Payer: Self-pay | Admitting: *Deleted

## 2016-06-04 ENCOUNTER — Telehealth: Payer: Self-pay | Admitting: *Deleted

## 2016-06-04 NOTE — Telephone Encounter (Signed)
Received call from patient.   Requested refill on phentermine.   Ok to refill?

## 2016-06-04 NOTE — Telephone Encounter (Signed)
Call placed to patient and patient made aware per VM.  

## 2016-06-04 NOTE — Telephone Encounter (Signed)
I dont think she neesd at this time Weight is lowest over past year

## 2016-06-24 ENCOUNTER — Telehealth: Payer: Self-pay | Admitting: Family Medicine

## 2016-06-24 NOTE — Telephone Encounter (Signed)
Patient calling with questions about her lab results  She said she would be at her desk until 8:45 [phone]

## 2016-06-24 NOTE — Telephone Encounter (Signed)
Call placed to patient. LMTRC.  

## 2016-06-25 NOTE — Telephone Encounter (Signed)
Call placed to patient. LMTRC.  

## 2016-06-26 NOTE — Telephone Encounter (Signed)
Multiple calls placed to patient with no answer and no return call.   Message to be closed.  

## 2016-07-15 ENCOUNTER — Telehealth: Payer: Self-pay | Admitting: Family Medicine

## 2016-07-15 NOTE — Telephone Encounter (Signed)
MD please advise

## 2016-07-15 NOTE — Telephone Encounter (Signed)
Call placed to patient. LMTRC.  

## 2016-07-15 NOTE — Telephone Encounter (Signed)
Patient would like a prescription for a med that would help her when she goes to the beach to not get fever blisters  [phone]

## 2016-07-15 NOTE — Telephone Encounter (Signed)
Clarify this please, If she is asking for fever blister medication, okay to send Valtrex 1000mg  po BID x 1 day, only at onset of fever blister

## 2016-07-16 MED ORDER — VALACYCLOVIR HCL 1 G PO TABS: 1000.0000 mg | ORAL_TABLET | Freq: Two times a day (BID) | ORAL | Status: DC

## 2016-07-16 NOTE — Telephone Encounter (Signed)
It is the fever blister med that she needs, for onset of them at the beach and she does want to valtrex  [employer]

## 2016-07-16 NOTE — Telephone Encounter (Signed)
Medication called/sent to requested pharmacy  

## 2016-08-11 ENCOUNTER — Encounter: Payer: Self-pay | Admitting: Family Medicine

## 2016-08-11 ENCOUNTER — Ambulatory Visit (INDEPENDENT_AMBULATORY_CARE_PROVIDER_SITE_OTHER): Payer: BC Managed Care – PPO | Admitting: Family Medicine

## 2016-08-11 VITALS — BP 116/74 | HR 80 | Temp 98.1°F | Resp 12 | Ht 62.0 in | Wt 161.0 lb

## 2016-08-11 DIAGNOSIS — F419 Anxiety disorder, unspecified: Secondary | ICD-10-CM

## 2016-08-11 DIAGNOSIS — F32A Depression, unspecified: Secondary | ICD-10-CM

## 2016-08-11 DIAGNOSIS — F329 Major depressive disorder, single episode, unspecified: Secondary | ICD-10-CM | POA: Diagnosis not present

## 2016-08-11 MED ORDER — VENLAFAXINE HCL ER 37.5 MG PO CP24: ORAL_CAPSULE | ORAL | 1 refills | Status: DC

## 2016-08-11 NOTE — Patient Instructions (Signed)
Take effexor as prescribed Start with 1 tablet for 1 week, then increase to 2 tablets Take xanax for sleep F/U 4 weeks

## 2016-08-11 NOTE — Progress Notes (Signed)
   Subjective:    Patient ID: Sandra Parker, female    DOB: [birth year], 40 y.o.   MRN: [id number]  Patient presents for Medication Management (is not fasting) Patient to discuss medication. The past few weeks she's felt very uneasy she's been worrying about everything she cannot pinpoint anything particular. States there's been some changes at work but her job is not in jeopardy she's not had any bad reviews. Her son is in the [employer] and overall is doing well he does come home on the weekends he seems to be in good health. Her daughter is living with her she is doing well. She is not sure why she feels so uneasy and upset she is anxious all the time. Her daughter has noticed that if things aren't perfect she will just have spells where she gets very tearful or cries or she just seems on edge all the time. She states she does not know why she is feeling this way she is not sleeping very well. She does use her Xanax very intermittently and it does help calm her down some. She wants to try going back on her daily maintenance medication. She has been on multiple medications in the past. Her only concern is weight gain.    Review Of Systems:  GEN- denies fatigue, fever, weight loss,weakness, recent illness HEENT- denies eye drainage, change in vision, nasal discharge, CVS- denies chest pain, palpitations RESP- denies SOB, cough, wheeze ABD- denies N/V, change in stools, abd pain GU- denies dysuria, hematuria, dribbling, incontinence MSK- denies joint pain, muscle aches, injury Neuro- denies headache, dizziness, syncope, seizure activity       Objective:    BP 116/74 (BP Location: Left Arm, Patient Position: Sitting, Cuff Size: Normal)   Pulse 80   Temp 98.1 F (36.7 C) (Oral)   Resp 12   Ht 5\' 2"  (1.575 m)   Wt 161 lb (73 kg)   BMI 29.45 kg/m  GEN- NAD, alert and oriented x3 Psych- tearful, depressed affect, no SI, not anxious appearing, normal speech, well groomed       Assessment &  Plan:      Problem List Items Addressed This Visit    Depression   Relevant Medications   venlafaxine XR (EFFEXOR XR) 37.5 MG 24 hr capsule   Anxiety - Primary    She has history of both anxiety and major depression. We cannot pinpoint in today's office visit what particularly has her feeling so anxious and sad and depressed. She states that she is going to come fine and her mother this week with her. She does agree that she needs medication. Hopefully her next visit she will open up a little bit more would have a better idea what she needs to work through. I will start her on Effexor will start her at 37.5 mg she will increase to 75 mg after one week. She will use her Xanax at bedtime to help her sleep and bridge with this medication. She will call for any concerns.      Relevant Medications   venlafaxine XR (EFFEXOR XR) 37.5 MG 24 hr capsule    Other Visit Diagnoses   None.     Note: This dictation was prepared with Dragon dictation along with smaller phrase technology. Any transcriptional errors that result from this process are unintentional.

## 2016-08-11 NOTE — Assessment & Plan Note (Signed)
She has history of both anxiety and major depression. We cannot pinpoint in today's office visit what particularly has her feeling so anxious and sad and depressed. She states that she is going to come fine and her mother this week with her. She does agree that she needs medication. Hopefully her next visit she will open up a little bit more would have a better idea what she needs to work through. I will start her on Effexor will start her at 37.5 mg she will increase to 75 mg after one week. She will use her Xanax at bedtime to help her sleep and bridge with this medication. She will call for any concerns.

## 2016-08-12 ENCOUNTER — Telehealth: Payer: Self-pay | Admitting: Family Medicine

## 2016-08-12 NOTE — Telephone Encounter (Signed)
Pt wants to know if she can receive a letter for work since she didn't return today. She received a letter for missing yesterday and is wondering if a letter for today can be emailed or faxed to her. Please give her a phone call regarding this.  Pt's ph# [phone] Thank you.

## 2016-08-12 NOTE — Telephone Encounter (Signed)
Call placed to patient to inquire.   Reports that she did not go to work today d/t multiple issues that she discussed during her OV.   Requested letter to be e-mailed to her at [email or web address].   MD please advise.

## 2016-08-12 NOTE — Telephone Encounter (Signed)
Okay to give note for today, needs to be faxed and not emailed

## 2016-08-14 NOTE — Telephone Encounter (Signed)
Letter faxed.

## 2016-08-15 ENCOUNTER — Ambulatory Visit: Payer: BC Managed Care – PPO | Admitting: Family Medicine

## 2016-09-08 ENCOUNTER — Other Ambulatory Visit: Payer: Self-pay | Admitting: Family Medicine

## 2016-09-09 ENCOUNTER — Ambulatory Visit: Payer: BC Managed Care – PPO | Admitting: Family Medicine

## 2016-09-11 ENCOUNTER — Encounter: Payer: Self-pay | Admitting: Family Medicine

## 2016-09-11 ENCOUNTER — Other Ambulatory Visit: Payer: Self-pay | Admitting: Family Medicine

## 2016-09-11 ENCOUNTER — Ambulatory Visit (INDEPENDENT_AMBULATORY_CARE_PROVIDER_SITE_OTHER): Payer: BC Managed Care – PPO | Admitting: Physician Assistant

## 2016-09-11 ENCOUNTER — Encounter: Payer: Self-pay | Admitting: Physician Assistant

## 2016-09-11 VITALS — BP 118/74 | HR 77 | Temp 97.7°F | Resp 14 | Wt 159.0 lb

## 2016-09-11 DIAGNOSIS — R42 Dizziness and giddiness: Secondary | ICD-10-CM

## 2016-09-11 DIAGNOSIS — J00 Acute nasopharyngitis [common cold]: Secondary | ICD-10-CM

## 2016-09-11 DIAGNOSIS — H6121 Impacted cerumen, right ear: Secondary | ICD-10-CM

## 2016-09-11 MED ORDER — MECLIZINE HCL 25 MG PO TABS: 25.0000 mg | ORAL_TABLET | Freq: Three times a day (TID) | ORAL | 0 refills | Status: DC | PRN

## 2016-09-11 NOTE — Progress Notes (Signed)
Patient ID: Sandra Parker MRN: [id number], DOB: 08-04-1976, 40 y.o. Date of Encounter: 09/11/2016, 10:41 AM    Chief Complaint:  Chief Complaint  Patient presents with  . Ear Pain    right ear,nausea, drainage, 4days     HPI: 40 y.o. year old female presents with above.   Says that her right ear feels irritated. Says that she is feeling a little bit nauseous and a little bit dizzy. Says that she is also having some nasal drainage that is clear and drainage down her throat. Isn't sure if it's this drainage that is causing all of these symptoms or exactly what is going on. No other complaints or concerns. No sore throat. No chest congestion. No fevers or chills. No abdominal pain. No vomiting or diarrhea.     Home Meds:   Outpatient Medications Prior to Visit  Medication Sig Dispense Refill  . ALPRAZolam (XANAX) 0.5 MG tablet TAKE (1) TABLET BY MOUTH (2) TIMES DAILY AS NEEDED. 60 tablet 1  . estradiol (ESTRACE) 1 MG tablet TAKE ONE AND ONE HALF TABLETS BY MOUTH ONCE DAILY 45 tablet 3  . Multiple Vitamin (MULTIVITAMIN) tablet Take 1 tablet by mouth daily. One-a-day    . acyclovir (ZOVIRAX) 400 MG tablet Take 2 tablets (800 mg total) by mouth daily. (Patient not taking: Reported on 09/11/2016) 60 tablet 3  . HYDROcodone-acetaminophen (NORCO/VICODIN) 5-325 MG tablet Take 1 tablet by mouth every 6 (six) hours as needed for moderate pain. (Patient not taking: Reported on 09/11/2016) 30 tablet 0  . venlafaxine XR (EFFEXOR XR) 37.5 MG 24 hr capsule Take 1 tablet for 1 week, then increase to 2 tablets (Patient not taking: Reported on 09/11/2016) 30 capsule 1   No facility-administered medications prior to visit.     Allergies:  Allergies  Allergen Reactions  . Ciprofloxacin   . Prozac [Fluoxetine] Other (See Comments)    Bad headaches  . Relafen [Nabumetone]   . Wellbutrin [Bupropion]     Bad dreams,  Feel crazy  . Cephalexin Rash      Review of Systems: See HPI for pertinent ROS.  All other ROS negative.    Physical Exam: Blood pressure 118/74, pulse 77, temperature 97.7 F (36.5 C), temperature source Oral, resp. rate 14, weight 159 lb (72.1 kg)., Body mass index is 29.08 kg/m. General:  WNWD WF. Appears in no acute distress. HEENT: Normocephalic, atraumatic, eyes without discharge, sclera non-icteric, nares are without discharge. Left ear canal clear, TM without perforation, pearly grey and translucent with reflective cone of light.  Right Ear canal with cerumen impaction.  This is irrigated.  Post irrigation--re-examined---part of canal with erythema c/w irritation secondary to irrigation. Streak of erythema on TM secondary to irritation from cerumen. Remainder of TM clear, normal. Oral cavity moist, posterior pharynx without exudate, erythema, peritonsillar abscess, or post nasal drip.  Neck: Supple. No thyromegaly. No lymphadenopathy. Lungs: Clear bilaterally to auscultation without wheezes, rales, or rhonchi. Breathing is unlabored. Heart: Regular rhythm. No murmurs, rubs, or gallops. Msk:  Strength and tone normal for age. Extremities/Skin: Warm and dry. Neuro: Alert and oriented X 3. Moves all extremities spontaneously. Gait is normal. CNII-XII grossly in tact. Psych:  Responds to questions appropriately with a normal affect.     ASSESSMENT AND PLAN:  40 y.o. year old female with  1. Cerumen impaction, right --Irrigated  2. Vertigo - meclizine (ANTIVERT) 25 MG tablet; Take 1 tablet (25 mg total) by mouth 3 (three) times daily as needed  for dizziness.  Dispense: 30 tablet; Refill: 0  3. Acute rhinitis --Use Zyrtec to dry up drainage.  Suspect that most of her symptoms were secondary to cerumen impaction irritating ear. Suspect that most of her symptoms will resolve in the next few hours post irrigation. Can use Zyrtec to help dry up drainage. If she continues to feel some dizziness and nausea, she can use the meclizine. Follow-up if the symptoms are  not controlled/resolved over the next couple of days.  Signed, [address] [place], [place], [facility] 09/11/2016 10:41 AM

## 2016-09-23 ENCOUNTER — Ambulatory Visit: Payer: BC Managed Care – PPO

## 2016-09-29 ENCOUNTER — Inpatient Hospital Stay: Admission: RE | Admit: 2016-09-29 | Payer: BC Managed Care – PPO | Source: Ambulatory Visit

## 2016-10-22 DIAGNOSIS — Z90710 Acquired absence of both cervix and uterus: Secondary | ICD-10-CM | POA: Insufficient documentation

## 2016-12-03 ENCOUNTER — Encounter: Payer: BC Managed Care – PPO | Admitting: Family Medicine

## 2016-12-25 ENCOUNTER — Telehealth: Payer: Self-pay | Admitting: Family Medicine

## 2016-12-25 ENCOUNTER — Other Ambulatory Visit: Payer: Self-pay | Admitting: Family Medicine

## 2017-01-02 ENCOUNTER — Encounter: Payer: Self-pay | Admitting: Family Medicine

## 2017-01-02 ENCOUNTER — Telehealth: Payer: Self-pay | Admitting: *Deleted

## 2017-01-02 ENCOUNTER — Ambulatory Visit (INDEPENDENT_AMBULATORY_CARE_PROVIDER_SITE_OTHER): Payer: BC Managed Care – PPO | Admitting: Family Medicine

## 2017-01-02 VITALS — BP 110/80 | HR 78 | Temp 98.5°F | Resp 16 | Wt 157.0 lb

## 2017-01-02 DIAGNOSIS — R079 Chest pain, unspecified: Secondary | ICD-10-CM | POA: Diagnosis not present

## 2017-01-02 NOTE — Telephone Encounter (Signed)
Patient called office to schedule appointment for chest pain.   Patient reports x2 days of muscle like pain above L breast around clavicle. Denies radiation of pain, SOB, heart beat irregularities as far as she can tell. Denies pain to other regions. Does report that pain worsens when she tries to take deep breath. States that she thinks she may have pulled something, but does not recall injury to area.   Appointment scheduled at 11am.   MD to be made aware.

## 2017-01-02 NOTE — Progress Notes (Signed)
Subjective:    Patient ID: Sandra Parker, female    DOB: [birth year], 41 y.o.   MRN: [id number]  HPI Patient presents with chest pain. Pain is located in the left upper anterior chest. It is worse with deep inspiration and movement of the left arm.  It began after the patient took down her Christmas tree and she thinks she may have strained a muscle doing so. She denies any angina. She denies any shortness of breath. She denies any cough or fever. She denies any hemoptysis. She denies any heartburn. There is no skin rash. Pain is reproduced by palpation of the chest at the left upper anterior chest under the collarbone. EKG shows normal sinus rhythm with no evidence of ischemia or infarction or pericarditis Past Medical History:  Diagnosis Date  . Anxiety   . Atrophic vaginitis   . Constipation   . Depression    Past Surgical History:  Procedure Laterality Date  . ABDOMINAL HYSTERECTOMY     Current Outpatient Prescriptions on File Prior to Visit  Medication Sig Dispense Refill  . acyclovir (ZOVIRAX) 400 MG tablet TAKE TWO TABLETS BY MOUTH ONCE DAILY 60 tablet 3  . ALPRAZolam (XANAX) 0.5 MG tablet TAKE (1) TABLET BY MOUTH (2) TIMES DAILY AS NEEDED. 60 tablet 1  . estradiol (ESTRACE) 1 MG tablet TAKE ONE AND ONE HALF TABLETS BY MOUTH ONCE DAILY 45 tablet 3  . meclizine (ANTIVERT) 25 MG tablet Take 1 tablet (25 mg total) by mouth 3 (three) times daily as needed for dizziness. 30 tablet 0  . Multiple Vitamin (MULTIVITAMIN) tablet Take 1 tablet by mouth daily. One-a-day    . venlafaxine XR (EFFEXOR-XR) 37.5 MG 24 hr capsule TAKE 1 CAPSULE BY MOUTH FOR 1 WEEK, THEN INCREASE TO 2 CAPSULES DAILY. 30 capsule 0  . HYDROcodone-acetaminophen (NORCO/VICODIN) 5-325 MG tablet Take 1 tablet by mouth every 6 (six) hours as needed for moderate pain. (Patient not taking: Reported on 01/02/2017) 30 tablet 0   No current facility-administered medications on file prior to visit.    Allergies  Allergen Reactions   . Ciprofloxacin   . Prozac [Fluoxetine] Other (See Comments)    Bad headaches  . Relafen [Nabumetone]   . Wellbutrin [Bupropion]     Bad dreams,  Feel crazy  . Cephalexin Rash   Social History   Social History  . Marital status: Divorced    Spouse name: N/A  . Number of children: N/A  . Years of education: N/A   Occupational History  . Not on file.   Social History Main Topics  . Smoking status: Never Smoker  . Smokeless tobacco: Never Used  . Alcohol use Yes  . Drug use: No  . Sexual activity: Not on file   Other Topics Concern  . Not on file   Social History Narrative  . No narrative on file      Review of Systems  All other systems reviewed and are negative.      Objective:   Physical Exam  Constitutional: She appears well-developed and well-nourished. No distress.  Neck: No JVD present.  Cardiovascular: Normal rate, regular rhythm and normal heart sounds.   Pulmonary/Chest: Effort normal and breath sounds normal. No respiratory distress. She has no wheezes. She has no rales. She exhibits tenderness.  Abdominal: Soft. Bowel sounds are normal. She exhibits no distension and no mass. There is no tenderness. There is no rebound and no guarding.  Lymphadenopathy:    She has no cervical  adenopathy.  Skin: She is not diaphoretic.  Vitals reviewed.         Assessment & Plan:  Chest wall pain. I believe the patient strained the chest wall muscle such as the pectoralis while taking down the Christmas tree. There is no evidence on her exam or on her EKG of a serious life-threatening medical condition. I reassured the patient. I recommended that she take naproxen twice daily as needed for pain. Symptoms should gradually improve over the next 2 weeks. Recheck immediately should pain worsen

## 2017-01-26 ENCOUNTER — Ambulatory Visit (INDEPENDENT_AMBULATORY_CARE_PROVIDER_SITE_OTHER): Payer: BC Managed Care – PPO | Admitting: Physician Assistant

## 2017-01-26 ENCOUNTER — Encounter: Payer: Self-pay | Admitting: Physician Assistant

## 2017-01-26 VITALS — BP 120/84 | HR 70 | Temp 97.7°F | Resp 16 | Wt 156.2 lb

## 2017-01-26 DIAGNOSIS — J988 Other specified respiratory disorders: Secondary | ICD-10-CM

## 2017-01-26 DIAGNOSIS — B9789 Other viral agents as the cause of diseases classified elsewhere: Secondary | ICD-10-CM

## 2017-01-26 MED ORDER — HYDROCODONE-HOMATROPINE 5-1.5 MG/5ML PO SYRP: 5.0000 mL | ORAL_SOLUTION | Freq: Three times a day (TID) | ORAL | 0 refills | Status: DC | PRN

## 2017-01-26 NOTE — Progress Notes (Signed)
Patient ID: Sandra Parker MRN: [id number], DOB: 1976-12-24, 41 y.o. Date of Encounter: 01/26/2017, 12:12 PM    Chief Complaint:  Chief Complaint  Patient presents with  . chest burning    x3 days  . Cough  . slight fever off/on     HPI: 41 y.o. year old female presents with above.   She has had a cough--says it is a dry cough-- for a few days now. When she coughs it feels like her chest kind of burns. Has had no fever or chills. Minimal nasal congestion, minimal drainage from the nose, minimal sore throat. No known flu exposure.     Home Meds:   Outpatient Medications Prior to Visit  Medication Sig Dispense Refill  . acyclovir (ZOVIRAX) 400 MG tablet TAKE TWO TABLETS BY MOUTH ONCE DAILY 60 tablet 3  . ALPRAZolam (XANAX) 0.5 MG tablet TAKE (1) TABLET BY MOUTH (2) TIMES DAILY AS NEEDED. 60 tablet 1  . estradiol (ESTRACE) 1 MG tablet TAKE ONE AND ONE HALF TABLETS BY MOUTH ONCE DAILY 45 tablet 3  . HYDROcodone-acetaminophen (NORCO/VICODIN) 5-325 MG tablet Take 1 tablet by mouth every 6 (six) hours as needed for moderate pain. 30 tablet 0  . meclizine (ANTIVERT) 25 MG tablet Take 1 tablet (25 mg total) by mouth 3 (three) times daily as needed for dizziness. 30 tablet 0  . Multiple Vitamin (MULTIVITAMIN) tablet Take 1 tablet by mouth daily. One-a-day    . venlafaxine XR (EFFEXOR-XR) 37.5 MG 24 hr capsule TAKE 1 CAPSULE BY MOUTH FOR 1 WEEK, THEN INCREASE TO 2 CAPSULES DAILY. 30 capsule 0   No facility-administered medications prior to visit.     Allergies:  Allergies  Allergen Reactions  . Ciprofloxacin   . Prozac [Fluoxetine] Other (See Comments)    Bad headaches  . Relafen [Nabumetone]   . Wellbutrin [Bupropion]     Bad dreams,  Feel crazy  . Cephalexin Rash      Review of Systems: See HPI for pertinent ROS. All other ROS negative.    Physical Exam: Blood pressure 120/84, pulse 70, temperature 97.7 F (36.5 C), temperature source Oral, resp. rate 16, weight 156 lb  3.2 oz (70.9 kg), SpO2 98 %., Body mass index is 28.57 kg/m. General:  WNWD WF. Does not sound congested when she talks. Has not coughed any during visit. Appears in no acute distress. HEENT: Normocephalic, atraumatic, eyes without discharge, sclera non-icteric, nares are without discharge. Bilateral auditory canals clear, TM's are without perforation, pearly grey and translucent with reflective cone of light bilaterally. Oral cavity moist, posterior pharynx without exudate, erythema, peritonsillar abscess.   Neck: Supple. No thyromegaly. No lymphadenopathy. Lungs: Clear bilaterally to auscultation without wheezes, rales, or rhonchi. Breathing is unlabored. Heart: Regular rhythm. No murmurs, rubs, or gallops. Msk:  Strength and tone normal for age. Extremities/Skin: Warm and dry. Neuro: Alert and oriented X 3. Moves all extremities spontaneously. Gait is normal. CNII-XII grossly in tact. Psych:  Responds to questions appropriately with a normal affect.     ASSESSMENT AND PLAN:  41 y.o. year old female with  1. Viral respiratory infection Will give Hycodan to use for symptom relief. Hopefully this will suppress the cough and therefore suppress the burning sensation in her chest that she has with cough. Follow-up if develops any fever or if symptoms worsen significantly or if symptoms persist greater than 10 days. - HYDROcodone-homatropine (HYCODAN) 5-1.5 MG/5ML syrup; Take 5 mLs by mouth every 8 (eight) hours as needed  for cough.  Dispense: 120 mL; Refill: 0   Signed, [address] [place], [place], [facility] 01/26/2017 12:12 PM

## 2017-02-03 ENCOUNTER — Other Ambulatory Visit: Payer: Self-pay | Admitting: Family Medicine

## 2017-02-03 MED ORDER — VENLAFAXINE HCL ER 75 MG PO CP24: 75.0000 mg | ORAL_CAPSULE | Freq: Every day | ORAL | 2 refills | Status: DC

## 2017-02-03 NOTE — Telephone Encounter (Signed)
Medication refilled per protocol. 

## 2017-04-10 ENCOUNTER — Other Ambulatory Visit: Payer: Self-pay | Admitting: Family Medicine

## 2017-04-10 NOTE — Telephone Encounter (Signed)
Ok to refill??  Last office visit 01/26/2017.   Last refill 02/14/2017, #1 refills.

## 2017-04-10 NOTE — Telephone Encounter (Signed)
Okay to refill? 

## 2017-04-10 NOTE — Telephone Encounter (Signed)
Medication called to pharmacy. 

## 2017-04-16 ENCOUNTER — Ambulatory Visit: Payer: BC Managed Care – PPO | Admitting: Physician Assistant

## 2017-04-17 ENCOUNTER — Other Ambulatory Visit: Payer: Self-pay | Admitting: Family Medicine

## 2017-05-10 ENCOUNTER — Other Ambulatory Visit: Payer: Self-pay | Admitting: Family Medicine

## 2017-06-12 ENCOUNTER — Ambulatory Visit (INDEPENDENT_AMBULATORY_CARE_PROVIDER_SITE_OTHER): Payer: BC Managed Care – PPO | Admitting: Family Medicine

## 2017-06-12 ENCOUNTER — Encounter: Payer: Self-pay | Admitting: Family Medicine

## 2017-06-12 DIAGNOSIS — R42 Dizziness and giddiness: Secondary | ICD-10-CM

## 2017-06-12 MED ORDER — MECLIZINE HCL 25 MG PO TABS: 25.0000 mg | ORAL_TABLET | Freq: Three times a day (TID) | ORAL | 0 refills | Status: DC | PRN

## 2017-06-12 NOTE — Progress Notes (Signed)
   Subjective:    Patient ID: Sandra Parker, female    DOB: 02/02/1976, 41 y.o.   MRN: [id number]  Patient presents for Vertigo (xweeks- intermittent episodes- states that she feels like room is spinning and computer screen is blurry)   Has had dizziness for the past few weeks on off, when she stares at the computer gets dizzy, if she turns head quickly or bends over and gets symptoms. Occasional has blurry vision, had some nauea and emesis yesterday.  Episodes last a few minutes then fade away. Had a migraine las tweek that lasted a day, then resolved . When she closes eyes or rest it improves.  She feels off balance if she tries to walk during episodes    Review Of Systems:  GEN- denies fatigue, fever, weight loss,weakness, recent illness HEENT- denies eye drainage, change in vision, nasal discharge, CVS- denies chest pain, palpitations RESP- denies SOB, cough, wheeze ABD- denies N/V, change in stools, abd pain GU- denies dysuria, hematuria, dribbling, incontinence MSK- denies joint pain, muscle aches, injury Neuro- denies headache, dizziness, syncope, seizure activity       Objective:    BP 122/72   Pulse 74   Temp 98.1 F (36.7 C) (Oral)   Resp 14   Ht 5\' 2"  (1.575 m)   Wt 157 lb (71.2 kg)   SpO2 98%   BMI 28.72 kg/m  GEN- NAD, alert and oriented x3 HEENT- PERRL, EOMI, non injected sclera, pink conjunctiva, MMM, oropharynx clear, TM clear bilat no effusion  Neck- Supple, no thyromegaly, no bruit  CVS- RRR, no murmur RESP-CTAB NEURO-CNII-XII in tact, no deficits, no nystagmusm, normal finger to nose, normal rhomberg,normal gait  EXT- No edema Pulses- Radial2+        Assessment & Plan:      Problem List Items Addressed This Visit    Vertigo    Symptoms consistent with a positional vertigo, givenmeclizine to try, if this does not stop symptoms will looks at imaging brain      Relevant Medications   meclizine (ANTIVERT) 25 MG tablet      Note: This dictation  was prepared with Dragon dictation along with smaller phrase technology. Any transcriptional errors that result from this process are unintentional.

## 2017-06-12 NOTE — Patient Instructions (Signed)
Call me Tuesday if not better F/U as needed

## 2017-06-12 NOTE — Assessment & Plan Note (Signed)
Symptoms consistent with a positional vertigo, givenmeclizine to try, if this does not stop symptoms will looks at imaging brain

## 2017-06-16 ENCOUNTER — Telehealth: Payer: Self-pay | Admitting: *Deleted

## 2017-06-16 MED ORDER — PHENTERMINE HCL 37.5 MG PO CAPS: 37.5000 mg | ORAL_CAPSULE | ORAL | 0 refills | Status: DC

## 2017-06-16 NOTE — Telephone Encounter (Signed)
She can get 1 month- 30 tablets

## 2017-06-16 NOTE — Telephone Encounter (Signed)
Received call from patient.   States that she discussed resuming phentermine for weight with MD during last visit, but was advised to wait until vertigo was resolved.   States that vertigo is much improved. Requested refill.   Ok to refill?

## 2017-06-16 NOTE — Telephone Encounter (Signed)
Call placed to patient. No answer. No VM.  Medication called to pharmacy.

## 2017-07-03 ENCOUNTER — Other Ambulatory Visit: Payer: Self-pay | Admitting: Family Medicine

## 2017-07-08 ENCOUNTER — Other Ambulatory Visit: Payer: Self-pay | Admitting: Family Medicine

## 2017-07-14 ENCOUNTER — Telehealth: Payer: Self-pay | Admitting: *Deleted

## 2017-07-14 NOTE — Telephone Encounter (Signed)
Received documentation from patient requesting referral for breast reduction.   PCP made aware and advised that patient will need OV for referral.

## 2017-07-20 ENCOUNTER — Encounter: Payer: Self-pay | Admitting: Family Medicine

## 2017-07-20 ENCOUNTER — Ambulatory Visit (INDEPENDENT_AMBULATORY_CARE_PROVIDER_SITE_OTHER): Payer: BC Managed Care – PPO | Admitting: Family Medicine

## 2017-07-20 VITALS — BP 112/74 | HR 78 | Temp 98.1°F | Resp 14 | Ht 62.0 in | Wt 161.0 lb

## 2017-07-20 DIAGNOSIS — M546 Pain in thoracic spine: Secondary | ICD-10-CM

## 2017-07-20 DIAGNOSIS — G8929 Other chronic pain: Secondary | ICD-10-CM | POA: Diagnosis not present

## 2017-07-20 DIAGNOSIS — N62 Hypertrophy of breast: Secondary | ICD-10-CM

## 2017-07-20 DIAGNOSIS — M542 Cervicalgia: Secondary | ICD-10-CM | POA: Diagnosis not present

## 2017-07-20 DIAGNOSIS — R21 Rash and other nonspecific skin eruption: Secondary | ICD-10-CM

## 2017-07-20 MED ORDER — PREDNISONE 10 MG PO TABS: ORAL_TABLET | ORAL | 0 refills | Status: DC

## 2017-07-20 MED ORDER — METHYLPREDNISOLONE ACETATE 40 MG/ML IJ SUSP
40.0000 mg | Freq: Once | INTRAMUSCULAR | Status: AC
  Administered 2017-07-20: 40 mg via INTRAMUSCULAR

## 2017-07-20 NOTE — Assessment & Plan Note (Signed)
Chronic back discomfort and shoulder discomfort from the size of her breasts. She does have indentations into the skin. Abdomen refer her to plastic surgery for evaluation. She has had to take anti-inflammatories and Tylenol on and off secondary to the discomfort. She is also try different bras and supportive garments which have not helped.

## 2017-07-20 NOTE — Patient Instructions (Addendum)
Take the steroids  Referral to plastic surgery Give note for work for today  F/U as needed

## 2017-07-20 NOTE — Progress Notes (Signed)
Subjective:    Patient ID: Sandra Parker, female    DOB: May 10, 1976, 41 y.o.   MRN: [id number]  Patient presents for Rash (irritation to arms and upper chest) and Arm Pain (L arm pain- states that last week she had crick in neck and now has pain in arm)  Breast reductio- 38DD, causing pain In both shoulders were her Brontex into the shoulder she also gets neck discomfort and mid back pain from the heaviness. She is that she complained about this which I have noted in her chart isn't back to 2015 she is now ready to proceed with evaluation by plastic surgery.  Neck pain felt like a crook  On right side, for about 2 week, waited about week then started naproxen. Neck improved Then a few days later, left arm started hurting, arm was sore, and rash broke out on neck and and both arms. Has also felt fatiguged. Not sleeping as well,  Very pruritic rash, has used benadryl spray   No recent tick bites     Review Of Systems:  GEN- denies fatigue, fever, weight loss,weakness, recent illness HEENT- denies eye drainage, change in vision, nasal discharge, CVS- denies chest pain, palpitations RESP- denies SOB, cough, wheeze ABD- denies N/V, change in stools, abd pain GU- denies dysuria, hematuria, dribbling, incontinence MSK- +joint pain, muscle aches, injury Neuro- denies headache, dizziness, syncope, seizure activity       Objective:    BP 112/74   Pulse 78   Temp 98.1 F (36.7 C) (Oral)   Resp 14   Ht 5\' 2"  (1.575 m)   Wt 161 lb (73 kg)   SpO2 97%   BMI 29.45 kg/m  GEN- NAD, alert and oriented x3 HEENT- PERRL, EOMI, non injected sclera, pink conjunctiva, MMM, oropharynx clear Neck- Supple, no thyromegaly, no LAD, good ROM spine, C spine nt, Mild TTP paraspinals  CVS- RRR, no murmur RESP-CTAB Skin - indentations in bilat shoulder/bra straps MSK- FROM upper ext, Rotator cuff in tact, biceps in tact Skin- erythematus maculopapular rash scattered bilat upper arms, across chest, no  blisters  Neuro- CNII-XII in tact, no deficits  EXT- No edema Pulses- Radial, DP- 2+        Assessment & Plan:      Problem List Items Addressed This Visit    Back pain   Relevant Medications   predniSONE (DELTASONE) 10 MG tablet   methylPREDNISolone acetate (DEPO-MEDROL) injection 40 mg (Completed)   Other Relevant Orders   Ambulatory referral to Plastic Surgery   Large breasts    Chronic back discomfort and shoulder discomfort from the size of her breasts. She does have indentations into the skin. Abdomen refer her to plastic surgery for evaluation. She has had to take anti-inflammatories and Tylenol on and off secondary to the discomfort. She is also try different bras and supportive garments which have not helped.      Relevant Orders   Ambulatory referral to Plastic Surgery    Other Visit Diagnoses    Rash and nonspecific skin eruption    -  Primary   unclear cause, has taken aleve before does not seem like true drug rash, treat with prednisone, stop Aleve   Relevant Medications   methylPREDNISolone acetate (DEPO-MEDROL) injection 40 mg (Completed)   Neck pain       Acute pain more MSK, no red flags, no injury or sign of pincjed nerve   Relevant Orders   Ambulatory referral to Plastic Surgery  Note: This dictation was prepared with Dragon dictation along with smaller phrase technology. Any transcriptional errors that result from this process are unintentional.

## 2017-07-24 ENCOUNTER — Ambulatory Visit: Payer: BC Managed Care – PPO | Admitting: Family Medicine

## 2017-08-28 ENCOUNTER — Other Ambulatory Visit: Payer: Self-pay | Admitting: Family Medicine

## 2017-09-11 ENCOUNTER — Ambulatory Visit: Payer: BC Managed Care – PPO | Admitting: Family Medicine

## 2017-09-17 ENCOUNTER — Encounter: Payer: Self-pay | Admitting: Family Medicine

## 2017-09-21 ENCOUNTER — Encounter: Payer: Self-pay | Admitting: Family Medicine

## 2017-09-21 ENCOUNTER — Ambulatory Visit (INDEPENDENT_AMBULATORY_CARE_PROVIDER_SITE_OTHER): Payer: BC Managed Care – PPO | Admitting: Family Medicine

## 2017-09-21 ENCOUNTER — Telehealth: Payer: Self-pay | Admitting: Family Medicine

## 2017-09-21 VITALS — BP 106/78 | HR 76 | Temp 98.1°F | Resp 18 | Wt 160.8 lb

## 2017-09-21 DIAGNOSIS — R319 Hematuria, unspecified: Secondary | ICD-10-CM

## 2017-09-21 DIAGNOSIS — N390 Urinary tract infection, site not specified: Secondary | ICD-10-CM | POA: Diagnosis not present

## 2017-09-21 LAB — URINALYSIS, ROUTINE W REFLEX MICROSCOPIC
BILIRUBIN URINE: NEGATIVE
Bacteria, UA: NONE SEEN /HPF
GLUCOSE, UA: NEGATIVE
KETONES UR: NEGATIVE
NITRITE: NEGATIVE
PH: 7 (ref 5.0–8.0)
Protein, ur: NEGATIVE
SPECIFIC GRAVITY, URINE: 1.02 (ref 1.001–1.03)

## 2017-09-21 LAB — CBC
HCT: 38.4 % (ref 35.0–45.0)
HEMOGLOBIN: 13.4 g/dL (ref 11.7–15.5)
MCH: 30 pg (ref 27.0–33.0)
MCHC: 34.9 g/dL (ref 32.0–36.0)
MCV: 85.9 fL (ref 80.0–100.0)
Platelets: 210 10*3/uL (ref 140–400)
RBC: 4.47 10*6/uL (ref 3.80–5.10)
RDW: 12.7 % (ref 11.0–15.0)
WBC: 7.1 10*3/uL (ref 3.8–10.8)

## 2017-09-21 LAB — MICROSCOPIC MESSAGE

## 2017-09-21 MED ORDER — HYDROCODONE-ACETAMINOPHEN 5-325 MG PO TABS: 1.0000 | ORAL_TABLET | Freq: Four times a day (QID) | ORAL | 0 refills | Status: DC | PRN

## 2017-09-21 MED ORDER — SULFAMETHOXAZOLE-TRIMETHOPRIM 800-160 MG PO TABS: 1.0000 | ORAL_TABLET | Freq: Two times a day (BID) | ORAL | 0 refills | Status: DC

## 2017-09-21 NOTE — Progress Notes (Signed)
   Subjective:    Patient ID: Sandra Parker, female    DOB: [birth year], 41 y.o.   MRN: [id number]  Patient presents for Urinary Tract Infection   [place] Started with low back pain mostly on the left side as well as pressure and burning with urination. Then on Sunday this morning she noticed some blood when she wiped. She's had some nausea and low-grade fever. States she's been taking ibuprofen does not help the pain was keeping her up at night in her back. No significant change in her bowel she always has a little looser bowel movement. No vomiting. No URI symptoms recently.    Review Of Systems:  GEN- denies fatigue, fever, weight loss,weakness, recent illness HEENT- denies eye drainage, change in vision, nasal discharge, CVS- denies chest pain, palpitations RESP- denies SOB, cough, wheeze ABD- denies N/V, change in stools, abd pain GU-+ dysuria, +hematuria, dribbling, incontinence MSK- denies joint pain, muscle aches, injury Neuro- denies headache, dizziness, syncope, seizure activity       Objective:    BP 106/78   Pulse 76   Temp 98.1 F (36.7 C) (Oral)   Resp 18   Wt 160 lb 12.8 oz (72.9 kg)   BMI 29.41 kg/m  GEN- NAD, alert and oriented x3 HEENT- PERRL, EOMI, non injected sclera, pink conjunctiva, MMM, oropharynx clear CVS- RRR, no murmur RESP-CTAB ABD-NABS,soft,TTP suprapubic region, Left CVA tenderness, ND Pulses- Radial  2+        Assessment & Plan:      Problem List Items Addressed This Visit    None    Visit Diagnoses    Urinary tract infection with hematuria, site unspecified    -  Primary   Start bactrim, CBC checked no elevated WBC, no fever on exam, was initially concerned for possible early pyelnephritis with the severe back pain. Will start antibiotics, push fluids, given 10 tablets of norco, if not improved needs CT abdomen   Relevant Medications   sulfamethoxazole-trimethoprim (BACTRIM DS,SEPTRA DS) 800-160 MG tablet   Other Relevant Orders   Urinalysis, Routine w reflex microscopic (Completed)   CBC (Completed)   Urine Culture      Note: This dictation was prepared with Dragon dictation along with smaller phrase technology. Any transcriptional errors that result from this process are unintentional.

## 2017-09-21 NOTE — Patient Instructions (Signed)
Take antibiotics Give note for work for today  Take pain medicine as needed F/U as needed  We will call with lab results

## 2017-09-22 ENCOUNTER — Encounter: Payer: Self-pay | Admitting: Family Medicine

## 2017-09-22 ENCOUNTER — Other Ambulatory Visit: Payer: Self-pay | Admitting: *Deleted

## 2017-09-22 ENCOUNTER — Telehealth: Payer: Self-pay | Admitting: Family Medicine

## 2017-09-22 DIAGNOSIS — R109 Unspecified abdominal pain: Secondary | ICD-10-CM

## 2017-09-22 LAB — URINE CULTURE
MICRO NUMBER: 81054061
SPECIMEN QUALITY: ADEQUATE

## 2017-09-22 NOTE — Telephone Encounter (Signed)
CT abdomen/pelvis- abdominal pain, concern for pylonephritis  Put in as URGENT  Okay to extend work note , can return 9/27 tentatively

## 2017-09-22 NOTE — Telephone Encounter (Signed)
To MD

## 2017-09-22 NOTE — Telephone Encounter (Signed)
Call placed to patient and patient made aware.   Note transcribed by front desk.   Orders placed.

## 2017-09-22 NOTE — Telephone Encounter (Signed)
Pt is still having severe back pain, wants to know if we can go ahead and put in order for xray for [facility], also the pain is so bad that she cannot sit at all she has to be laying for any relief. Can we extend her work note? If so please fax work note to [phone] att: angie.

## 2017-09-30 ENCOUNTER — Ambulatory Visit (HOSPITAL_COMMUNITY): Payer: BC Managed Care – PPO

## 2017-10-02 ENCOUNTER — Ambulatory Visit (HOSPITAL_COMMUNITY): Payer: BC Managed Care – PPO

## 2017-10-02 ENCOUNTER — Ambulatory Visit (HOSPITAL_COMMUNITY)
Admission: RE | Admit: 2017-10-02 | Discharge: 2017-10-02 | Disposition: A | Discharge status: Home / Self Care | Payer: BC Managed Care – PPO | Source: Ambulatory Visit | Attending: Family Medicine | Admitting: Family Medicine

## 2017-10-02 DIAGNOSIS — R109 Unspecified abdominal pain: Secondary | ICD-10-CM | POA: Insufficient documentation

## 2017-10-16 ENCOUNTER — Telehealth: Payer: Self-pay | Admitting: Family Medicine

## 2017-10-16 NOTE — Telephone Encounter (Signed)
See note below is it ok to send in rx?  Last OV 9/24

## 2017-10-16 NOTE — Telephone Encounter (Signed)
CB# [phone]  Patient states she has been taken an antibiotic and now needs a prescription for a yeast infection. She uses [facility] in [place].

## 2017-10-16 NOTE — Telephone Encounter (Signed)
Okay to send diflucan  

## 2017-10-19 ENCOUNTER — Ambulatory Visit: Payer: BC Managed Care – PPO | Admitting: Family Medicine

## 2017-10-19 ENCOUNTER — Other Ambulatory Visit: Payer: Self-pay | Admitting: Family Medicine

## 2017-10-19 MED ORDER — FLUCONAZOLE 150 MG PO TABS: 150.0000 mg | ORAL_TABLET | Freq: Once | ORAL | 0 refills | Status: AC

## 2017-10-19 NOTE — Telephone Encounter (Signed)
rx sent to pharmacy.patient aware. 

## 2017-10-19 NOTE — Telephone Encounter (Signed)
Pt called stating that she has a yeast infection from the antibiotic can we call in diflucan to [facility].

## 2017-10-19 NOTE — Telephone Encounter (Signed)
This has been filled 

## 2017-10-27 LAB — HM MAMMOGRAPHY

## 2017-11-05 ENCOUNTER — Telehealth: Payer: Self-pay | Admitting: *Deleted

## 2017-11-05 NOTE — Telephone Encounter (Signed)
Received call from patient. [phone]~ work telephone  States that she has noted increased night sweats while being on Effexor. States that she knows she is having hot flashes, but thinks the Effexor is making it worse.   Requested MD to advise if any other medication would be effective without causing sweats.   MD please advise.

## 2017-11-06 MED ORDER — VENLAFAXINE HCL ER 75 MG PO CP24: ORAL_CAPSULE | ORAL | 1 refills | Status: DC

## 2017-11-06 NOTE — Telephone Encounter (Signed)
Call placed to patient and patient made aware.   States that she has stopped taking hormones, so that might be why she is having increased night sweats. Advised to resume estradiol and call back if S do not improve.

## 2017-11-06 NOTE — Telephone Encounter (Signed)
Effexor is often used to help the night sweats. It also does not cause weight gain , which is one of the reasons I chose this She has been on for quite some time Recommend she make OV. Do not stop abruptly as this will cause worsening anxiety symptoms

## 2017-11-11 ENCOUNTER — Other Ambulatory Visit: Payer: Self-pay | Admitting: Family Medicine

## 2018-01-15 ENCOUNTER — Other Ambulatory Visit: Payer: Self-pay

## 2018-01-15 ENCOUNTER — Encounter: Payer: Self-pay | Admitting: Family Medicine

## 2018-01-15 ENCOUNTER — Ambulatory Visit: Payer: BC Managed Care – PPO | Admitting: Family Medicine

## 2018-01-15 VITALS — BP 124/70 | HR 88 | Temp 98.0°F | Resp 14 | Ht 62.0 in | Wt 163.0 lb

## 2018-01-15 DIAGNOSIS — J0100 Acute maxillary sinusitis, unspecified: Secondary | ICD-10-CM

## 2018-01-15 DIAGNOSIS — E663 Overweight: Secondary | ICD-10-CM | POA: Diagnosis not present

## 2018-01-15 DIAGNOSIS — F339 Major depressive disorder, recurrent, unspecified: Secondary | ICD-10-CM | POA: Diagnosis not present

## 2018-01-15 MED ORDER — AMOXICILLIN-POT CLAVULANATE 875-125 MG PO TABS: 1.0000 | ORAL_TABLET | Freq: Two times a day (BID) | ORAL | 0 refills | Status: DC

## 2018-01-15 MED ORDER — FLUCONAZOLE 150 MG PO TABS: 150.0000 mg | ORAL_TABLET | Freq: Once | ORAL | 0 refills | Status: AC

## 2018-01-15 NOTE — Assessment & Plan Note (Signed)
Doing well on effexor, rare use of xanax, continue current medications

## 2018-01-15 NOTE — Assessment & Plan Note (Signed)
Discussed dietary changes, will plan for fasting labs at [facility] records from GYN as well

## 2018-01-15 NOTE — Progress Notes (Signed)
   Subjective:    Patient ID: Sandra Parker, female    DOB: [birth year], 42 y.o.   MRN: [id number]  Patient presents for Medication Review/ Refill (is not fasting) and Illness (congestion, nasal congestion, sinus pressure)   Sinus pressure and drainage, headache for past week or so. Some cough from drainage. Taking mucinex. No fever   GAD- uses xanax as needed but very rare , doing well effexor   Obesity- joined [employer], she is still eating out a lot, too many carbs    Mammogram done at [place], PAP done as well         Review Of Systems:  GEN- denies fatigue, fever, weight loss,weakness,+ recent illness HEENT- denies eye drainage, change in vision,+ nasal discharge, CVS- denies chest pain, palpitations RESP- denies SOB, cough, wheeze ABD- denies N/V, change in stools, abd pain GU- denies dysuria, hematuria, dribbling, incontinence MSK- denies joint pain, muscle aches, injury Neuro- denies headache, dizziness, syncope, seizure activity       Objective:    BP 124/70   Pulse 88   Temp 98 F (36.7 C) (Oral)   Resp 14   Ht 5\' 2"  (1.575 m)   Wt 163 lb (73.9 kg)   SpO2 98%   BMI 29.81 kg/m  GEN- NAD, alert and oriented x3 HEENT- PERRL, EOMI, non injected sclera, pink conjunctiva, MMM, oropharynx mild injection, TM clear bilat no effusion,  + maxillary sinus tenderness, inflammed turbinates,  Nasal drainage  Neck- Supple, no LAD CVS- RRR, no murmur RESP-CTAB EXT- No edema Pulses- Radial 2+          Assessment & Plan:      Problem List Items Addressed This Visit      Unprioritized   Overweight    Discussed dietary changes, will plan for fasting labs at [facility] Obtain records from GYN as well      Depression    Doing well on effexor, rare use of xanax, continue current medications       Other Visit Diagnoses    Acute non-recurrent maxillary sinusitis    -  Primary   Amox, nasal spray, sudafed, continue mucinex   Relevant  Medications   amoxicillin-clavulanate (AUGMENTIN) 875-125 MG tablet   fluconazole (DIFLUCAN) 150 MG tablet      Note: This dictation was prepared with Dragon dictation along with smaller phrase technology. Any transcriptional errors that result from this process are unintentional.

## 2018-01-15 NOTE — Patient Instructions (Addendum)
Release of records- [facility] need mammogram, labs PAP Smear Take antibiotics, use flonase/nasacort, continue mucinex , add Sufaed  F/U PHYSICAL in 2-3 months

## 2018-01-18 ENCOUNTER — Telehealth: Payer: Self-pay | Admitting: Family Medicine

## 2018-01-18 NOTE — Telephone Encounter (Signed)
Call placed to patient to inquire.   Reports that she switched her soap and now has red irritation to arm. States that this has happened numerous times in the past. Reports that she always treats areas with Jonnie Kind Cream from [employer] (Boric Acid 10%, Zinc Oxide, Eucerin, and Vaseline compound). Reports that irritation is usually gone after 1-2 applications.   Ok to order?

## 2018-01-18 NOTE — Telephone Encounter (Signed)
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 If she has tried okay to send it wont hurt she may not clear it up

## 2018-01-18 NOTE — Telephone Encounter (Signed)
Call placed to patient and patient made aware.  

## 2018-01-18 NOTE — Telephone Encounter (Signed)
[phone] Patient would like to know if "fanny cream" can be called in for a rash she has on her arm  [place] [facility]

## 2018-01-21 ENCOUNTER — Other Ambulatory Visit: Payer: Self-pay

## 2018-01-21 ENCOUNTER — Ambulatory Visit: Payer: BC Managed Care – PPO | Admitting: Physician Assistant

## 2018-01-21 ENCOUNTER — Encounter: Payer: Self-pay | Admitting: Physician Assistant

## 2018-01-21 ENCOUNTER — Encounter: Payer: Self-pay | Admitting: Family Medicine

## 2018-01-21 VITALS — BP 122/74 | HR 93 | Temp 97.9°F | Resp 16 | Wt 166.2 lb

## 2018-01-21 DIAGNOSIS — J988 Other specified respiratory disorders: Secondary | ICD-10-CM

## 2018-01-21 DIAGNOSIS — B9689 Other specified bacterial agents as the cause of diseases classified elsewhere: Secondary | ICD-10-CM

## 2018-01-21 DIAGNOSIS — J019 Acute sinusitis, unspecified: Secondary | ICD-10-CM

## 2018-01-21 MED ORDER — HYDROCODONE-HOMATROPINE 5-1.5 MG/5ML PO SYRP: 5.0000 mL | ORAL_SOLUTION | Freq: Four times a day (QID) | ORAL | 0 refills | Status: DC | PRN

## 2018-01-21 MED ORDER — PREDNISONE 20 MG PO TABS: ORAL_TABLET | ORAL | 0 refills | Status: DC

## 2018-01-21 MED ORDER — CLARITHROMYCIN 250 MG/5ML PO SUSR: ORAL | 0 refills | Status: DC

## 2018-01-21 NOTE — Progress Notes (Signed)
Patient ID: Sandra Parker MRN: [id number], DOB: October 28, 1976, 42 y.o. Date of Encounter: 01/21/2018, 9:42 AM    Chief Complaint:  Chief Complaint  Patient presents with  . Chills    x1week   . Headache  . Cough     HPI: 42 y.o. year old female presents with above.   I reviewed her office note with Dr. Buelah Manis on 01/15/18.  At that visit she reported sinus pressure and drainage, headache.  Some cough secondary to drainage.  At that visit she was prescribed Augmentin and a Diflucan.  Today patient states that the cough has gotten worse.  Says that her ears feel achy, her body aches, and says that she "is coughing so much ".  Says that she feels hot then feels cold off and on.  Says that her head and nose are no better.  "So much pressure ".  "Yesterday I laid my head on my desk because my head was pounding ".  No documented fevers with thermometer.     Home Meds:   Outpatient Medications Prior to Visit  Medication Sig Dispense Refill  . ALPRAZolam (XANAX) 0.5 MG tablet TAKE (1) TABLET BY MOUTH TWICE DAILY AS NEEDED. 60 tablet 1  . amoxicillin-clavulanate (AUGMENTIN) 875-125 MG tablet Take 1 tablet by mouth 2 (two) times daily. 20 tablet 0  . estradiol (ESTRACE) 1 MG tablet TAKE 1 & 1/2 (ONE & ONE-HALF) TABLETS BY MOUTH ONCE DAILY 45 tablet 0  . meclizine (ANTIVERT) 25 MG tablet Take 1 tablet (25 mg total) by mouth 3 (three) times daily as needed for dizziness. 30 tablet 0  . Multiple Vitamin (MULTIVITAMIN) tablet Take 1 tablet by mouth daily. One-a-day    . venlafaxine XR (EFFEXOR-XR) 75 MG 24 hr capsule TAKE 1 CAPSULE BY MOUTH DAILY WITH BREAKFAST. 30 capsule 1   No facility-administered medications prior to visit.     Allergies:  Allergies  Allergen Reactions  . Ciprofloxacin   . Prozac [Fluoxetine] Other (See Comments)    Bad headaches  . Relafen [Nabumetone]   . Wellbutrin [Bupropion]     Bad dreams,  Feel crazy  . Cephalexin Rash      Review of Systems: See HPI for  pertinent ROS. All other ROS negative.    Physical Exam: Blood pressure 122/74, pulse 93, temperature 97.9 F (36.6 C), temperature source Oral, resp. rate 16, weight 75.4 kg (166 lb 3.2 oz), SpO2 99 %., Body mass index is 30.4 kg/m. General:  WF. Appears in no acute distress. HEENT: Normocephalic, atraumatic, eyes without discharge, sclera non-icteric, nares are without discharge. Bilateral auditory canals clear, TM's are without perforation, pearly grey and translucent with reflective cone of light bilaterally. Oral cavity moist, posterior pharynx without exudate, erythema, peritonsillar abscess.  Reports tenderness with percussion to frontal sinuses.  Reports a lot of pressure throughout all sinus regions.  Neck: Supple. No thyromegaly. No lymphadenopathy. Lungs: Clear bilaterally to auscultation without wheezes, rales, or rhonchi. Breathing is unlabored. Heart: Regular rhythm. No murmurs, rubs, or gallops. Msk:  Strength and tone normal for age. Extremities/Skin: Warm and dry.  Neuro: Alert and oriented X 3. Moves all extremities spontaneously. Gait is normal. CNII-XII grossly in tact. Psych:  Responds to questions appropriately with a normal affect.     ASSESSMENT AND PLAN:  42 y.o. year old female with  1. Bacterial respiratory infection She requested liquid for the antibiotic.  I discussed that the prednisone is going to be pills because the liquid [facility] [employer]  has a bad taste.  She states that that is fine for that to be tablets but that the antibiotic is usually so large and right now her throat feels irritated and is difficult to swallow large pills. She has missed no work until today.  Will give note to be out of work today and tomorrow then is off the weekend. She is to take the Biaxin as directed and complete all 10 days.  Also take the prednisone taper as directed.  Can use the Hycodan for cough relief in the interim.  Follow-up if symptoms do not resolve upon completion of antibiotic  and prednisone. - clarithromycin (BIAXIN) 250 MG/5ML suspension; Take 2 teaspoons twice a day for 10 days  Dispense: 200 mL; Refill: 0 - predniSONE (DELTASONE) 20 MG tablet; Take 3 daily for 2 days, then 2 daily for 2 days, then 1 daily for 2 days.  Dispense: 12 tablet; Refill: 0 - HYDROcodone-homatropine (HYCODAN) 5-1.5 MG/5ML syrup; Take 5 mLs by mouth every 6 (six) hours as needed for cough.  Dispense: 120 mL; Refill: 0  2. Acute sinusitis, recurrence not specified, unspecified location - predniSONE (DELTASONE) 20 MG tablet; Take 3 daily for 2 days, then 2 daily for 2 days, then 1 daily for 2 days.  Dispense: 12 tablet; Refill: 0   Signed, [address] [place], [place], [facility] 01/21/2018 9:42 AM

## 2018-01-22 ENCOUNTER — Other Ambulatory Visit: Payer: Self-pay | Admitting: Family Medicine

## 2018-02-04 ENCOUNTER — Telehealth: Payer: Self-pay | Admitting: Family Medicine

## 2018-02-04 NOTE — Telephone Encounter (Signed)
MD please advise

## 2018-02-04 NOTE — Telephone Encounter (Signed)
Pt called stating she still is not any better from her last visit. Wants to know if we can just call in something different to [employer]

## 2018-02-05 ENCOUNTER — Other Ambulatory Visit: Payer: Self-pay

## 2018-02-05 ENCOUNTER — Ambulatory Visit: Payer: BC Managed Care – PPO | Admitting: Family Medicine

## 2018-02-05 ENCOUNTER — Encounter: Payer: Self-pay | Admitting: Family Medicine

## 2018-02-05 VITALS — BP 126/72 | HR 76 | Temp 99.3°F | Resp 16 | Ht 62.0 in | Wt 163.0 lb

## 2018-02-05 DIAGNOSIS — J111 Influenza due to unidentified influenza virus with other respiratory manifestations: Secondary | ICD-10-CM | POA: Diagnosis not present

## 2018-02-05 DIAGNOSIS — B351 Tinea unguium: Secondary | ICD-10-CM

## 2018-02-05 DIAGNOSIS — J029 Acute pharyngitis, unspecified: Secondary | ICD-10-CM | POA: Diagnosis not present

## 2018-02-05 LAB — INFLUENZA A AND B AG, IMMUNOASSAY
INFLUENZA A ANTIGEN: NOT DETECTED
INFLUENZA B ANTIGEN: NOT DETECTED

## 2018-02-05 MED ORDER — OSELTAMIVIR PHOSPHATE 75 MG PO CAPS: 75.0000 mg | ORAL_CAPSULE | Freq: Two times a day (BID) | ORAL | 0 refills | Status: DC

## 2018-02-05 MED ORDER — TERBINAFINE HCL 250 MG PO TABS: 250.0000 mg | ORAL_TABLET | Freq: Every day | ORAL | 1 refills | Status: DC

## 2018-02-05 MED ORDER — HYDROCODONE-HOMATROPINE 5-1.5 MG/5ML PO SYRP: 5.0000 mL | ORAL_SOLUTION | Freq: Four times a day (QID) | ORAL | 0 refills | Status: DC | PRN

## 2018-02-05 NOTE — Telephone Encounter (Signed)
Pt has OV 

## 2018-02-05 NOTE — Patient Instructions (Addendum)
Take the tamiflu  Salt water gargle Take the cough medication  Immune health- Vitamin C /zinc  F/U as needed

## 2018-02-05 NOTE — Progress Notes (Signed)
   Subjective:    Patient ID: Sandra Parker, female    DOB: 19-May-1976, 42 y.o.   MRN: [id number]  Patient presents for Illness (fever, sore throat, body aches, positive exposure to flu)  Pt here with ongoing illness, treated for sinusitis on 1/18 with amoxicillin, she came back on 1/24 with progressive symptoms and more chest cold, given Clarithromycin, Hycodan and prednisone.  Symptoms improved some, but then Sunday started with sore throat, body aches, headache, had a little diarrhea. Wed had fever to  101F. Took tylenol, Mucinex DM as well. She has been exposed to flu at her work   She also has yellowing of her nails both hands, went to new nail salon states it keeps spreading   Review Of Systems:  GEN- + fatigue, +fever, weight loss,weakness, recent illness HEENT- denies eye drainage, change in vision, nasal discharge, CVS- denies chest pain, palpitations RESP- denies SOB, +cough, wheeze ABD- denies N/V, change in stools, abd pain GU- denies dysuria, hematuria, dribbling, incontinence MSK- denies joint pain,+ muscle aches, injury Neuro- denies headache, dizziness, syncope, seizure activity       Objective:    BP 126/72   Pulse 76   Temp 99.3 F (37.4 C) (Oral)   Resp 16   Ht 5\' 2"  (1.575 m)   Wt 163 lb (73.9 kg)   SpO2 99%   BMI 29.81 kg/m  .  GEN- NAD, alert and oriented x3,sick appearing  HEENT- PERRL, EOMI, non injected sclera, pink conjunctiva, MMM, oropharynx mild injection enlarged tonsils, no exudate , TM clear bilat no effusion,  Mild  maxillary sinus tenderness,+ clear   Nasal drainage  Neck- Supple, + LAD CVS- RRR, no murmur RESP-CTAB EXT- No edema Pulses- Radial 2+        Assessment & Plan:      Problem List Items Addressed This Visit    None    Visit Diagnoses    Influenza    -  Primary   Based on history, contacts, will treat for FLU, given tamiflu, strep was negative, salt water gargle, magic mouthwash on back order, ibuprofen for fever,  cough medicine refilled    Relevant Medications   terbinafine (LAMISIL) 250 MG tablet   oseltamivir (TAMIFLU) 75 MG capsule   Other Relevant Orders   Influenza A and B Ag, Immunoassay (Completed)   Pharyngitis, unspecified etiology       Relevant Orders   STREP GROUP A AG, W/REFLEX TO CULT (Completed)   Onychomycosis       Treat with terbinafine after illness resolves , once a day for 6 weeks,check LFT IN 3 weeks   Relevant Medications   terbinafine (LAMISIL) 250 MG tablet   oseltamivir (TAMIFLU) 75 MG capsule   Other Relevant Orders   COMPLETE METABOLIC PANEL WITH GFR      Note: This dictation was prepared with Dragon dictation along with smaller phrase technology. Any transcriptional errors that result from this process are unintentional.

## 2018-02-07 LAB — CULTURE, GROUP A STREP
MICRO NUMBER: 90172508
SPECIMEN QUALITY:: ADEQUATE

## 2018-02-07 LAB — STREP GROUP A AG, W/REFLEX TO CULT: Streptococcus, Group A Screen (Direct): NOT DETECTED

## 2018-02-08 ENCOUNTER — Encounter: Payer: Self-pay | Admitting: Family Medicine

## 2018-02-17 ENCOUNTER — Other Ambulatory Visit: Payer: Self-pay | Admitting: Family Medicine

## 2018-02-17 ENCOUNTER — Telehealth: Payer: Self-pay | Admitting: *Deleted

## 2018-02-17 NOTE — Telephone Encounter (Signed)
-----   Message from Alycia Rossetti, MD sent at 02/15/2018  1:05 PM EST ----- Regarding: FW: Needs LFT for terbinafine in 2 weeks  Pt should have started the terbinafine, advise her she needs to come get her liver checked, this is baseline for this medication  CMET  ----- Message ----- From: Alycia Rossetti, MD Sent: 02/15/2018 To: Alycia Rossetti, MD Subject: Needs LFT for terbinafine in 2 weeks

## 2018-02-17 NOTE — Telephone Encounter (Signed)
Call placed to patient. LMTRC.  

## 2018-02-18 ENCOUNTER — Other Ambulatory Visit: Payer: Self-pay | Admitting: *Deleted

## 2018-02-18 DIAGNOSIS — Z79899 Other long term (current) drug therapy: Secondary | ICD-10-CM

## 2018-02-18 NOTE — Telephone Encounter (Signed)
Call placed to patient and patient made aware.   Patient states that she did not begin medication until she completed all ABTx, so she has only been on x1 week.   States that she will come in next week for labs.

## 2018-02-22 ENCOUNTER — Other Ambulatory Visit: Payer: BC Managed Care – PPO

## 2018-02-22 ENCOUNTER — Encounter: Payer: Self-pay | Admitting: Family Medicine

## 2018-02-22 DIAGNOSIS — B351 Tinea unguium: Secondary | ICD-10-CM

## 2018-02-22 LAB — COMPLETE METABOLIC PANEL WITH GFR
AG Ratio: 1.6 (calc) (ref 1.0–2.5)
ALBUMIN MSPROF: 4.3 g/dL (ref 3.6–5.1)
ALT: 23 U/L (ref 6–29)
AST: 18 U/L (ref 10–30)
Alkaline phosphatase (APISO): 69 U/L (ref 33–115)
BUN: 14 mg/dL (ref 7–25)
CHLORIDE: 107 mmol/L (ref 98–110)
CO2: 25 mmol/L (ref 20–32)
Calcium: 9.3 mg/dL (ref 8.6–10.2)
Creat: 0.85 mg/dL (ref 0.50–1.10)
GFR, EST AFRICAN AMERICAN: 98 mL/min/{1.73_m2} (ref >=60)
GFR, EST NON AFRICAN AMERICAN: 85 mL/min/{1.73_m2} (ref >=60)
GLUCOSE: 95 mg/dL (ref 65–99)
Globulin: 2.7 g/dL (calc) (ref 1.9–3.7)
Potassium: 4.5 mmol/L (ref 3.5–5.3)
Sodium: 141 mmol/L (ref 135–146)
TOTAL PROTEIN: 7 g/dL (ref 6.1–8.1)
Total Bilirubin: 0.3 mg/dL (ref 0.2–1.2)

## 2018-02-22 LAB — EXTRA LAV TOP TUBE

## 2018-02-25 ENCOUNTER — Encounter: Payer: Self-pay | Admitting: *Deleted

## 2018-04-07 ENCOUNTER — Encounter: Payer: BC Managed Care – PPO | Admitting: Family Medicine

## 2018-04-30 ENCOUNTER — Other Ambulatory Visit: Payer: Self-pay

## 2018-04-30 ENCOUNTER — Encounter: Payer: Self-pay | Admitting: Family Medicine

## 2018-04-30 ENCOUNTER — Ambulatory Visit (INDEPENDENT_AMBULATORY_CARE_PROVIDER_SITE_OTHER): Payer: BC Managed Care – PPO | Admitting: Family Medicine

## 2018-04-30 VITALS — BP 122/62 | HR 84 | Temp 98.4°F | Resp 14 | Ht 62.0 in | Wt 167.0 lb

## 2018-04-30 DIAGNOSIS — F419 Anxiety disorder, unspecified: Secondary | ICD-10-CM | POA: Diagnosis not present

## 2018-04-30 MED ORDER — ALPRAZOLAM 0.5 MG PO TABS: ORAL_TABLET | ORAL | 1 refills | Status: DC

## 2018-04-30 MED ORDER — VENLAFAXINE HCL ER 37.5 MG PO CP24: ORAL_CAPSULE | ORAL | 3 refills | Status: DC

## 2018-04-30 NOTE — Progress Notes (Signed)
   Subjective:    Patient ID: Sandra Parker, female    DOB: 20-Jun-1976, 42 y.o.   MRN: [id number]  Patient presents for Medication Management  Pt here to discusses her mood, states she "feels like she is running" all the time and cant calm down. She is figdity feels like she doesn't have enough work to keep her busy. She does not sleep well at night, worried about family issues with her son and his new wife. She doesn't feel depressed but on edge, her coworkers and family have mentioned she makes them feel ansy/nervous the way she acts. Our last visit in Feb, she was doing well with Effexor, but she stopped it because she had dry mouth She uses xanax sparingly No excessive spending, denies feeling Impulsive   Review Of Systems:  GEN- denies fatigue, fever, weight loss,weakness, recent illness HEENT- denies eye drainage, change in vision, nasal discharge, CVS- denies chest pain, palpitations RESP- denies SOB, cough, wheeze ABD- denies N/V, change in stools, abd pain GU- denies dysuria, hematuria, dribbling, incontinence MSK- denies joint pain, muscle aches, injury Neuro- denies headache, dizziness, syncope, seizure activity       Objective:    BP 122/62   Pulse 84   Temp 98.4 F (36.9 C) (Oral)   Resp 14   Ht 5\' 2"  (1.575 m)   Wt 167 lb (75.8 kg)   SpO2 99%   BMI 30.54 kg/m  GEN- NAD, alert and oriented x3 Psych- normal affect and mood, normal thought process, not depressed appearing, normal speech       Assessment & Plan:      Problem List Items Addressed This Visit      Unprioritized   Anxiety - Primary    She has longstanding history of anxiety Discussed her medication options She has been on multiple medications She prefers to go back on effexor, also did well on zoloft in past Start 37.5mg  Daily and see if she recurrent SE  Will monitor for signs of bipolar       Relevant Medications   venlafaxine XR (EFFEXOR-XR) 37.5 MG 24 hr capsule   ALPRAZolam (XANAX)  0.5 MG tablet      Note: This dictation was prepared with Dragon dictation along with smaller phrase technology. Any transcriptional errors that result from this process are unintentional.

## 2018-04-30 NOTE — Patient Instructions (Addendum)
Start effexor 37.5mg  once a day  Referral to therapist  F/U 4 weeks- Friday after 2pm

## 2018-05-02 ENCOUNTER — Encounter: Payer: Self-pay | Admitting: Family Medicine

## 2018-05-02 NOTE — Assessment & Plan Note (Addendum)
She has longstanding history of anxiety Discussed her medication options She has been on multiple medications She prefers to go back on effexor, also did well on zoloft in past Start 37.5mg  Daily and see if she recurrent SE  Will monitor for signs of bipolar

## 2018-05-19 ENCOUNTER — Telehealth: Payer: Self-pay | Admitting: *Deleted

## 2018-05-19 NOTE — Telephone Encounter (Signed)
Received call from patient.   Reports that she was started on Effexor at last OV on 04/30/2018 and was advised to return to F/U in 4 weeks. States that she has appointment scheduled on 05/28/2018, but she will be going out of town at that time.   Reports that she is doing well on medication. Reports decreased feelings of jitteriness and anxiety.

## 2018-05-19 NOTE — Telephone Encounter (Signed)
Okay to stay on the same dose 37.5mg , no changes Can reschedule for June/July

## 2018-05-20 NOTE — Telephone Encounter (Signed)
Call placed to patient and patient made aware.  

## 2018-05-27 ENCOUNTER — Encounter: Payer: Self-pay | Admitting: Physician Assistant

## 2018-05-27 ENCOUNTER — Encounter: Payer: Self-pay | Admitting: Family Medicine

## 2018-05-27 ENCOUNTER — Other Ambulatory Visit: Payer: Self-pay

## 2018-05-27 ENCOUNTER — Ambulatory Visit: Payer: BC Managed Care – PPO | Admitting: Physician Assistant

## 2018-05-27 VITALS — BP 120/70 | HR 80 | Temp 97.6°F | Resp 16 | Ht 61.0 in | Wt 164.6 lb

## 2018-05-27 DIAGNOSIS — L239 Allergic contact dermatitis, unspecified cause: Secondary | ICD-10-CM

## 2018-05-27 NOTE — Progress Notes (Signed)
Patient ID: LYNLEY KILLILEA MRN: [id number], DOB: Mar 30, 1976, 42 y.o. Date of Encounter: 05/27/2018, 2:56 PM    Chief Complaint:  Chief Complaint  Patient presents with  . left arm itching    feels like a knot not sure if she got bit by insect      HPI: 42 y.o. year old female presents with above.   States that she thinks she was bit by some type of an insect on Friday.  Did not feel a bite or sting and did not see any type of insect bee or spider.  States that she started noticing that area of her left upper arm felt itchy on Friday night.  Has been applying Benadryl cream to the site.  States that the itching and the firm area have decreased a lot but still sore.  Also had felt a tingly sensation some intermittently so he was concerned and thought she should get this evaluated.  No other concerns to address today.     Home Meds:   Outpatient Medications Prior to Visit  Medication Sig Dispense Refill  . ALPRAZolam (XANAX) 0.5 MG tablet TAKE (1) TABLET BY MOUTH TWICE DAILY AS NEEDED. 60 tablet 1  . estradiol (ESTRACE) 1 MG tablet TAKE 1 & 1/2 (ONE & ONE-HALF) TABLETS BY MOUTH ONCE DAILY 45 tablet 0  . meclizine (ANTIVERT) 25 MG tablet Take 1 tablet (25 mg total) by mouth 3 (three) times daily as needed for dizziness. 30 tablet 0  . Multiple Vitamin (MULTIVITAMIN) tablet Take 1 tablet by mouth daily. One-a-day    . venlafaxine XR (EFFEXOR-XR) 37.5 MG 24 hr capsule Take 1 tablet daily 30 capsule 3   No facility-administered medications prior to visit.     Allergies:  Allergies  Allergen Reactions  . Ciprofloxacin   . Prozac [Fluoxetine] Other (See Comments)    Bad headaches  . Relafen [Nabumetone]   . Wellbutrin [Bupropion]     Bad dreams,  Feel crazy  . Cephalexin Rash      Review of Systems: See HPI for pertinent ROS. All other ROS negative.    Physical Exam: Blood pressure 120/70, pulse 80, temperature 97.6 F (36.4 C), temperature source Oral, resp. rate 16,  height 5\' 1"  (1.549 m), weight 74.7 kg (164 lb 9.6 oz), SpO2 98 %., Body mass index is 31.1 kg/m. General: WF.  Appears in no acute distress. Neck: Supple. No thyromegaly. No lymphadenopathy. Lungs: Clear bilaterally to auscultation without wheezes, rales, or rhonchi. Breathing is unlabored. Heart: Regular rhythm. No murmurs, rubs, or gallops. Msk:  Strength and tone normal for age. Extremities/Skin: Left upper arm-- lateral aspect---there is 76mm dot of erythema--c/w site of bite or sting. Otherwise--inspection is normal.  There is no surrounding erythema.  There is no necrosis.  I palpated the area.  There is very minimal firmness and this only measures about 0.5 cm.  No warmth of the area.  Remainder of exam of the area is normal. Neuro: Alert and oriented X 3. Moves all extremities spontaneously. Gait is normal. CNII-XII grossly in tact. Psych:  Responds to questions appropriately with a normal affect.     ASSESSMENT AND PLAN:  42 y.o. year old female with  1. Allergic dermatitis I discussed with her that I think she had some type of bite or sting that caused a localized allergic reaction.  Discussed that when this happens there is some inflammation and swelling to the area that causes the firmness and compression on nerves that  probably caused the intermittent tingly sensation.  Viewed that I do not think that this is anything serious or reaction to any toxic substance.  Not consistent with black widow spider.  Not consistent with brown recluse spider.  Recommend that she take some oral Benadryl until symptoms subside.  Follow-up if needed.   Marin Olp [place], [place], [facility] 05/27/2018 2:56 PM

## 2018-05-28 ENCOUNTER — Ambulatory Visit: Payer: BC Managed Care – PPO | Admitting: Family Medicine

## 2018-07-12 ENCOUNTER — Encounter: Payer: Self-pay | Admitting: Family Medicine

## 2018-07-12 ENCOUNTER — Other Ambulatory Visit: Payer: Self-pay

## 2018-07-12 ENCOUNTER — Ambulatory Visit: Payer: BC Managed Care – PPO | Admitting: Family Medicine

## 2018-07-12 VITALS — Temp 98.4°F | Resp 14 | Ht 61.0 in | Wt 164.0 lb

## 2018-07-12 DIAGNOSIS — R42 Dizziness and giddiness: Secondary | ICD-10-CM | POA: Diagnosis not present

## 2018-07-12 MED ORDER — MECLIZINE HCL 25 MG PO TABS: 25.0000 mg | ORAL_TABLET | Freq: Three times a day (TID) | ORAL | 1 refills | Status: DC | PRN

## 2018-07-12 NOTE — Assessment & Plan Note (Signed)
No red flags, will give refill on meclizine BP normal, no recent illness If she gets recurrent symptoms, will send to ENT

## 2018-07-12 NOTE — Progress Notes (Signed)
Patient ID: Sandra Parker, female   DOB: 22-Jul-1976, 42 y.o.   MRN: [id number]  Orthostatic Blood Pressure:  Lying: 102/68  Sitting: 100/64  Standing: 102/64

## 2018-07-12 NOTE — Patient Instructions (Addendum)
Call if this reoccurs You will be referred to ENT F/U as needed

## 2018-07-12 NOTE — Progress Notes (Signed)
   Subjective:    Patient ID: Sandra Parker, female    DOB: [birth year], 42 y.o.   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 starting getting dizzy        Took Meclizine a year ago for vertigo, she ran out reently so did not have for these spells        Has not any recent illness, drinking lots of fluids               Review Of Systems:  GEN- denies fatigue, fever, weight loss,weakness, recent illness HEENT- denies eye drainage, change in vision, nasal discharge, CVS- denies chest pain, palpitations RESP- denies SOB, cough, wheeze ABD- denies N/V, change in stools, abd pain GU- denies dysuria, hematuria, dribbling, incontinence MSK- denies joint pain, muscle aches, injury Neuro- denies headache,+ dizziness, syncope, seizure activity       Objective:    Temp 98.4 F (36.9 C) (Oral)   Resp 14   Ht 5\' 1"  (1.549 m)   Wt 164 lb (74.4 kg)   SpO2 96%   BMI 30.99 kg/m  GEN- NAD, alert and oriented x3 HEENT- PERRL, EOMI, non injected sclera, pink conjunctiva, MMM, oropharynx clear Neck- Supple, no bruit, good ROM CVS- RRR, no murmur RESP-CTAB Neuro-CNII-XII in tact, no focal deficits, no nystagmus, normal gait  EXT- No edema Pulses- Radial  2+        Assessment & Plan:      Problem List Items Addressed This Visit      Unprioritized   Vertigo - Primary    No red flags, will give refill on meclizine BP normal, no recent illness If she gets recurrent symptoms, will send to ENT      Relevant Medications   meclizine (ANTIVERT) 25 MG tablet      Note: This dictation was  prepared with Dragon dictation along with smaller phrase technology. Any transcriptional errors that result from this process are unintentional.

## 2018-07-20 ENCOUNTER — Telehealth: Payer: Self-pay | Admitting: Family Medicine

## 2018-07-20 NOTE — Telephone Encounter (Signed)
Patient has not been on Hydrocodone since 2018. She will need an OV to refill.   Patient has not been seen to document any issues with her in regards to breast reduction. Patient will need OV to discuss.

## 2018-07-20 NOTE — Telephone Encounter (Signed)
Patient would like refill on her hydrocodone to be sent to [facility]  Also would like a recommendation letter to be sent to her surgeon dr Georgia Lopes  To say why we would recommend this breast reduction for her  Would like for you to call her with specifics of what the letter should say    [phone]

## 2018-07-30 ENCOUNTER — Ambulatory Visit: Payer: BC Managed Care – PPO | Admitting: Family Medicine

## 2018-08-02 ENCOUNTER — Other Ambulatory Visit: Payer: Self-pay

## 2018-08-02 ENCOUNTER — Ambulatory Visit (INDEPENDENT_AMBULATORY_CARE_PROVIDER_SITE_OTHER): Payer: BC Managed Care – PPO | Admitting: Family Medicine

## 2018-08-02 ENCOUNTER — Encounter: Payer: Self-pay | Admitting: Family Medicine

## 2018-08-02 VITALS — BP 124/64 | HR 68 | Temp 98.1°F | Resp 12 | Ht 61.0 in | Wt 168.0 lb

## 2018-08-02 DIAGNOSIS — N62 Hypertrophy of breast: Secondary | ICD-10-CM

## 2018-08-02 DIAGNOSIS — M549 Dorsalgia, unspecified: Secondary | ICD-10-CM

## 2018-08-02 MED ORDER — HYDROCODONE-ACETAMINOPHEN 5-325 MG PO TABS: 1.0000 | ORAL_TABLET | Freq: Four times a day (QID) | ORAL | 0 refills | Status: DC | PRN

## 2018-08-02 NOTE — Assessment & Plan Note (Signed)
We will send letter recommendation for breast surgery reduction.  She is tried multiple conservative efforts.  I think that she will benefit and improve quality of life with smaller breast size.  She has anti-inflammatory she has rare use of hydrocodone but I did refill today last refilled a year ago.

## 2018-08-02 NOTE — Patient Instructions (Addendum)
F/U as needed Letter for surgery to be done

## 2018-08-02 NOTE — Progress Notes (Signed)
   Subjective:    Patient ID: Sandra Parker, female    DOB: Feb 21, 1976, 42 y.o.   MRN: [id number]  Patient presents for Medication Refill (would like refill on Hydrcodone/ APAP) and Letter of Recommendation (Dr. Leonidas Romberg- breast reduction)   Needs letter of recommendation for breast reduction.  Is seen at the time secondary to back pain with her large breast size.  She gets shoulder pain on the top secondary to the indentation from the bra strap she is currently a 38 double D.  She is tried pain medications he is also tried exercises for her back and neck she was seen by plastic surgeon last year who also recommended these prior to surgery.  She is tried different undergarments for support.  She is also had to be treated for intertrigo beneath the breasts secondary to moisture/yeast. She would like to proceed with breast reduction  Review Of Systems:  GEN- denies fatigue, fever, weight loss,weakness, recent illness HEENT- denies eye drainage, change in vision, nasal discharge, CVS- denies chest pain, palpitations RESP- denies SOB, cough, wheeze ABD- denies N/V, change in stools, abd pain GU- denies dysuria, hematuria, dribbling, incontinence MSK- + joint pain, muscle aches, injury Neuro- denies headache, dizziness, syncope, seizure activity       Objective:    BP 124/64   Pulse 68   Temp 98.1 F (36.7 C) (Oral)   Resp 12   Ht 5\' 1"  (1.549 m)   Wt 168 lb (76.2 kg)   SpO2 97%   BMI 31.74 kg/m  GEN- NAD, alert and oriented x3 HEENT- PERRL, EOMI, non injected sclera, pink conjunctiva, MMM, oropharynx clear Neck- Supple, FROM CVS- RRR, no murmur RESP-CTAB MSK- good ROM spine, mild TTP paraspinals, identations bilat shoulders, FROM shoulder/UE  EXT- No edema Pulses- Radial 2+        Assessment & Plan:      Problem List Items Addressed This Visit      Unprioritized   Large breasts - Primary    We will send letter recommendation for breast surgery reduction.  She is tried  multiple conservative efforts.  I think that she will benefit and improve quality of life with smaller breast size.  She has anti-inflammatory she has rare use of hydrocodone but I did refill today last refilled a year ago.      Mid back pain   Relevant Medications   HYDROcodone-acetaminophen (NORCO) 5-325 MG tablet      Note: This dictation was prepared with Dragon dictation along with smaller phrase technology. Any transcriptional errors that result from this process are unintentional.

## 2018-08-03 NOTE — Addendum Note (Signed)
Addended by: Sheral Flow on: 08/03/2018 10:34 AM   Modules accepted: Orders

## 2018-08-26 ENCOUNTER — Ambulatory Visit: Payer: BC Managed Care – PPO | Admitting: Family Medicine

## 2018-09-03 ENCOUNTER — Ambulatory Visit: Payer: BC Managed Care – PPO | Admitting: Family Medicine

## 2018-09-03 ENCOUNTER — Encounter: Payer: Self-pay | Admitting: Family Medicine

## 2018-09-03 VITALS — BP 128/82 | HR 86 | Temp 98.1°F | Resp 16 | Ht 61.0 in | Wt 168.0 lb

## 2018-09-03 DIAGNOSIS — K219 Gastro-esophageal reflux disease without esophagitis: Secondary | ICD-10-CM | POA: Diagnosis not present

## 2018-09-03 NOTE — Progress Notes (Signed)
Subjective:    Patient ID: Sandra Parker, female    DOB: August 26, 1976, 42 y.o.   MRN: [id number]  HPI Patient is a 42 year old Caucasian female who presents today with several weeks of indigestion.  She also reports epigastric bloating.  She wakes up frequently at night with a burning sensation in her chest.  She also reports an acid- like liquid sometimes in the back of her throat when she is supine.  She reports heartburn regularly.  She denies any melena, hematochezia, nausea, vomiting, hematemesis. Past Medical History:  Diagnosis Date  . Anxiety   . Atrophic vaginitis   . Constipation   . Depression    Past Surgical History:  Procedure Laterality Date  . ABDOMINAL HYSTERECTOMY     Current Outpatient Medications on File Prior to Visit  Medication Sig Dispense Refill  . ALPRAZolam (XANAX) 0.5 MG tablet TAKE (1) TABLET BY MOUTH TWICE DAILY AS NEEDED. 60 tablet 1  . estradiol (ESTRACE) 1 MG tablet TAKE 1 & 1/2 (ONE & ONE-HALF) TABLETS BY MOUTH ONCE DAILY 45 tablet 0  . HYDROcodone-acetaminophen (NORCO) 5-325 MG tablet Take 1 tablet by mouth every 6 (six) hours as needed for moderate pain. 30 tablet 0  . meclizine (ANTIVERT) 25 MG tablet Take 1 tablet (25 mg total) by mouth 3 (three) times daily as needed for dizziness. 30 tablet 1  . Multiple Vitamin (MULTIVITAMIN) tablet Take 1 tablet by mouth daily. One-a-day    . venlafaxine XR (EFFEXOR-XR) 37.5 MG 24 hr capsule Take 1 tablet daily 30 capsule 3   No current facility-administered medications on file prior to visit.    Allergies  Allergen Reactions  . Ciprofloxacin   . Prozac [Fluoxetine] Other (See Comments)    Bad headaches  . Relafen [Nabumetone]   . Wellbutrin [Bupropion]     Bad dreams,  Feel crazy  . Cephalexin Rash   Social History   Socioeconomic History  . Marital status: Divorced    Spouse name: Not on file  . Number of children: Not on file  . Years of education: Not on file  . Highest education level: Not on  file  Occupational History  . Not on file  Social Needs  . Financial resource strain: Not on file  . Food insecurity:    Worry: Not on file    Inability: Not on file  . Transportation needs:    Medical: Not on file    Non-medical: Not on file  Tobacco Use  . Smoking status: Never Smoker  . Smokeless tobacco: Never Used  Substance and Sexual Activity  . Alcohol use: Yes  . Drug use: No  . Sexual activity: Not on file  Lifestyle  . Physical activity:    Days per week: Not on file    Minutes per session: Not on file  . Stress: Not on file  Relationships  . Social connections:    Talks on phone: Not on file    Gets together: Not on file    Attends religious service: Not on file    Active member of club or organization: Not on file    Attends meetings of clubs or organizations: Not on file    Relationship status: Not on file  . Intimate partner violence:    Fear of current or ex partner: Not on file    Emotionally abused: Not on file    Physically abused: Not on file    Forced sexual activity: Not on file  Other Topics  Concern  . Not on file  Social History Narrative  . Not on file      Review of Systems  All other systems reviewed and are negative.      Objective:   Physical Exam  Cardiovascular: Normal rate, regular rhythm and normal heart sounds. Exam reveals no gallop and no friction rub.  No murmur heard. Pulmonary/Chest: Effort normal and breath sounds normal. No stridor. No respiratory distress. She has no wheezes. She has no rales.  Abdominal: Soft. Bowel sounds are normal. She exhibits no distension and no mass. There is no tenderness. There is no rebound and no guarding.  Vitals reviewed.         Assessment & Plan:  Gastroesophageal reflux disease without esophagitis  I believe the patient has GERD.  I will try her on Dexilant 60 mg a day with food and reassess the patient in 10 days.  If symptoms are improving, I would switch her to Prilosec to  complete a 6-week course.  Reassess in 2 weeks if no better or sooner if worse

## 2018-09-21 ENCOUNTER — Telehealth: Payer: Self-pay | Admitting: Family Medicine

## 2018-09-21 NOTE — Telephone Encounter (Signed)
Patient is calling to get a prescription for dexilant the samples worked well, also would like to get a prescription for phentrimine if any way possible, so she can lose her extra weight before surgery and not need any type of lyposuction.  [place]

## 2018-09-22 MED ORDER — DEXLANSOPRAZOLE 60 MG PO CPDR: 60.0000 mg | DELAYED_RELEASE_CAPSULE | Freq: Every day | ORAL | 3 refills | Status: DC

## 2018-09-22 NOTE — Telephone Encounter (Signed)
OK with dexilant 60 mg a day.  I would not call out phentermine.

## 2018-09-22 NOTE — Telephone Encounter (Signed)
Medication called/sent to requested pharmacy and pt aware of no phentermine via vm.

## 2018-09-24 ENCOUNTER — Other Ambulatory Visit: Payer: Self-pay | Admitting: Obstetrics and Gynecology

## 2018-09-24 DIAGNOSIS — Z1231 Encounter for screening mammogram for malignant neoplasm of breast: Secondary | ICD-10-CM

## 2018-10-12 ENCOUNTER — Telehealth: Payer: Self-pay | Admitting: Family Medicine

## 2018-10-22 ENCOUNTER — Other Ambulatory Visit: Payer: Self-pay | Admitting: Family Medicine

## 2018-10-22 MED ORDER — PANTOPRAZOLE SODIUM 40 MG PO TBEC: 40.0000 mg | DELAYED_RELEASE_TABLET | Freq: Every day | ORAL | 3 refills | Status: DC

## 2018-10-25 ENCOUNTER — Ambulatory Visit
Admission: RE | Admit: 2018-10-25 | Discharge: 2018-10-25 | Disposition: A | Discharge status: Home / Self Care | Payer: BC Managed Care – PPO | Source: Ambulatory Visit | Attending: Obstetrics and Gynecology | Admitting: Obstetrics and Gynecology

## 2018-10-25 DIAGNOSIS — Z1231 Encounter for screening mammogram for malignant neoplasm of breast: Secondary | ICD-10-CM

## 2018-10-27 ENCOUNTER — Other Ambulatory Visit: Payer: Self-pay | Admitting: Obstetrics and Gynecology

## 2018-10-28 ENCOUNTER — Ambulatory Visit: Payer: BC Managed Care – PPO

## 2018-10-29 ENCOUNTER — Ambulatory Visit
Admission: RE | Admit: 2018-10-29 | Discharge: 2018-10-29 | Disposition: A | Discharge status: Home / Self Care | Payer: BC Managed Care – PPO | Source: Ambulatory Visit | Attending: Obstetrics and Gynecology | Admitting: Obstetrics and Gynecology

## 2018-10-29 DIAGNOSIS — N62 Hypertrophy of breast: Secondary | ICD-10-CM

## 2018-10-29 HISTORY — DX: Hypertrophy of breast: N62

## 2018-11-15 ENCOUNTER — Other Ambulatory Visit: Payer: Self-pay

## 2018-11-15 ENCOUNTER — Encounter (HOSPITAL_BASED_OUTPATIENT_CLINIC_OR_DEPARTMENT_OTHER): Payer: Self-pay | Admitting: *Deleted

## 2018-11-17 ENCOUNTER — Encounter: Payer: BC Managed Care – PPO | Admitting: Family Medicine

## 2018-11-22 ENCOUNTER — Other Ambulatory Visit: Payer: Self-pay

## 2018-11-22 ENCOUNTER — Encounter (HOSPITAL_BASED_OUTPATIENT_CLINIC_OR_DEPARTMENT_OTHER): Payer: Self-pay | Admitting: *Deleted

## 2018-11-22 ENCOUNTER — Ambulatory Visit (HOSPITAL_BASED_OUTPATIENT_CLINIC_OR_DEPARTMENT_OTHER)
Admission: RE | Admit: 2018-11-22 | Discharge: 2018-11-22 | Disposition: A | Discharge status: Home / Self Care | Payer: BC Managed Care – PPO | Source: Ambulatory Visit | Attending: Specialist | Admitting: Specialist

## 2018-11-22 ENCOUNTER — Ambulatory Visit (HOSPITAL_BASED_OUTPATIENT_CLINIC_OR_DEPARTMENT_OTHER): Payer: BC Managed Care – PPO | Admitting: Certified Registered"

## 2018-11-22 ENCOUNTER — Encounter (HOSPITAL_BASED_OUTPATIENT_CLINIC_OR_DEPARTMENT_OTHER)
Admission: RE | Disposition: A | Discharge status: Home / Self Care | Payer: Self-pay | Source: Ambulatory Visit | Attending: Specialist

## 2018-11-22 DIAGNOSIS — M26609 Unspecified temporomandibular joint disorder, unspecified side: Secondary | ICD-10-CM | POA: Insufficient documentation

## 2018-11-22 DIAGNOSIS — Z888 Allergy status to other drugs, medicaments and biological substances status: Secondary | ICD-10-CM | POA: Insufficient documentation

## 2018-11-22 DIAGNOSIS — Z90710 Acquired absence of both cervix and uterus: Secondary | ICD-10-CM | POA: Diagnosis not present

## 2018-11-22 DIAGNOSIS — M549 Dorsalgia, unspecified: Secondary | ICD-10-CM | POA: Diagnosis not present

## 2018-11-22 DIAGNOSIS — F419 Anxiety disorder, unspecified: Secondary | ICD-10-CM | POA: Insufficient documentation

## 2018-11-22 DIAGNOSIS — K219 Gastro-esophageal reflux disease without esophagitis: Secondary | ICD-10-CM | POA: Diagnosis not present

## 2018-11-22 DIAGNOSIS — M25519 Pain in unspecified shoulder: Secondary | ICD-10-CM | POA: Diagnosis not present

## 2018-11-22 DIAGNOSIS — L304 Erythema intertrigo: Secondary | ICD-10-CM | POA: Insufficient documentation

## 2018-11-22 DIAGNOSIS — F329 Major depressive disorder, single episode, unspecified: Secondary | ICD-10-CM | POA: Diagnosis not present

## 2018-11-22 DIAGNOSIS — N62 Hypertrophy of breast: Secondary | ICD-10-CM | POA: Diagnosis not present

## 2018-11-22 DIAGNOSIS — Z881 Allergy status to other antibiotic agents status: Secondary | ICD-10-CM | POA: Insufficient documentation

## 2018-11-22 DIAGNOSIS — Z9049 Acquired absence of other specified parts of digestive tract: Secondary | ICD-10-CM | POA: Diagnosis not present

## 2018-11-22 HISTORY — DX: Unspecified temporomandibular joint disorder, unspecified side: M26.609

## 2018-11-22 HISTORY — PX: BREAST REDUCTION SURGERY: SHX8

## 2018-11-22 HISTORY — DX: Gastro-esophageal reflux disease without esophagitis: K21.9

## 2018-11-22 HISTORY — DX: Hypertrophy of breast: N62

## 2018-11-22 SURGERY — BREAST REDUCTION WITH LIPOSUCTION
Anesthesia: General | Site: Breast | Laterality: Bilateral

## 2018-11-22 MED ORDER — PROPOFOL 10 MG/ML IV BOLUS
INTRAVENOUS | Status: AC
  Filled 2018-11-22: qty 20

## 2018-11-22 MED ORDER — BUPIVACAINE-EPINEPHRINE (PF) 0.25% -1:200000 IJ SOLN
INTRAMUSCULAR | Status: AC
  Filled 2018-11-22: qty 30

## 2018-11-22 MED ORDER — SUGAMMADEX SODIUM 200 MG/2ML IV SOLN
INTRAVENOUS | Status: DC | PRN
  Administered 2018-11-22: 200 mg via INTRAVENOUS

## 2018-11-22 MED ORDER — EPINEPHRINE PF 1 MG/ML IJ SOLN
INTRAMUSCULAR | Status: DC | PRN
  Administered 2018-11-22: 1 mg

## 2018-11-22 MED ORDER — EPHEDRINE SULFATE-NACL 50-0.9 MG/10ML-% IV SOSY
PREFILLED_SYRINGE | INTRAVENOUS | Status: DC | PRN
  Administered 2018-11-22: 10 mg via INTRAVENOUS

## 2018-11-22 MED ORDER — LIDOCAINE 2% (20 MG/ML) 5 ML SYRINGE
INTRAMUSCULAR | Status: AC
  Filled 2018-11-22: qty 10

## 2018-11-22 MED ORDER — SCOPOLAMINE 1 MG/3DAYS TD PT72: 1.0000 | MEDICATED_PATCH | Freq: Once | TRANSDERMAL | Status: DC | PRN

## 2018-11-22 MED ORDER — PHENYLEPHRINE 40 MCG/ML (10ML) SYRINGE FOR IV PUSH (FOR BLOOD PRESSURE SUPPORT)
PREFILLED_SYRINGE | INTRAVENOUS | Status: DC | PRN
  Administered 2018-11-22: 80 ug via INTRAVENOUS
  Administered 2018-11-22 (×4): 120 ug via INTRAVENOUS
  Administered 2018-11-22: 80 ug via INTRAVENOUS

## 2018-11-22 MED ORDER — SUGAMMADEX SODIUM 200 MG/2ML IV SOLN
INTRAVENOUS | Status: AC
  Filled 2018-11-22: qty 2

## 2018-11-22 MED ORDER — ESMOLOL HCL 100 MG/10ML IV SOLN
INTRAVENOUS | Status: AC
  Filled 2018-11-22: qty 20

## 2018-11-22 MED ORDER — FENTANYL CITRATE (PF) 100 MCG/2ML IJ SOLN
INTRAMUSCULAR | Status: AC
  Filled 2018-11-22: qty 2

## 2018-11-22 MED ORDER — OXYCODONE-ACETAMINOPHEN 5-325 MG PO TABS
ORAL_TABLET | ORAL | Status: AC
  Filled 2018-11-22: qty 1

## 2018-11-22 MED ORDER — LIDOCAINE HCL 2 % IJ SOLN
INTRAMUSCULAR | Status: DC | PRN
  Administered 2018-11-22: 100 mL

## 2018-11-22 MED ORDER — EPHEDRINE 5 MG/ML INJ
INTRAVENOUS | Status: AC
  Filled 2018-11-22: qty 10

## 2018-11-22 MED ORDER — FENTANYL CITRATE (PF) 100 MCG/2ML IJ SOLN
INTRAMUSCULAR | Status: DC | PRN
  Administered 2018-11-22: 50 ug via INTRAVENOUS
  Administered 2018-11-22: 25 ug via INTRAVENOUS
  Administered 2018-11-22: 50 ug via INTRAVENOUS
  Administered 2018-11-22: 25 ug via INTRAVENOUS
  Administered 2018-11-22 (×5): 50 ug via INTRAVENOUS

## 2018-11-22 MED ORDER — CEFAZOLIN SODIUM-DEXTROSE 2-4 GM/100ML-% IV SOLN
2.0000 g | INTRAVENOUS | Status: AC
  Administered 2018-11-22: 2 g via INTRAVENOUS

## 2018-11-22 MED ORDER — PROPOFOL 10 MG/ML IV BOLUS
INTRAVENOUS | Status: DC | PRN
  Administered 2018-11-22: 200 mg via INTRAVENOUS

## 2018-11-22 MED ORDER — SODIUM BICARBONATE 4 % IV SOLN
INTRAVENOUS | Status: AC
  Filled 2018-11-22: qty 5

## 2018-11-22 MED ORDER — DEXAMETHASONE SODIUM PHOSPHATE 10 MG/ML IJ SOLN
INTRAMUSCULAR | Status: AC
  Filled 2018-11-22: qty 1

## 2018-11-22 MED ORDER — ONDANSETRON HCL 4 MG/2ML IJ SOLN: 4.0000 mg | Freq: Once | INTRAMUSCULAR | Status: DC | PRN

## 2018-11-22 MED ORDER — BUPIVACAINE HCL (PF) 0.5 % IJ SOLN
INTRAMUSCULAR | Status: AC
  Filled 2018-11-22: qty 30

## 2018-11-22 MED ORDER — DEXAMETHASONE SODIUM PHOSPHATE 10 MG/ML IJ SOLN
INTRAMUSCULAR | Status: DC | PRN
  Administered 2018-11-22: 10 mg via INTRAVENOUS

## 2018-11-22 MED ORDER — CEFAZOLIN SODIUM-DEXTROSE 2-4 GM/100ML-% IV SOLN
INTRAVENOUS | Status: AC
  Filled 2018-11-22: qty 100

## 2018-11-22 MED ORDER — ONDANSETRON HCL 4 MG/2ML IJ SOLN
INTRAMUSCULAR | Status: DC | PRN
  Administered 2018-11-22 (×2): 4 mg via INTRAVENOUS

## 2018-11-22 MED ORDER — LACTATED RINGERS IV SOLN
INTRAVENOUS | Status: AC | PRN
  Administered 2018-11-22: 1000 mL via INTRAVENOUS

## 2018-11-22 MED ORDER — MIDAZOLAM HCL 2 MG/2ML IJ SOLN
INTRAMUSCULAR | Status: DC | PRN
  Administered 2018-11-22: 2 mg via INTRAVENOUS

## 2018-11-22 MED ORDER — MIDAZOLAM HCL 2 MG/2ML IJ SOLN
INTRAMUSCULAR | Status: AC
  Filled 2018-11-22: qty 2

## 2018-11-22 MED ORDER — MEPERIDINE HCL 25 MG/ML IJ SOLN: 6.2500 mg | INTRAMUSCULAR | Status: DC | PRN

## 2018-11-22 MED ORDER — CHLORHEXIDINE GLUCONATE CLOTH 2 % EX PADS: 6.0000 | MEDICATED_PAD | Freq: Once | CUTANEOUS | Status: DC

## 2018-11-22 MED ORDER — MIDAZOLAM HCL 2 MG/2ML IJ SOLN: 1.0000 mg | INTRAMUSCULAR | Status: DC | PRN

## 2018-11-22 MED ORDER — SUCCINYLCHOLINE CHLORIDE 200 MG/10ML IV SOSY
PREFILLED_SYRINGE | INTRAVENOUS | Status: AC
  Filled 2018-11-22: qty 10

## 2018-11-22 MED ORDER — ONDANSETRON HCL 4 MG/2ML IJ SOLN
INTRAMUSCULAR | Status: AC
  Filled 2018-11-22: qty 2

## 2018-11-22 MED ORDER — FENTANYL CITRATE (PF) 100 MCG/2ML IJ SOLN: 50.0000 ug | INTRAMUSCULAR | Status: DC | PRN

## 2018-11-22 MED ORDER — ROCURONIUM BROMIDE 50 MG/5ML IV SOSY
PREFILLED_SYRINGE | INTRAVENOUS | Status: AC
  Filled 2018-11-22: qty 15

## 2018-11-22 MED ORDER — DILTIAZEM LOAD VIA INFUSION: INTRAVENOUS | Status: DC | PRN

## 2018-11-22 MED ORDER — LACTATED RINGERS IV SOLN
INTRAVENOUS | Status: DC
  Administered 2018-11-22 (×2): via INTRAVENOUS

## 2018-11-22 MED ORDER — SODIUM BICARBONATE 4 % IV SOLN
INTRAVENOUS | Status: DC | PRN
  Administered 2018-11-22: 12.5 mL via INTRAVENOUS

## 2018-11-22 MED ORDER — OXYCODONE-ACETAMINOPHEN 5-325 MG PO TABS
1.0000 | ORAL_TABLET | Freq: Once | ORAL | Status: AC
  Administered 2018-11-22: 1 via ORAL

## 2018-11-22 MED ORDER — PHENYLEPHRINE 40 MCG/ML (10ML) SYRINGE FOR IV PUSH (FOR BLOOD PRESSURE SUPPORT)
PREFILLED_SYRINGE | INTRAVENOUS | Status: AC
  Filled 2018-11-22: qty 20

## 2018-11-22 MED ORDER — DEXMEDETOMIDINE HCL IN NACL 200 MCG/50ML IV SOLN
INTRAVENOUS | Status: AC
  Filled 2018-11-22: qty 50

## 2018-11-22 MED ORDER — LIDOCAINE 2% (20 MG/ML) 5 ML SYRINGE
INTRAMUSCULAR | Status: DC | PRN
  Administered 2018-11-22: 60 mg via INTRAVENOUS

## 2018-11-22 MED ORDER — BUPIVACAINE HCL (PF) 0.25 % IJ SOLN
INTRAMUSCULAR | Status: AC
  Filled 2018-11-22: qty 30

## 2018-11-22 MED ORDER — LIDOCAINE 2% (20 MG/ML) 5 ML SYRINGE
INTRAMUSCULAR | Status: AC
  Filled 2018-11-22: qty 5

## 2018-11-22 MED ORDER — HYDROMORPHONE HCL 1 MG/ML IJ SOLN: 0.2500 mg | INTRAMUSCULAR | Status: DC | PRN

## 2018-11-22 MED ORDER — LIDOCAINE HCL 2 % IJ SOLN
INTRAMUSCULAR | Status: AC
  Filled 2018-11-22: qty 100

## 2018-11-22 MED ORDER — EPINEPHRINE 30 MG/30ML IJ SOLN
INTRAMUSCULAR | Status: AC
  Filled 2018-11-22: qty 1

## 2018-11-22 MED ORDER — ROCURONIUM BROMIDE 10 MG/ML (PF) SYRINGE
PREFILLED_SYRINGE | INTRAVENOUS | Status: DC | PRN
  Administered 2018-11-22: 10 mg via INTRAVENOUS
  Administered 2018-11-22: 50 mg via INTRAVENOUS
  Administered 2018-11-22 (×2): 10 mg via INTRAVENOUS

## 2018-11-22 SURGICAL SUPPLY — 76 items
APL SKNCLS STERI-STRIP NONHPOA (GAUZE/BANDAGES/DRESSINGS) ×2
BAG DECANTER FOR FLEXI CONT (MISCELLANEOUS) ×1 IMPLANT
BENZOIN TINCTURE PRP APPL 2/3 (GAUZE/BANDAGES/DRESSINGS) ×4 IMPLANT
BINDER BREAST LRG (GAUZE/BANDAGES/DRESSINGS) IMPLANT
BINDER BREAST XLRG (GAUZE/BANDAGES/DRESSINGS) IMPLANT
BINDER BREAST XXLRG (GAUZE/BANDAGES/DRESSINGS) ×1 IMPLANT
BLADE KNIFE  20 PERSONNA (BLADE)
BLADE KNIFE 20 PERSONNA (BLADE) IMPLANT
BLADE KNIFE PERSONA 10 (BLADE) ×6 IMPLANT
BLADE KNIFE PERSONA 15 (BLADE) ×3 IMPLANT
CANISTER SUCT 1200ML W/VALVE (MISCELLANEOUS) ×2 IMPLANT
COVER BACK TABLE 60X90IN (DRAPES) ×2 IMPLANT
COVER MAYO STAND STRL (DRAPES) ×2 IMPLANT
COVER WAND RF STERILE (DRAPES) IMPLANT
DECANTER SPIKE VIAL GLASS SM (MISCELLANEOUS) ×2 IMPLANT
DRAIN CHANNEL 10F 3/8 F FF (DRAIN) ×4 IMPLANT
DRAPE LAPAROSCOPIC ABDOMINAL (DRAPES) ×2 IMPLANT
DRSG PAD ABDOMINAL 8X10 ST (GAUZE/BANDAGES/DRESSINGS) ×8 IMPLANT
ELECT REM PT RETURN 9FT ADLT (ELECTROSURGICAL) ×2
ELECTRODE REM PT RTRN 9FT ADLT (ELECTROSURGICAL) ×1 IMPLANT
EVACUATOR SILICONE 100CC (DRAIN) ×4 IMPLANT
GAUZE SPONGE 4X4 12PLY STRL (GAUZE/BANDAGES/DRESSINGS) ×4 IMPLANT
GAUZE XEROFORM 5X9 LF (GAUZE/BANDAGES/DRESSINGS) ×4 IMPLANT
GLOVE BIO SURGEON STRL SZ 6.5 (GLOVE) IMPLANT
GLOVE BIOGEL M STRL SZ7.5 (GLOVE) ×1 IMPLANT
GLOVE BIOGEL PI IND STRL 8 (GLOVE) ×1 IMPLANT
GLOVE BIOGEL PI INDICATOR 8 (GLOVE) ×4
GLOVE ECLIPSE 7.0 STRL STRAW (GLOVE) ×2 IMPLANT
GLOVE SURG SYN 8.0 (GLOVE) ×6 IMPLANT
GLOVE SURG SYN 8.0 PF PI (GLOVE) IMPLANT
GOWN STRL REIN XL XLG (GOWN DISPOSABLE) ×1 IMPLANT
GOWN STRL REUS W/ TWL LRG LVL3 (GOWN DISPOSABLE) IMPLANT
GOWN STRL REUS W/ TWL XL LVL3 (GOWN DISPOSABLE) ×2 IMPLANT
GOWN STRL REUS W/TWL LRG LVL3 (GOWN DISPOSABLE)
GOWN STRL REUS W/TWL XL LVL3 (GOWN DISPOSABLE) ×2
IV LACTATED RINGERS 1000ML (IV SOLUTION) ×1 IMPLANT
IV NS 500ML (IV SOLUTION)
IV NS 500ML BAXH (IV SOLUTION) IMPLANT
LINER CANISTER 1000CC FLEX (MISCELLANEOUS) IMPLANT
MARKER SKIN DUAL TIP RULER LAB (MISCELLANEOUS) ×2 IMPLANT
NDL SAFETY ECLIPSE 18X1.5 (NEEDLE) IMPLANT
NDL SPNL 18GX3.5 QUINCKE PK (NEEDLE) ×1 IMPLANT
NEEDLE HYPO 18GX1.5 SHARP (NEEDLE) ×2
NEEDLE SPNL 18GX3.5 QUINCKE PK (NEEDLE) IMPLANT
NS IRRIG 1000ML POUR BTL (IV SOLUTION) ×5 IMPLANT
PACK BASIN DAY SURGERY FS (CUSTOM PROCEDURE TRAY) ×2 IMPLANT
PAD ALCOHOL SWAB (MISCELLANEOUS) ×6 IMPLANT
PEN SKIN MARKING BROAD TIP (MISCELLANEOUS) ×2 IMPLANT
PIN SAFETY STERILE (MISCELLANEOUS) ×2 IMPLANT
SHEETING SILICONE GEL EPI DERM (MISCELLANEOUS) IMPLANT
SLEEVE SCD COMPRESS KNEE MED (MISCELLANEOUS) ×2 IMPLANT
SPECIMEN JAR MEDIUM (MISCELLANEOUS) IMPLANT
SPECIMEN JAR X LARGE (MISCELLANEOUS) ×4 IMPLANT
SPONGE LAP 18X18 RF (DISPOSABLE) ×8 IMPLANT
STRIP SUTURE WOUND CLOSURE 1/2 (SUTURE) ×10 IMPLANT
SUT MNCRL AB 3-0 PS2 18 (SUTURE) ×14 IMPLANT
SUT MON AB 2-0 CT1 36 (SUTURE) IMPLANT
SUT MON AB 5-0 PS2 18 (SUTURE) ×4 IMPLANT
SUT PROLENE 3 0 PS 2 (SUTURE) ×12 IMPLANT
SUT VLOC 90 P-14 23 (SUTURE) ×2 IMPLANT
SYR 20CC LL (SYRINGE) IMPLANT
SYR 50ML LL SCALE MARK (SYRINGE) ×3 IMPLANT
SYR CONTROL 10ML LL (SYRINGE) IMPLANT
SYR TB 1ML LL NO SAFETY (SYRINGE) ×1 IMPLANT
TAPE HYPAFIX 6X30 (GAUZE/BANDAGES/DRESSINGS) ×2 IMPLANT
TAPE MEASURE 72IN RETRACT (INSTRUMENTS) ×1
TAPE MEASURE LINEN 72IN RETRCT (INSTRUMENTS) IMPLANT
TAPE PAPER 1X10 WHT MICROPORE (GAUZE/BANDAGES/DRESSINGS) ×2 IMPLANT
TOWEL GREEN STERILE FF (TOWEL DISPOSABLE) ×7 IMPLANT
TRAY DSU PREP LF (CUSTOM PROCEDURE TRAY) ×3 IMPLANT
TUBE CONNECTING 20X1/4 (TUBING) ×2 IMPLANT
TUBING INFILTRATION IT-10001 (TUBING) ×1 IMPLANT
TUBING SET GRADUATE ASPIR 12FT (MISCELLANEOUS) ×1 IMPLANT
UNDERPAD 30X30 (UNDERPADS AND DIAPERS) ×4 IMPLANT
VAC PENCILS W/TUBING CLEAR (MISCELLANEOUS) ×2 IMPLANT
YANKAUER SUCT BULB TIP NO VENT (SUCTIONS) ×2 IMPLANT

## 2018-11-22 NOTE — Anesthesia Preprocedure Evaluation (Signed)
Anesthesia Evaluation  Patient identified by MRN, date of birth, ID band Patient awake    Reviewed: Allergy & Precautions, NPO status , Patient's Chart, lab work & pertinent test results  Airway Mallampati: I  TM Distance: >3 FB Neck ROM: Full    Dental   Pulmonary    Pulmonary exam normal        Cardiovascular Normal cardiovascular exam     Neuro/Psych Anxiety Depression    GI/Hepatic GERD  Medicated and Controlled,  Endo/Other    Renal/GU      Musculoskeletal   Abdominal   Peds  Hematology   Anesthesia Other Findings   Reproductive/Obstetrics                             Anesthesia Physical Anesthesia Plan  ASA: II  Anesthesia Plan: General   Post-op Pain Management:    Induction: Intravenous  PONV Risk Score and Plan: 3 and Ondansetron, Dexamethasone and Midazolam  Airway Management Planned: Oral ETT  Additional Equipment:   Intra-op Plan:   Post-operative Plan: Extubation in OR  Informed Consent: I have reviewed the patients History and Physical, chart, labs and discussed the procedure including the risks, benefits and alternatives for the proposed anesthesia with the patient or authorized representative who has indicated his/her understanding and acceptance.     Plan Discussed with: CRNA and Surgeon  Anesthesia Plan Comments:         Anesthesia Quick Evaluation

## 2018-11-22 NOTE — Transfer of Care (Signed)
Immediate Anesthesia Transfer of Care Note  Patient: Sandra Parker  Procedure(s) Performed: BREAST REDUCTION WITH LIPOSUCTION (Bilateral Breast)  Patient Location: PACU  Anesthesia Type:General  Level of Consciousness: awake and alert   Airway & Oxygen Therapy: Patient Spontanous Breathing and Patient connected to face mask oxygen  Post-op Assessment: Report given to RN and Post -op Vital signs reviewed and stable  Post vital signs: Reviewed and stable  Last Vitals:  Vitals Value Taken Time  BP    Temp    Pulse 113 11/22/2018 10:47 AM  Resp 13 11/22/2018 10:47 AM  SpO2 100 % 11/22/2018 10:47 AM  Vitals shown include unvalidated device data.  Last Pain:  Vitals:   11/22/18 0634  TempSrc: Oral  PainSc: 0-No pain      Patients Stated Pain Goal: 3 (88/11/03 1594)  Complications: No apparent anesthesia complications

## 2018-11-22 NOTE — Discharge Instructions (Signed)
Breast Reduction Care After Refer to this sheet in the next few weeks. These instructions provide you with information on caring for yourself after your procedure. Your caregiver may also give you more specific instructions. Your treatment has been planned according to current medical practices, but problems sometimes occur. Call your caregiver if you have any problems or questions after your procedure. HOME CARE INSTRUCTIONS  Do not lift more than 5 pounds with one arm, or 10 pounds with both arms, for 1 month.   Do not sleep on your stomach for 4 to 6 weeks.   Do not do vigorous exercise such as bouncing, aerobics, or jumping for 6 weeks. Walking is not restricted.   Do not drive while you are taking prescription pain medicine.   Avoid prolonged sun exposure.   Keep dressings dry and clean  Measure jp drainage every 12 hrs and measure   You may slowly go back to your normal diet. Start with a light meal and increase as comfortable.   You may shower 24 hours after your drains are removed unless instructed differently by your caregiver.   Take your pain medicine as prescribed. Discomfort is normal after breast reduction surgery.   Keep the head of your bed elevated 40 degrees   : Call the office if you notice:  You have a fever.   You notice drainage from the incision that smells bad.   You have persistent pain.   You have persistent bleeding from the incision or nipple discharge.   You develop increased swelling or swelling that is greater in one breast than in the other.  MAKE SURE YOU:   Understand these instructions.   Will watch your condition.   Will get help right away if you are not doing well or get worse.  Document Released: 07/29/2004 Document Revised: 08/27/2011 Document Reviewed: 03/09/2008 [facility] Patient Information 2012 [place].   Post Anesthesia Home Care Instructions  Activity: Get plenty of rest for the remainder of the day. A  responsible individual must stay with you for 24 hours following the procedure.  For the next 24 hours, DO NOT: -Drive a car -Paediatric nurse -Drink alcoholic beverages -Take any medication unless instructed by your physician -Make any legal decisions or sign important papers.  Meals: Start with liquid foods such as gelatin or soup. Progress to regular foods as tolerated. Avoid greasy, spicy, heavy foods. If nausea and/or vomiting occur, drink only clear liquids until the nausea and/or vomiting subsides. Call your physician if vomiting continues.  Special Instructions/Symptoms: Your throat may feel dry or sore from the anesthesia or the breathing tube placed in your throat during surgery. If this causes discomfort, gargle with warm salt water. The discomfort should disappear within 24 hours.  If you had a scopolamine patch placed behind your ear for the management of post- operative nausea and/or vomiting:  1. The medication in the patch is effective for 72 hours, after which it should be removed.  Wrap patch in a tissue and discard in the trash. Wash hands thoroughly with soap and water. 2. You may remove the patch earlier than 72 hours if you experience unpleasant side effects which may include dry mouth, dizziness or visual disturbances. 3. Avoid touching the patch. Wash your hands with soap and water after contact with the patch.   About my Jackson-Pratt Bulb Drain  What is a Jackson-Pratt bulb? A Jackson-Pratt is a soft, round device used to collect drainage. It is connected to a long, thin  drainage catheter, which is held in place by one or two small stiches near your surgical incision site. When the bulb is squeezed, it forms a vacuum, forcing the drainage to empty into the bulb.  Emptying the Jackson-Pratt bulb- To empty the bulb: 1. Release the plug on the top of the bulb. 2. Pour the bulb's contents into a measuring container which your nurse will provide. 3. Record the time  emptied and amount of drainage. Empty the drain(s) as often as your     doctor or nurse recommends.  Date                  Time                    Amount (Drain 1)                 Amount (Drain 2)  _____________________________________________________________________  _____________________________________________________________________  _____________________________________________________________________  _____________________________________________________________________  _____________________________________________________________________  _____________________________________________________________________  _____________________________________________________________________  _____________________________________________________________________  Squeezing the Jackson-Pratt Bulb- To squeeze the bulb: 1. Make sure the plug at the top of the bulb is open. 2. Squeeze the bulb tightly in your fist. You will hear air squeezing from the bulb. 3. Replace the plug while the bulb is squeezed. 4. Use a safety pin to attach the bulb to your clothing. This will keep the catheter from     pulling at the bulb insertion site.  When to call your doctor- Call your doctor if:  Drain site becomes red, swollen or hot.  You have a fever greater than 101 degrees F.  There is oozing at the drain site.  Drain falls out (apply a guaze bandage over the drain hole and secure it with tape).  Drainage increases daily not related to activity patterns. (You will usually have more drainage when you are active than when you are resting.)  Drainage has a bad odor.

## 2018-11-22 NOTE — Anesthesia Procedure Notes (Signed)
Date/Time: 11/22/2018 10:40 AM Performed by: Cynda Familia, CRNA Oxygen Delivery Method: Simple face mask Placement Confirmation: positive ETCO2 and breath sounds checked- equal and bilateral Dental Injury: Teeth and Oropharynx as per pre-operative assessment

## 2018-11-22 NOTE — Brief Op Note (Signed)
11/22/2018  10:36 AM  PATIENT:  Sandra Parker  42 y.o. female  PRE-OPERATIVE DIAGNOSIS:  MACROMASTIA  POST-OPERATIVE DIAGNOSIS:  MACROMASTIA  PROCEDURE:  Procedure(s) with comments: BREAST REDUCTION WITH LIPOSUCTION (Bilateral) - Bilateral   SURGEON:  Surgeon(s) and Role:    * Cristine Polio, MD - Primary  PHYSICIAN ASSISTANT:   ASSISTANTS: none   ANESTHESIA:   general  EBL:  100 mL   BLOOD ADMINISTERED:none  DRAINS: (cc) Jackson-Pratt drain(s) with closed bulb suction in the 10 fully flutede   LOCAL MEDICATIONS USED:  LIDOCAINE   SPECIMEN:  Excision  DISPOSITION OF SPECIMEN:  PATHOLOGY  COUNTS:  YES  TOURNIQUET:  * No tourniquets in log *  DICTATION: .Other Dictation: Dictation Number [id number]  PLAN OF CARE: Discharge to home after PACU  PATIENT DISPOSITION:  PACU - hemodynamically stable.   Delay start of Pharmacological VTE agent (>24hrs) due to surgical blood loss or risk of bleeding: yes

## 2018-11-22 NOTE — Op Note (Signed)
NAME: Sandra Parker, Sandra Parker MEDICAL RECORD [id number] ACCOUNT [id number] DATE OF BIRTH:03/06/1976 FACILITY: [facility] LOCATION: [facility] PHYSICIAN:Dema Timmons Octavia Heir, MD  OPERATIVE REPORT  DATE OF PROCEDURE:  11/22/2018  This is a 42 year old lady with severe macromastia.  Back and shoulder pain secondary to large pendulous breasts, intertriginous changes, and rashes and hives in the past.  PROCEDURE:  Planned bilateral breast reduction using the inferior pedicle technique.  Also, patient has had increasing excessive breast tissue laterally at the armpit areas and the lateral breast regions.  Excision of that as well using liposuction  assistance.  ANESTHESIA:  General.  The procedure in detail was explained to the patient of the attendant risks, and no 100% guarantee of the results of the surgery.  The patient understands and consents to surgery.  Preoperative drawings were done.  PROCEDURE IN DETAIL:  She was then taken to the operating room and placed on the operating room table in the supine position.  She was given adequate general anesthesia, intubated orally.  Prep was done to the chest, breast areas in routine fashion using  Hibiclens soap and solution.  Walled off with sterile towels and drapes so as to make a sterile field.  Tumescent solution was injected in the right and left sides, 500 cc per side.  The wounds were scored with a #15 blade, and the skin of the inferior  pedicle was deepithelialized with a #20 blade.  Medial and lateral fatty dermal pedicles were excised.  The new keyhole area was also debulked, and after proper hemostasis, the flaps were transposed and stayed with 3-0 Prolene suture.  Subcutaneous  closure was done with 3-0 Monocryl x2 layers and a running subcuticular stitch of 3-0 Monocryl and 5-0 Monocryl throughout the inverted T with good symmetry.  The nipple areolar complex was reexamined showing excellent blood supply.  The wounds were  drained with #10 fully  fluted Blake drains, which were placed in the depths of the wound and brought out the lateral portion of the incision.  After liposuction had been fashioned, using a [place] catheter #3 flat removing over 200-300 mL of excessive  breast tissue from the right and left breast areas.  The symmetry was good.  The wounds were cleansed.  Steri-Strips, soft dressings and wraps were applied to all the areas.  She tolerated the procedures very well and was taken to recovery in excellent  condition.  ESTIMATED BLOOD LOSS:  Less than 100 mL.  COMPLICATIONS:  None.  DISPOSITION:  The patient was then taken to the recovery room in excellent condition.  LN/NUANCE  D:11/22/2018 T:11/22/2018 JOB:[id number]

## 2018-11-22 NOTE — Anesthesia Procedure Notes (Addendum)
Procedure Name: Intubation Date/Time: 11/22/2018 7:48 AM Performed by: Cynda Familia, CRNA Pre-anesthesia Checklist: Patient identified, Emergency Drugs available, Suction available and Patient being monitored Patient Re-evaluated:Patient Re-evaluated prior to induction Oxygen Delivery Method: Circle System Utilized Preoxygenation: Pre-oxygenation with 100% oxygen Induction Type: IV induction Ventilation: Mask ventilation without difficulty Laryngoscope Size: Miller and 2 Grade View: Grade I Tube type: Oral Tube size: 7.0 mm Number of attempts: 1 Airway Equipment and Method: Stylet and Oral airway Placement Confirmation: ETT inserted through vocal cords under direct vision,  positive ETCO2 and breath sounds checked- equal and bilateral Secured at: 21 cm Tube secured with: Tape Dental Injury: Teeth and Oropharynx as per pre-operative assessment  Comments: Smooth IV induction Ossey-- intubation AM CRNA atraumatic-- teeth and mouth as preop ( irregular surfaces front teeth-- dental advisory given by CRNA preop)-- bilat BS Ossey

## 2018-11-22 NOTE — Anesthesia Postprocedure Evaluation (Signed)
Anesthesia Post Note  Patient: Sandra Parker  Procedure(s) Performed: BREAST REDUCTION WITH LIPOSUCTION (Bilateral Breast)     Patient location during evaluation: PACU Anesthesia Type: General Level of consciousness: awake and alert Pain management: pain level controlled Vital Signs Assessment: post-procedure vital signs reviewed and stable Respiratory status: spontaneous breathing, nonlabored ventilation, respiratory function stable and patient connected to nasal cannula oxygen Cardiovascular status: blood pressure returned to baseline and stable Postop Assessment: no apparent nausea or vomiting Anesthetic complications: no    Last Vitals:  Vitals:   11/22/18 1115 11/22/18 1130  BP: 112/75 (!) 109/54  Pulse: (!) 105 (!) 104  Resp: 10 16  Temp:    SpO2: 99% 100%    Last Pain:  Vitals:   11/22/18 1130  TempSrc:   PainSc: 3                  Windel Keziah DAVID

## 2018-11-22 NOTE — H&P (Signed)
Sandra Parker is an 42 y.o. female.   Chief Complaint: macromastia HPI: Increased macromastia with intertrigo and accessory breast tissue  Past Medical History:  Diagnosis Date  . Anxiety   . Depression   . GERD (gastroesophageal reflux disease)   . Macromastia 10/2018  . TMJ (temporomandibular joint syndrome)     Past Surgical History:  Procedure Laterality Date  . APPENDECTOMY    . HYSTERECTOMY ABDOMINAL WITH SALPINGO-OOPHORECTOMY Left 04/15/2002  . HYSTEROSCOPY W/D&C  08/02/2001  . LAPAROSCOPIC CHOLECYSTECTOMY  08/16/2010    History reviewed. No pertinent family history. Social History:  reports that she has never smoked. She has never used smokeless tobacco. She reports that she drank alcohol. She reports that she does not use drugs.  Allergies:  Allergies  Allergen Reactions  . Prozac [Fluoxetine] Other (See Comments)    HEADACHES  . Wellbutrin [Bupropion] Other (See Comments)    NIGHTMARES  . Cephalexin Rash  . Ciprofloxacin Rash  . Relafen [Nabumetone] Other (See Comments)    UNKNOWN    Medications Prior to Admission  Medication Sig Dispense Refill  . estradiol (ESTRACE) 1 MG tablet Take 1 mg by mouth daily.    . Multiple Vitamins-Minerals (MULTIVITAMIN ADULT) CHEW Chew by mouth.    . pantoprazole (PROTONIX) 40 MG tablet Take 1 tablet (40 mg total) by mouth daily. 90 tablet 3    No results found for this or any previous visit (from the past 48 hour(s)). No results found.  Review of Systems  Constitutional: Negative.   HENT: Negative.   Eyes: Negative.   Respiratory: Negative.   Cardiovascular: Negative.   Gastrointestinal: Negative.   Genitourinary: Negative.   Musculoskeletal: Positive for back pain and myalgias.  Skin: Positive for rash.  Neurological: Negative.   Endo/Heme/Allergies: Negative.   Psychiatric/Behavioral: Negative.     Blood pressure 114/74, pulse 82, temperature 98.5 F (36.9 C), temperature source Oral, resp. rate 18, height 5\' 1"   (1.549 m), weight 75.9 kg, SpO2 99 %. Physical Exam  Constitutional: She is oriented to person, place, and time. She appears well-developed and well-nourished.  HENT:  Head: Normocephalic.  Eyes: Pupils are equal, round, and reactive to light. EOM are normal.  Neck: Normal range of motion. Tracheal deviation present. No thyromegaly present.  Cardiovascular: Normal rate and regular rhythm.  Respiratory: Effort normal and breath sounds normal.  GI: Soft. Bowel sounds are normal. There is tenderness. There is rebound.  Musculoskeletal: Normal range of motion.  Neurological: She is alert and oriented to person, place, and time.  Skin: Skin is warm. Rash noted.   severe macromastia and intertrigo  Assessment/Plan Severe macromastia for bilateral breast reductions and excission of accessory breast tissue  Ellese Julius L, MD 11/22/2018, 7:12 AM

## 2018-11-23 ENCOUNTER — Encounter (HOSPITAL_BASED_OUTPATIENT_CLINIC_OR_DEPARTMENT_OTHER): Payer: Self-pay | Admitting: Specialist

## 2018-11-24 ENCOUNTER — Other Ambulatory Visit: Payer: Self-pay | Admitting: Family Medicine

## 2018-11-24 NOTE — Telephone Encounter (Signed)
Ok to refill??  Last office visit 09/03/2018.  Last refill on Hydrocodone 08/02/2018.  Last refill on Xanax 04/30/2018.

## 2019-01-17 ENCOUNTER — Ambulatory Visit: Payer: BC Managed Care – PPO | Admitting: Family Medicine

## 2019-01-21 ENCOUNTER — Other Ambulatory Visit: Payer: Self-pay

## 2019-01-21 ENCOUNTER — Encounter: Payer: Self-pay | Admitting: Family Medicine

## 2019-01-21 ENCOUNTER — Ambulatory Visit: Payer: BC Managed Care – PPO | Admitting: Family Medicine

## 2019-01-21 VITALS — BP 128/74 | HR 82 | Temp 98.4°F | Resp 18 | Ht 61.0 in | Wt 165.0 lb

## 2019-01-21 DIAGNOSIS — Z79890 Hormone replacement therapy (postmenopausal): Secondary | ICD-10-CM | POA: Diagnosis not present

## 2019-01-21 DIAGNOSIS — J069 Acute upper respiratory infection, unspecified: Secondary | ICD-10-CM | POA: Diagnosis not present

## 2019-01-21 DIAGNOSIS — Z20828 Contact with and (suspected) exposure to other viral communicable diseases: Secondary | ICD-10-CM

## 2019-01-21 DIAGNOSIS — E8940 Asymptomatic postprocedural ovarian failure: Secondary | ICD-10-CM

## 2019-01-21 MED ORDER — GUAIFENESIN-CODEINE 100-10 MG/5ML PO SOLN: 5.0000 mL | Freq: Four times a day (QID) | ORAL | 0 refills | Status: DC | PRN

## 2019-01-21 MED ORDER — ESTRADIOL 0.5 MG PO TABS: 0.5000 mg | ORAL_TABLET | Freq: Every day | ORAL | 3 refills | Status: DC

## 2019-01-21 NOTE — Assessment & Plan Note (Signed)
Recent mammogram negative Restart estrogen at 0.5mg  daily, will see how this affects her symptoms and weight before doing in any weight loss medication Continue low carb and exercise routine

## 2019-01-21 NOTE — Patient Instructions (Addendum)
Vitamin C  Use robitussin codiene  Claritin or zyrtec for nasal drainage  Note for work for today, can return Monday  Restart estrogen  F/U March for physical

## 2019-01-21 NOTE — Progress Notes (Signed)
   Subjective:    Patient ID: Sandra Parker, female    DOB: 07/18/1976, 43 y.o.   MRN: [id number]  Patient presents for Illness (x1 week- nonproductive cough, chest tightness, burning in chest, sore throat, exposure to flu, chills)   Cough without production for past week, raw throat, chest burning, aching. No fever. Exposure to flu. No vomiting, small amount diarrhea. Drinking lots of fluids.    Taking mucinex DM  Denies any wheezing   Been having difficulty losing weight states that she is walking an hour a day she is eating low carb with lower carbs.  She did come off her estrogen 3 months ago before she had her breast reduction surgery want to know if this may have contributed to this all in her weight.  She is interested in going back on the hormone she still has hot flashes vaginal dryness.  Hysterectomy in her 43s  Review Of Systems: per above   GEN- denies fatigue, fever, weight loss,weakness, recent illness HEENT- denies eye drainage, change in vision,+ nasal discharge, CVS- denies chest pain, palpitations RESP- denies SOB, +cough, wheeze ABD- denies N/V, change in stools, abd pain GU- denies dysuria, hematuria, dribbling, incontinence MSK- denies joint pain,+ muscle aches, injury Neuro- denies headache, dizziness, syncope, seizure activity       Objective:    BP 128/74   Pulse 82   Temp 98.4 F (36.9 C) (Oral)   Resp 18   Ht 5\' 1"  (1.549 m)   Wt 165 lb (74.8 kg)   SpO2 100%   BMI 31.18 kg/m  GEN- NAD, alert and oriented x3 HEENT- PERRL, EOMI, non injected sclera, pink conjunctiva, MMM, oropharynx mild injection, TM clear bilat no effusion,  No  maxillary sinus tenderness, no   Nasal drainage  Neck- Supple, no LAD CVS- RRR, no murmur RESP-CTAB EXT- No edema Pulses- Radial 2+         Assessment & Plan:      Problem List Items Addressed This Visit      Unprioritized   Surgical menopause on hormone replacement therapy    Recent mammogram negative Restart  estrogen at 0.5mg  daily, will see how this affects her symptoms and weight before doing in any weight loss medication Continue low carb and exercise routine       Other Visit Diagnoses    Viral URI    -  Primary   flu and strep neg. given robitussin codiene, use claritin, zyrtec, add vitamin C , no red flags on exam   Relevant Orders   STREP GROUP A AG, W/REFLEX TO CULT   Exposure to the flu       Relevant Orders   Influenza A and B Ag, Immunoassay      Note: This dictation was prepared with Dragon dictation along with smaller phrase technology. Any transcriptional errors that result from this process are unintentional.

## 2019-01-23 LAB — CULTURE, GROUP A STREP
MICRO NUMBER:: 100914
SOURCE:: 0
SPECIMEN QUALITY:: ADEQUATE

## 2019-01-23 LAB — STREP GROUP A AG, W/REFLEX TO CULT: STREPTOCOCCUS, GROUP A SCREEN (DIRECT): NOT DETECTED

## 2019-01-23 LAB — INFLUENZA A AND B AG, IMMUNOASSAY
INFLUENZA A ANTIGEN: NOT DETECTED
INFLUENZA B ANTIGEN: NOT DETECTED

## 2019-01-24 ENCOUNTER — Encounter: Payer: Self-pay | Admitting: *Deleted

## 2019-01-24 ENCOUNTER — Ambulatory Visit: Payer: BC Managed Care – PPO | Admitting: Family Medicine

## 2019-01-28 ENCOUNTER — Telehealth: Payer: Self-pay | Admitting: Family Medicine

## 2019-01-28 MED ORDER — AMOXICILLIN 875 MG PO TABS: 875.0000 mg | ORAL_TABLET | Freq: Two times a day (BID) | ORAL | 0 refills | Status: AC

## 2019-01-28 MED ORDER — FLUCONAZOLE 150 MG PO TABS: 150.0000 mg | ORAL_TABLET | Freq: Once | ORAL | 0 refills | Status: AC

## 2019-01-28 NOTE — Telephone Encounter (Signed)
MD please advise

## 2019-01-28 NOTE — Telephone Encounter (Signed)
Prescription sent to pharmacy.

## 2019-01-28 NOTE — Telephone Encounter (Signed)
Okay to send amoxicillin 875mg  BID x 10 days Give diflucan 150mg  po x 1 if needed for yeast infection after antibiotics

## 2019-01-28 NOTE — Telephone Encounter (Signed)
Pt is not feeling better from her last visit she has taken all the medication that she was asked to take. Wants to know if we can call in antibiotic to [facility]. She is still having the sinus issues with some facial pain

## 2019-02-15 ENCOUNTER — Other Ambulatory Visit: Payer: Self-pay | Admitting: Family Medicine

## 2019-02-18 ENCOUNTER — Telehealth: Payer: Self-pay | Admitting: *Deleted

## 2019-02-18 NOTE — Telephone Encounter (Signed)
Received call from patient.   Reports that she did restart the estrogen in regards to her weight loss, hot flashes and vaginal dryness. Reports that she took the medication for 1 week, but did not notice any improvement. Admitted that she is concerned about possible SE and cancer from Estrogen, so she stopped medication.   Reports that she continues to walk >1 hour daily, and is eating a low carb, low sugar diet. Reports that she is monitoring her portions as well. States that she has only noticed 4lb weight loss since her visit. Inquired as to if MD would prescribe Phentermine.   MD please advise.

## 2019-02-18 NOTE — Telephone Encounter (Signed)
Call placed to patient and patient made aware per VM.  

## 2019-02-18 NOTE — Telephone Encounter (Signed)
I dont recommend going back on phentermine Okay to stop the estrogen, she can take black cohosh or Estroven natural supplement OTC

## 2019-03-14 ENCOUNTER — Encounter: Payer: Self-pay | Admitting: Family Medicine

## 2019-03-14 ENCOUNTER — Other Ambulatory Visit: Payer: Self-pay

## 2019-03-14 ENCOUNTER — Ambulatory Visit: Payer: BC Managed Care – PPO | Admitting: Family Medicine

## 2019-03-14 VITALS — BP 122/68 | HR 60 | Temp 98.7°F | Resp 16 | Ht 61.0 in | Wt 164.0 lb

## 2019-03-14 DIAGNOSIS — R6889 Other general symptoms and signs: Secondary | ICD-10-CM

## 2019-03-14 LAB — INFLUENZA A AND B AG, IMMUNOASSAY
INFLUENZA A ANTIGEN: NOT DETECTED
INFLUENZA B ANTIGEN: NOT DETECTED

## 2019-03-14 MED ORDER — OSELTAMIVIR PHOSPHATE 75 MG PO CAPS: 75.0000 mg | ORAL_CAPSULE | Freq: Two times a day (BID) | ORAL | 0 refills | Status: DC

## 2019-03-14 MED ORDER — GUAIFENESIN-CODEINE 100-10 MG/5ML PO SOLN: 5.0000 mL | Freq: Four times a day (QID) | ORAL | 0 refills | Status: DC | PRN

## 2019-03-14 NOTE — Progress Notes (Signed)
   Subjective:    Patient ID: Sandra Parker, female    DOB: 04-Jan-1976, 43 y.o.   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 Yesterday she started feeling bad, no fever, but has had  bodyaches, fatigue, cough non productive started last night.   Taking tylenol flu.  NO TRAVEL   sHE DID NOT get the flu vaccine    Review Of Systems:  GEN- +fatigue, denies fever, weight loss,weakness, recent illness HEENT- denies eye drainage, change in vision, nasal discharge, CVS- denies chest pain, palpitations RESP- denies SOB, +cough, wheeze ABD- denies N/V, change in stools, abd pain GU- denies dysuria, hematuria, dribbling, incontinence MSK- denies joint pain, muscle aches, injury Neuro- denies headache, dizziness, syncope, seizure activity       Objective:    BP 122/68   Pulse 60   Temp 98.7 F (37.1 C) (Oral)   Resp 16   Ht 5\' 1"  (1.549 m)   Wt 164 lb (74.4 kg)   SpO2 97%   BMI 30.99 kg/m  GEN- NAD, alert and oriented x3,sick appearing  HEENT- PERRL, EOMI, non injected sclera, pink conjunctiva, MMM, oropharynx mild injection, TM clear bilat no effusion,  No  maxillary sinus tenderness, inflammed turbinates,  Nasal drainage  Neck- Supple, no LAD CVS- RRR, no murmur RESP-CTAB EXT- No edema Pulses- Radial 2+          Assessment & Plan:      Problem List Items Addressed This Visit    None    Visit Diagnoses    Flu-like symptoms    -  Primary   Flu negative in house, but 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 Any transcriptional errors that  result from this process are unintentional.

## 2019-03-14 NOTE — Patient Instructions (Signed)
Take the tamiflu Cough syrup Note for work given  F/U if you are not improving, get short of breath

## 2019-03-22 ENCOUNTER — Encounter: Payer: BC Managed Care – PPO | Admitting: Family Medicine

## 2019-03-22 ENCOUNTER — Encounter (INDEPENDENT_AMBULATORY_CARE_PROVIDER_SITE_OTHER): Payer: BC Managed Care – PPO

## 2019-03-29 ENCOUNTER — Ambulatory Visit (INDEPENDENT_AMBULATORY_CARE_PROVIDER_SITE_OTHER): Payer: BC Managed Care – PPO | Admitting: Bariatrics

## 2019-04-04 ENCOUNTER — Encounter: Payer: BC Managed Care – PPO | Admitting: Family Medicine

## 2019-04-12 ENCOUNTER — Ambulatory Visit: Payer: BC Managed Care – PPO | Admitting: Family Medicine

## 2019-04-12 ENCOUNTER — Ambulatory Visit (INDEPENDENT_AMBULATORY_CARE_PROVIDER_SITE_OTHER): Payer: BC Managed Care – PPO | Admitting: Bariatrics

## 2019-04-12 ENCOUNTER — Other Ambulatory Visit: Payer: Self-pay

## 2019-04-12 ENCOUNTER — Encounter: Payer: Self-pay | Admitting: Family Medicine

## 2019-04-12 VITALS — BP 110/62 | HR 82 | Temp 98.1°F | Resp 16 | Ht 61.0 in | Wt 163.0 lb

## 2019-04-12 DIAGNOSIS — R42 Dizziness and giddiness: Secondary | ICD-10-CM | POA: Diagnosis not present

## 2019-04-12 DIAGNOSIS — Z13220 Encounter for screening for lipoid disorders: Secondary | ICD-10-CM

## 2019-04-12 DIAGNOSIS — I959 Hypotension, unspecified: Secondary | ICD-10-CM | POA: Diagnosis not present

## 2019-04-12 MED ORDER — DIAZEPAM 2 MG PO TABS: 2.0000 mg | ORAL_TABLET | Freq: Two times a day (BID) | ORAL | 0 refills | Status: DC | PRN

## 2019-04-12 NOTE — Progress Notes (Signed)
Orthostatic Blood Pressure:    BP:  HR:  SPO2: Lying:   110/64  70  97   Sitting:  102/58  70  99  Standing:  98/58  70  98  Standing x2:  106/64  80  99

## 2019-04-12 NOTE — Progress Notes (Signed)
Subjective:    Patient ID: Sandra Parker, female    DOB: [birth year], 43 y.o.   MRN: [id number]  Patient presents for Hypotension (x1 month- known vertigo, but having increased dizzy spells- noted BP low (90/50's)- no known trigger- has happended while she is standing up, driving, etc)   Gets dizzy out of no where, where she feels sick her stomach.  She does have history of underlying vertigo.  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 Last episode was this past Saturday she was outside working in the yard and things starting spinning and she started feeling nauseous  A time before she was work looking at her computer and things started spinning.  Did try taking her meclizine note this prescription however expired in July 2019.  She is also been keeping an eye on her blood pressure states that is been low every time she has had spells.  The lowest that is been down to is 98/68 most of them have been upper 90s to 120s over 60-80  Drinking 80 ounces daily she is also eating regularly she does not feel sick otherwise no fever cough congestion sinus pressure     Review Of Systems:  GEN- denies fatigue, fever, weight loss,weakness, recent illness HEENT- denies eye drainage, change in vision, nasal discharge, CVS- denies chest pain, palpitations RESP- denies SOB, cough, wheeze ABD- denies N/V, change in stools, abd pain GU- denies dysuria, hematuria, dribbling, incontinence MSK- denies joint pain, muscle aches, injury Neuro- denies headache,+ dizziness, syncope, seizure activity       Objective:    BP 110/62   Pulse 82   Temp 98.1 F (36.7 C) (Oral)   Resp 16   Ht 5\' 1"  (1.549 m)   Wt 163 lb (73.9 kg)   SpO2 99%   BMI 30.80 kg/m  GEN- NAD, alert and oriented x3 HEENT- PERRL, EOMI, non injected sclera, pink conjunctiva,  MMM, oropharynx clear, TM clear bilat no effusion Neck- Supple, no thyromegaly, no bruit  CVS- RRR, no murmur RESP-CTAB Neuro-CNII-XII in tact, no focal deficits, neg rhomberg, normal finger to nose, normal rapid alternating movements, normal gait  EXT- No edema Pulses- Radial, DP- 2+        Assessment & Plan:      Problem List Items Addressed This Visit      Unprioritized   Vertigo    She has known vertigo which most of her symptoms appear to be positional vertigo.  Her meclizine has expired but she states it was not working very well in general when she would take it.  We will try him on Valium 2 mg at the onset of an episode.  She is not to take her alprazolam which she takes sparingly with this.  We will also get some labs done to check her metabolic panel and her TSH make sure there is nothing contributing and no anemia.  With regard to the hypotensive episodes.  She does not have any orthostasis here in the office based on her readings she appears to be drinking enough water.  She does not have this significant drop that I would consider along with the pots syndrome but at any rate we will make sure that she does add additional salt to her foods to see if  this makes a difference.  She is not on any medications that would cause hypotensive.  There is nothing in her blood work I will consider cardiac testing as the next step.  Setting of the COVID virus at this time unable to send her for any type of vertigo physical therapy however she can do some home exercises which I referred to her Youtube to see if that also helps       Other Visit Diagnoses    Hypotension, unspecified hypotension type    -  Primary   Relevant Orders   CBC with Differential/Platelet   Comprehensive metabolic panel   TSH   Screening cholesterol level       Relevant Orders   Lipid panel      Note: This dictation was prepared with Dragon dictation along with smaller phrase technology. Any transcriptional  errors that result from this process are unintentional.

## 2019-04-12 NOTE — Assessment & Plan Note (Signed)
She has known vertigo which most of her symptoms appear to be positional vertigo.  Her meclizine has expired but she states it was not working very well in general when she would take it.  We will try him on Valium 2 mg at the onset of an episode.  She is not to take her alprazolam which she takes sparingly with this.  We will also get some labs done to check her metabolic panel and her TSH make sure there is nothing contributing and no anemia.  With regard to the hypotensive episodes.  She does not have any orthostasis here in the office based on her readings she appears to be drinking enough water.  She does not have this significant drop that I would consider along with the pots syndrome but at any rate we will make sure that she does add additional salt to her foods to see if this makes a difference.  She is not on any medications that would cause hypotensive.  There is nothing in her blood work I will consider cardiac testing as the next step.  Setting of the COVID virus at this time unable to send her for any type of vertigo physical therapy however she can do some home exercises which I referred to her Youtube to see if that also helps

## 2019-04-12 NOTE — Patient Instructions (Addendum)
Valium take as needed We will call with lab results  Look at the vertigo Youtube videos  F/U pending results

## 2019-04-13 ENCOUNTER — Encounter: Payer: Self-pay | Admitting: *Deleted

## 2019-04-13 LAB — COMPREHENSIVE METABOLIC PANEL
AG Ratio: 1.5 (calc) (ref 1.0–2.5)
ALT: 19 U/L (ref 6–29)
AST: 18 U/L (ref 10–30)
Albumin: 4.4 g/dL (ref 3.6–5.1)
Alkaline phosphatase (APISO): 71 U/L (ref 31–125)
BUN: 10 mg/dL (ref 7–25)
CO2: 24 mmol/L (ref 20–32)
Calcium: 9.5 mg/dL (ref 8.6–10.2)
Chloride: 105 mmol/L (ref 98–110)
Creat: 0.92 mg/dL (ref 0.50–1.10)
Globulin: 2.9 g/dL (calc) (ref 1.9–3.7)
Glucose, Bld: 83 mg/dL (ref 65–99)
Potassium: 4.4 mmol/L (ref 3.5–5.3)
Sodium: 139 mmol/L (ref 135–146)
Total Bilirubin: 0.5 mg/dL (ref 0.2–1.2)
Total Protein: 7.3 g/dL (ref 6.1–8.1)

## 2019-04-13 LAB — TSH: TSH: 1.27 mIU/L

## 2019-04-13 LAB — LIPID PANEL
Cholesterol: 235 mg/dL — ABNORMAL HIGH (ref <200)
HDL: 51 mg/dL (ref >=50)
LDL Cholesterol (Calc): 150 mg/dL (calc) — ABNORMAL HIGH
Non-HDL Cholesterol (Calc): 184 mg/dL (calc) — ABNORMAL HIGH (ref <130)
Total CHOL/HDL Ratio: 4.6 (calc) (ref <5.0)
Triglycerides: 200 mg/dL — ABNORMAL HIGH (ref <150)

## 2019-04-13 LAB — CBC WITH DIFFERENTIAL/PLATELET
Absolute Monocytes: 423 cells/uL (ref 200–950)
Basophils Absolute: 46 cells/uL (ref 0–200)
Basophils Relative: 0.5 %
Eosinophils Absolute: 166 cells/uL (ref 15–500)
Eosinophils Relative: 1.8 %
HCT: 40.1 % (ref 35.0–45.0)
Hemoglobin: 13.7 g/dL (ref 11.7–15.5)
Lymphs Abs: 3386 cells/uL (ref 850–3900)
MCH: 28 pg (ref 27.0–33.0)
MCHC: 34.2 g/dL (ref 32.0–36.0)
MCV: 82 fL (ref 80.0–100.0)
MPV: 11.6 fL (ref 7.5–12.5)
Monocytes Relative: 4.6 %
Neutro Abs: 5180 cells/uL (ref 1500–7800)
Neutrophils Relative %: 56.3 %
Platelets: 227 10*3/uL (ref 140–400)
RBC: 4.89 10*6/uL (ref 3.80–5.10)
RDW: 12.9 % (ref 11.0–15.0)
Total Lymphocyte: 36.8 %
WBC: 9.2 10*3/uL (ref 3.8–10.8)

## 2019-05-11 ENCOUNTER — Telehealth: Payer: Self-pay | Admitting: *Deleted

## 2019-05-11 NOTE — Telephone Encounter (Signed)
Received call from patient [phone]~ telephone.   Reports that she discussed changes in diet and exercise with hormone replacement. States that she was advised to follow up if this was not effective and she could discuss Phentermine again.   Appointment scheduled for telephealth visit as patient does not want to come in to office at this time.

## 2019-05-11 NOTE — Telephone Encounter (Signed)
noted 

## 2019-05-13 ENCOUNTER — Encounter: Payer: BC Managed Care – PPO | Admitting: Family Medicine

## 2019-05-16 ENCOUNTER — Other Ambulatory Visit: Payer: Self-pay

## 2019-05-16 ENCOUNTER — Encounter: Payer: Self-pay | Admitting: Family Medicine

## 2019-05-16 ENCOUNTER — Ambulatory Visit (INDEPENDENT_AMBULATORY_CARE_PROVIDER_SITE_OTHER): Payer: BC Managed Care – PPO | Admitting: Family Medicine

## 2019-05-16 DIAGNOSIS — E669 Obesity, unspecified: Secondary | ICD-10-CM | POA: Diagnosis not present

## 2019-05-16 MED ORDER — PHENTERMINE HCL 37.5 MG PO TABS: 37.5000 mg | ORAL_TABLET | Freq: Every day | ORAL | 1 refills | Status: DC

## 2019-05-16 MED ORDER — ESTRADIOL 0.5 MG PO TABS: 0.5000 mg | ORAL_TABLET | Freq: Every day | ORAL | Status: DC

## 2019-05-16 NOTE — Patient Instructions (Signed)
F/U 2 months  

## 2019-05-16 NOTE — Progress Notes (Signed)
Virtual Visit via Telephone Note  I connected with Sandra Parker on 05/16/19 at 3:34pm  by telephone and verified that I am speaking with the correct person using two identifiers.     Pt location: at home   Physician location:  In office, [facility], Vic Blackbird MD     On call: patient and physician   I discussed the limitations, risks, security and privacy concerns of performing an evaluation and management service by telephone and the availability of in person appointments. I also discussed with the patient that there may be a patient responsible charge related to this service. The patient expressed understanding and agreed to proceed.   History of Present Illness: She is concerned about her weight Drinking a gallon of water a day  She has been tracking her steps at work, but only 2-3 pounds in the past couple of months  24 hour recall- Breakfast apple Cinnomon oatmeal, had salad grilled , Dinner had pork chops, green beans, she cut out bread, using air fryer , eating veggie straws, eating 1200 calories   Home weight 160lbs  She is still taking estradiol which has helped her hot flashes but is not helped with the hormonal imbalance with regards to her weight.  She would like to try going back on phentermine she was last on this a few years ago.  Observations/Objective:  No acute distress noted over the phone  Assessment and Plan: Obesity-we will start her on phentermine discussed the side effects of the medication.  She has been on this before.  Will take first thing in the morning.  Will start with half a tablet for 1 week then take the full tablet.  She is to call for any concerning side effects.  Follow-up in the office in 2 months for weight check and blood pressure check.  Follow Up Instructions: F/U 2 months     I discussed the assessment and treatment plan with the patient. The patient was provided an opportunity to ask questions and all were answered. The  patient agreed with the plan and demonstrated an understanding of the instructions.   The patient was advised to call back or seek an in-person evaluation if the symptoms worsen or if the condition fails to improve as anticipated.  I provided 8 minutes of non-face-to-face time during this encounter. End time: 3:42pm  Vic Blackbird, MD

## 2019-06-10 ENCOUNTER — Telehealth: Payer: Self-pay | Admitting: *Deleted

## 2019-06-10 NOTE — Telephone Encounter (Signed)
Received call from patient. [phone]~ telephone.   Reports that she is employed by [employer] as Scientist, clinical (histocompatibility and immunogenetics). States that courthouses in [place] and [place] have been closed due to [place] + persons. States that she may have been in contact with them, but due to HIPPA, she is unsure of who was actually positive.     States that she had CPE scheduled, but has had to cancel due to COVID precautions. Requested letter from PCP for HR that states she had CPE scheduled, but had to be postponed.    AttnIzora Gala, Administration [phone]~ fax

## 2019-06-10 NOTE — Telephone Encounter (Signed)
Okay to write letter 

## 2019-06-10 NOTE — Telephone Encounter (Signed)
Letter transcribed and sent to patient via [place].

## 2019-06-14 ENCOUNTER — Encounter: Payer: BC Managed Care – PPO | Admitting: Family Medicine

## 2019-07-04 ENCOUNTER — Telehealth: Payer: Self-pay | Admitting: Family Medicine

## 2019-07-04 ENCOUNTER — Other Ambulatory Visit: Payer: Self-pay | Admitting: *Deleted

## 2019-07-04 MED ORDER — VALACYCLOVIR HCL 1 G PO TABS: 1000.0000 mg | ORAL_TABLET | Freq: Two times a day (BID) | ORAL | 2 refills | Status: DC

## 2019-07-04 NOTE — Telephone Encounter (Signed)
Patient needs refill on her valtrex for her fever blisters  [employer]

## 2019-07-04 NOTE — Telephone Encounter (Signed)
Prescription sent to pharmacy.

## 2019-07-19 ENCOUNTER — Other Ambulatory Visit: Payer: Self-pay

## 2019-07-20 ENCOUNTER — Ambulatory Visit: Payer: BC Managed Care – PPO | Admitting: Family Medicine

## 2019-07-20 ENCOUNTER — Encounter: Payer: Self-pay | Admitting: Family Medicine

## 2019-07-20 VITALS — BP 124/60 | HR 84 | Temp 98.4°F | Resp 14 | Ht 61.0 in | Wt 153.8 lb

## 2019-07-20 DIAGNOSIS — M542 Cervicalgia: Secondary | ICD-10-CM

## 2019-07-20 DIAGNOSIS — E663 Overweight: Secondary | ICD-10-CM | POA: Diagnosis not present

## 2019-07-20 MED ORDER — PHENTERMINE HCL 37.5 MG PO TABS: 37.5000 mg | ORAL_TABLET | Freq: Every day | ORAL | 2 refills | Status: DC

## 2019-07-20 NOTE — Progress Notes (Signed)
   Subjective:    Patient ID: Sandra Parker, female    DOB: 01-Sep-1976, 43 y.o.   MRN: [id number]  Patient presents for Follow-up and Neck Pain (knot to back of neck with pain radiating down spine)   Pt here to f/u chronic medical problems    Noticed knot at base of neck last week, it was sore to touch, has now gone down some. No injury to neck. , has soreness when moving it around, also had left forearm pain last week. Took ibuprofen and hydrocodone for the pain. Used icy hot patches   Obesity- weight down 10lbs since April, she is counting calories during, drinking almost a gallon of water a day has a water jug that helps her during the day , has apple watch device to track exercise  She has been walking for exercise, walks 60-106minute   GERD- taking protonix    GAD- uses xanax as needed not daily  Review Of Systems:  GEN- denies fatigue, fever, weight loss,weakness, recent illness HEENT- denies eye drainage, change in vision, nasal discharge, CVS- denies chest pain, palpitations RESP- denies SOB, cough, wheeze ABD- denies N/V, change in stools, abd pain GU- denies dysuria, hematuria, dribbling, incontinence MSK- + joint pain, muscle aches, injury Neuro- denies headache, dizziness, syncope, seizure activity       Objective:    BP 124/60   Pulse 84   Temp 98.4 F (36.9 C) (Oral)   Resp 14   Ht 5\' 1"  (1.549 m)   Wt 153 lb 12.8 oz (69.8 kg)   SpO2 98%   BMI 29.06 kg/m  GEN- NAD, alert and oriented x3 HEENT- PERRL, EOMI, non injected sclera, pink conjunctiva, MMM, oropharynx clear Neck- Supple, no thyromegaly, decreased ROM with rotation, C spine NT, mild swelling over trapzius CVS- RRR, no murmur RESP-CTAB msk- FROM shoulders, neg empty can EXT- No edema Pulses- Radial 2+        Assessment & Plan:      Problem List Items Addressed This Visit      Unprioritized   Overweight (BMI 25.0-29.9) - Primary    BMI now in overweight category instead of  obese Continue with exercise Low carb/calories and phentermine Recheck weight in a few months  No SE with meds       Other Visit Diagnoses    Neck pain       More MSK pain, spasm, advised topical head, massage or chiropracter, no red flags, would not use muscle relaxer at this time with othr meds.She works at [employer] over most of day      Note: This dictation was prepared with Sales executive along with smaller Company secretary. Any transcriptional errors that result from this process are unintentional.

## 2019-07-20 NOTE — Patient Instructions (Signed)
F/U  3 months 

## 2019-07-21 ENCOUNTER — Encounter: Payer: Self-pay | Admitting: Family Medicine

## 2019-07-21 NOTE — Assessment & Plan Note (Signed)
BMI now in overweight category instead of obese Continue with exercise Low carb/calories and phentermine Recheck weight in a few months  No SE with meds

## 2019-07-26 ENCOUNTER — Other Ambulatory Visit: Payer: Self-pay

## 2019-07-26 ENCOUNTER — Encounter: Payer: Self-pay | Admitting: Family Medicine

## 2019-07-26 ENCOUNTER — Ambulatory Visit (INDEPENDENT_AMBULATORY_CARE_PROVIDER_SITE_OTHER): Payer: BC Managed Care – PPO | Admitting: Family Medicine

## 2019-07-26 ENCOUNTER — Other Ambulatory Visit: Payer: BC Managed Care – PPO

## 2019-07-26 DIAGNOSIS — J069 Acute upper respiratory infection, unspecified: Secondary | ICD-10-CM | POA: Diagnosis not present

## 2019-07-26 DIAGNOSIS — Z20828 Contact with and (suspected) exposure to other viral communicable diseases: Secondary | ICD-10-CM

## 2019-07-26 DIAGNOSIS — Z20822 Contact with and (suspected) exposure to covid-19: Secondary | ICD-10-CM

## 2019-07-26 DIAGNOSIS — R51 Headache: Secondary | ICD-10-CM

## 2019-07-26 DIAGNOSIS — R519 Headache, unspecified: Secondary | ICD-10-CM

## 2019-07-26 MED ORDER — BENZONATATE 100 MG PO CAPS: 100.0000 mg | ORAL_CAPSULE | Freq: Three times a day (TID) | ORAL | 0 refills | Status: DC | PRN

## 2019-07-26 MED ORDER — PSEUDOEPHEDRINE HCL 30 MG PO TABS: 30.0000 mg | ORAL_TABLET | ORAL | 0 refills | Status: DC | PRN

## 2019-07-26 MED ORDER — DOXYCYCLINE HYCLATE 100 MG PO TABS: 100.0000 mg | ORAL_TABLET | Freq: Two times a day (BID) | ORAL | 0 refills | Status: AC

## 2019-07-26 MED ORDER — FLUTICASONE PROPIONATE 50 MCG/ACT NA SUSP: 2.0000 | Freq: Every day | NASAL | 2 refills | Status: DC

## 2019-07-26 MED ORDER — LEVOCETIRIZINE DIHYDROCHLORIDE 5 MG PO TABS: 5.0000 mg | ORAL_TABLET | Freq: Every evening | ORAL | 1 refills | Status: DC

## 2019-07-26 NOTE — Progress Notes (Signed)
Patient ID: Sandra Parker, female    DOB: 1976-11-09, 43 y.o.   MRN: [id number]  PCP: Alycia Rossetti, MD  Virtual Visit via telephone  Phone visit arranged with Blair Hailey for 07/26/19 at  9:30 AM EDT  Services provided today were via telemedicine through telephone call. Start of phone call:  10:37 AM  I verified that I was speaking with the correct person using two identifiers. Patient reported their location during encounter was at home   Patient consented to telephone visit  I conducted telephone visit from [place] [facility]  Referring Provider:   Alycia Rossetti, MD   All participants in encounter:  Myself and the patient   I discussed the limitations, risks, security and privacy concerns of performing an evaluation and management service by telephone and the availability of in person appointments. I also discussed with the patient that there may be a patient responsible charge related to this service. The patient expressed understanding and agreed to proceed.  No chief complaint on file.   Subjective:   Sandra Parker is a 43 y.o. female, present for telephone visit for sinus congestion, drainage, headache, scratchy throat.  Sx started last Friday.  [facility]  "real back HA" on the sides of eye, eyes kinda puffy, feels like pressure.  Mucinex (day and night tablet) doesn't seem to be helping at all.  Also tried robitussin.   100.5 fever was yesterday, none today No body aches Cough dry, non-productive  No GI sx,   Works at [employer], "lots of positives at work" and she was contacted by her local health   Employees she works directly with tested on 22nd, 10 positive cases.  Work note?  Yes     HPI    Patient Active Problem List   Diagnosis Date Noted  . Acne vulgaris 08/28/2015  . Insomnia 04/24/2015  . Large breasts 09/12/2014  . Mid back pain 09/12/2014  . Tendinitis of forearm 08/28/2014  . Surgical menopause  on hormone replacement therapy 08/28/2014  . Migraine headache 06/17/2014  . Rotator cuff syndrome 04/28/2014  . Overweight (BMI 25.0-29.9) 07/20/2013  . Pain in joint, shoulder region 07/20/2013  . Vertigo 06/19/2013  . Anxiety   . Depression     Prior to Admission medications   Medication Sig Start Date End Date Taking? Authorizing Provider  ALPRAZolam Duanne Moron) 0.5 MG tablet TAKE (1) TABLET BY MOUTH TWICE DAILY AS NEEDED. 11/24/18   Alycia Rossetti, MD  diazepam (VALIUM) 2 MG tablet Take 1 tablet (2 mg total) by mouth 2 (two) times daily as needed (Vertigo). 04/12/19   Grant, Modena Nunnery, MD  estradiol (ESTRACE) 0.5 MG tablet Take 1 tablet (0.5 mg total) by mouth daily. 05/16/19   , Modena Nunnery, MD  HYDROcodone-acetaminophen (NORCO/VICODIN) 5-325 MG tablet TAKE 1 TABLET BY MOUTH EVERY 6 HOURS AS NEEDED FOR MODERATE PAIN. 11/24/18   Alycia Rossetti, MD  pantoprazole (PROTONIX) 40 MG tablet Take 1 tablet (40 mg total) by mouth daily. 10/22/18   Susy Frizzle, MD  phentermine (ADIPEX-P) 37.5 MG tablet Take 1 tablet (37.5 mg total) by mouth daily before breakfast. 07/20/19   Alycia Rossetti, MD  valACYclovir (VALTREX) 1000 MG tablet Take 1 tablet (1,000 mg total) by mouth 2 (two) times daily. X 1 day 07/04/19   Alycia Rossetti, MD    Allergies  Allergen Reactions  . Prozac [Fluoxetine] Other (See Comments)  HEADACHES  . Wellbutrin [Bupropion] Other (See Comments)    NIGHTMARES  . Cephalexin Rash  . Ciprofloxacin Rash  . Relafen [Nabumetone] Other (See Comments)    UNKNOWN    Review of Systems  Constitutional: Negative.   HENT: Negative.   Eyes: Negative.   Respiratory: Negative.   Cardiovascular: Negative.   Gastrointestinal: Negative.   Endocrine: Negative.   Genitourinary: Negative.   Musculoskeletal: Negative.   Skin: Negative.   Allergic/Immunologic: Negative.   Neurological: Negative.   Hematological: Negative.   Psychiatric/Behavioral: Negative.   All  other systems reviewed and are negative.      Objective:    There were no vitals filed for this visit.    Physical Exam  Limited due to telephone encounter Scratchy/raspy phonation, able to speak in complete short sentences, intermittent coughing, no audible wheeze or stridor, pt alert, answering questions appropriately     Assessment & Plan:     ICD-10-CM   1. Exposure to Covid-19 Virus  Z20.828 Novel Coronavirus, NAA ([facility])   exposure to multiple COVID + coworkers, [facility] neg, sx c/w COVID, retest due to high risk exposure and sx  2. Upper respiratory tract infection, unspecified type  J06.9 Novel Coronavirus, NAA ([facility])   symptomatic and supportive tx, abx on hold for several days in case of acute bacterial sinusitis sx develop, but favor viral illness  3. Acute nonintractable headache, unspecified headache type  R51 Novel Coronavirus, NAA ([facility])   tx with fluids, tylenol/ibuprofen, f/up if not improving, does not sound like ABS right now, likely secondary to fever/viral illness     I discussed the assessment and treatment plan with the patient. The patient was provided an opportunity to ask questions and all were answered. The patient agreed with the plan and demonstrated an understanding of the instructions.   The patient was advised to call back or seek an in-person evaluation if the symptoms worsen or if the condition fails to improve as anticipated.  Phone call concluded at 10:19 am I provided 20 minutes of non-face-to-face time during this encounter.  Delsa Grana, PA-C 07/26/19 10:37 AM

## 2019-07-26 NOTE — Patient Instructions (Signed)
Start taking tylenol 650 -1000 mg every 6-8 hours and ibuprofen 400-600 mg three times a day as needed for the headache symptoms, pain and/or fever.  Push fluids!  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  For cough you can continue to try mucinex, robitussin and I sent a prescription cough suppressant to the pharmacy as well.   I think the coughing will improve if we can get the post nasal drip to improve.  It sounds viral in nature - which usually runs its course and treatment is supportive and symptomatic.    I would go get tested again due to your high risk exposure at work, stay out of work until at least Friday - following the CDC guidelines of isolating with illness (see below)  If your second test comes back positive we would return you to work according to the day your symptoms started and also you would have to be fever free and have improving symptoms for several days.     I sent antibiotics to the pharmacy - to be put on HOLD - and would only start them if you have a sudden worsening of symptoms with fever, with facial pain and tenderness to cheeks and forehead, or pain and pressure severe in your face, behind eyes and/or to teeth.  You may also start antibiotics if symptoms go on for more than 8-10 days.    If you start the antibiotics you have to STOP the SUDAFED.    Antibiotics do not treat viruses and talking with you today, I do not believe antibiotics are indicated today.       This information is directly available on the CDC website: RunningShows.co.za.html    Source:CDC Reference to specific commercial products, manufacturers, companies, or trademarks does not constitute its endorsement or recommendation by the [place], [place], or Centers for [employer] and Prevention.

## 2019-07-28 LAB — NOVEL CORONAVIRUS, NAA: SARS-CoV-2, NAA: NOT DETECTED

## 2019-08-15 ENCOUNTER — Other Ambulatory Visit: Payer: Self-pay | Admitting: Family Medicine

## 2019-08-24 ENCOUNTER — Encounter: Payer: Self-pay | Admitting: Family Medicine

## 2019-08-24 ENCOUNTER — Ambulatory Visit: Payer: BC Managed Care – PPO | Admitting: Family Medicine

## 2019-08-24 VITALS — BP 122/66 | HR 80 | Temp 98.2°F | Resp 14 | Ht 61.0 in | Wt 154.0 lb

## 2019-08-24 DIAGNOSIS — R2232 Localized swelling, mass and lump, left upper limb: Secondary | ICD-10-CM | POA: Diagnosis not present

## 2019-08-24 NOTE — Patient Instructions (Addendum)
Referral for ultrasound of left forearm  F/U pending results

## 2019-08-24 NOTE — Progress Notes (Signed)
   Subjective:    Patient ID: Sandra Parker, female    DOB: [birth year], 43 y.o.   MRN: [id number]  Patient presents for L Arm Pain (states that she has knot like area to L arm with pain- reports that knot will go away and then come back)  Pt here with knot on left forear,, knot pops up then goes down. Has an aching due t the pain, she has pain when she tries to close the fist or grasp  Had   No redness or drainage, feelsunder the skin    Right hand dominant   Review Of Systems:  GEN- denies fatigue, fever, weight loss,weakness, recent illness HEENT- denies eye drainage, change in vision, nasal discharge, CVS- denies chest pain, palpitations RESP- denies SOB, cough, wheeze ABD- denies N/V, change in stools, abd pain GU- denies dysuria, hematuria, dribbling, incontinence MSK- denies joint pain, muscle aches, injury Neuro- denies headache, dizziness, syncope, seizure activity       Objective:    BP 122/66   Pulse 80   Temp 98.2 F (36.8 C) (Oral)   Resp 14   Ht 5\' 1"  (1.549 m)   Wt 154 lb (69.9 kg)   SpO2 99%   BMI 29.10 kg/m  GEN- NAD, alert and oriented x3 CVS- RRR, no murmur RESP-CTAB Ext- left forearm 1-2 inches below lateral olecranon small subcutaneous swelling palpated, no erythema, no fluctance nothing noticeable on surfrace, TTP        Assessment & Plan:      Problem List Items Addressed This Visit    None    Visit Diagnoses    Subcutaneous mass of left forearm    -  Primary   obtain US of forearm, DD Cyst, Lipoma , treatment pending evaluation      Note: This dictation was prepared with Dragon dictation along with smaller phrase technology. Any transcriptional errors that result from this process are unintentional.

## 2019-08-25 ENCOUNTER — Encounter: Payer: Self-pay | Admitting: Family Medicine

## 2019-08-26 ENCOUNTER — Ambulatory Visit: Payer: BC Managed Care – PPO | Admitting: Family Medicine

## 2019-08-31 ENCOUNTER — Ambulatory Visit (HOSPITAL_COMMUNITY): Payer: BC Managed Care – PPO

## 2019-09-02 ENCOUNTER — Ambulatory Visit (HOSPITAL_COMMUNITY)
Admission: RE | Admit: 2019-09-02 | Discharge: 2019-09-02 | Disposition: A | Discharge status: Home / Self Care | Payer: BC Managed Care – PPO | Source: Ambulatory Visit | Attending: Family Medicine | Admitting: Family Medicine

## 2019-09-02 ENCOUNTER — Other Ambulatory Visit: Payer: Self-pay

## 2019-09-02 DIAGNOSIS — R2232 Localized swelling, mass and lump, left upper limb: Secondary | ICD-10-CM | POA: Insufficient documentation

## 2019-09-16 ENCOUNTER — Other Ambulatory Visit: Payer: Self-pay | Admitting: Family Medicine

## 2019-10-04 ENCOUNTER — Telehealth: Payer: Self-pay | Admitting: Family Medicine

## 2019-10-04 ENCOUNTER — Other Ambulatory Visit: Payer: Self-pay | Admitting: Family Medicine

## 2019-10-04 DIAGNOSIS — Z1231 Encounter for screening mammogram for malignant neoplasm of breast: Secondary | ICD-10-CM

## 2019-10-04 NOTE — Telephone Encounter (Signed)
FYI:   Patient called asking if in her note from July's visit with Dr. Buelah Manis if it was documented that she had arm pain.  She states that she had mentioned to Dr. Buelah Manis and she states her secondary insurance is asking if it was documented.  I looked in her chart and saw that she had mentioned it to Dr. Buelah Manis she is going to call for note to be faxed to her in the morning.

## 2019-10-04 NOTE — Telephone Encounter (Signed)
Noted mention in chart from 07/26/2019.  Chart notes printed for patient .

## 2019-10-05 ENCOUNTER — Emergency Department (HOSPITAL_COMMUNITY): Payer: BC Managed Care – PPO

## 2019-10-05 ENCOUNTER — Encounter (HOSPITAL_COMMUNITY): Payer: Self-pay | Admitting: Emergency Medicine

## 2019-10-05 ENCOUNTER — Emergency Department (HOSPITAL_COMMUNITY)
Admission: EM | Admit: 2019-10-05 | Discharge: 2019-10-05 | Disposition: A | Discharge status: Home / Self Care | Payer: BC Managed Care – PPO | Attending: Emergency Medicine | Admitting: Emergency Medicine

## 2019-10-05 DIAGNOSIS — Y939 Activity, unspecified: Secondary | ICD-10-CM | POA: Insufficient documentation

## 2019-10-05 DIAGNOSIS — Z79899 Other long term (current) drug therapy: Secondary | ICD-10-CM | POA: Diagnosis not present

## 2019-10-05 DIAGNOSIS — M7918 Myalgia, other site: Secondary | ICD-10-CM

## 2019-10-05 DIAGNOSIS — M25552 Pain in left hip: Secondary | ICD-10-CM | POA: Insufficient documentation

## 2019-10-05 DIAGNOSIS — S50812A Abrasion of left forearm, initial encounter: Secondary | ICD-10-CM | POA: Diagnosis not present

## 2019-10-05 DIAGNOSIS — Y999 Unspecified external cause status: Secondary | ICD-10-CM | POA: Diagnosis not present

## 2019-10-05 DIAGNOSIS — S50819A Abrasion of unspecified forearm, initial encounter: Secondary | ICD-10-CM

## 2019-10-05 DIAGNOSIS — S63502A Unspecified sprain of left wrist, initial encounter: Secondary | ICD-10-CM | POA: Diagnosis not present

## 2019-10-05 DIAGNOSIS — Y929 Unspecified place or not applicable: Secondary | ICD-10-CM | POA: Diagnosis not present

## 2019-10-05 DIAGNOSIS — S59912A Unspecified injury of left forearm, initial encounter: Secondary | ICD-10-CM | POA: Diagnosis present

## 2019-10-05 MED ORDER — OXYCODONE-ACETAMINOPHEN 5-325 MG PO TABS
1.0000 | ORAL_TABLET | Freq: Once | ORAL | Status: AC
  Administered 2019-10-05: 1 via ORAL
  Filled 2019-10-05: qty 1

## 2019-10-05 MED ORDER — BACITRACIN ZINC 500 UNIT/GM EX OINT
TOPICAL_OINTMENT | Freq: Two times a day (BID) | CUTANEOUS | Status: DC
  Administered 2019-10-05: 20:00:00 via TOPICAL

## 2019-10-05 MED ORDER — CYCLOBENZAPRINE HCL 10 MG PO TABS: 10.0000 mg | ORAL_TABLET | Freq: Two times a day (BID) | ORAL | 0 refills | Status: DC | PRN

## 2019-10-05 NOTE — Discharge Instructions (Signed)
Take Motrin and Tylenol as needed structured for pain.  Take Flexeril as needed for muscle spasm and tightness.  Do not drive or operate machinery if taking Flexeril. Apply ice to your left hand and wrist for 20 to 30 minutes at a time 3 times a day, elevate if needed for pain and swelling.  Apply warm compresses to sore achy muscles. Recommend recheck with your primary care provider later this week, return to ER for new or worsening symptoms.
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
## 2019-10-05 NOTE — ED Notes (Signed)
Discharge instructions discussed with pt. Pt verbalized understanding. Pt stable and ambulatory. No signature pad available. 
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
## 2019-10-10 ENCOUNTER — Other Ambulatory Visit: Payer: Self-pay

## 2019-10-10 ENCOUNTER — Encounter: Payer: Self-pay | Admitting: Family Medicine

## 2019-10-10 ENCOUNTER — Ambulatory Visit: Payer: BC Managed Care – PPO | Admitting: Family Medicine

## 2019-10-10 DIAGNOSIS — S5012XD Contusion of left forearm, subsequent encounter: Secondary | ICD-10-CM

## 2019-10-10 DIAGNOSIS — M25552 Pain in left hip: Secondary | ICD-10-CM

## 2019-10-10 DIAGNOSIS — M545 Low back pain, unspecified: Secondary | ICD-10-CM

## 2019-10-10 DIAGNOSIS — M25532 Pain in left wrist: Secondary | ICD-10-CM | POA: Diagnosis not present

## 2019-10-10 DIAGNOSIS — S134XXD Sprain of ligaments of cervical spine, subsequent encounter: Secondary | ICD-10-CM

## 2019-10-10 MED ORDER — CYCLOBENZAPRINE HCL 10 MG PO TABS: 10.0000 mg | ORAL_TABLET | Freq: Two times a day (BID) | ORAL | 0 refills | Status: DC | PRN

## 2019-10-10 MED ORDER — HYDROCODONE-ACETAMINOPHEN 5-325 MG PO TABS: ORAL_TABLET | ORAL | 0 refills | Status: DC

## 2019-10-10 NOTE — Patient Instructions (Signed)
F/U 3 weeks
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
## 2019-10-30 ENCOUNTER — Other Ambulatory Visit: Payer: Self-pay | Admitting: Family Medicine

## 2019-10-31 ENCOUNTER — Ambulatory Visit: Payer: BC Managed Care – PPO | Admitting: Family Medicine

## 2019-11-09 ENCOUNTER — Other Ambulatory Visit: Payer: Self-pay

## 2019-11-09 ENCOUNTER — Encounter: Payer: Self-pay | Admitting: Family Medicine

## 2019-11-09 ENCOUNTER — Ambulatory Visit: Payer: BC Managed Care – PPO | Admitting: Family Medicine

## 2019-11-09 DIAGNOSIS — M545 Low back pain, unspecified: Secondary | ICD-10-CM

## 2019-11-09 DIAGNOSIS — S5012XD Contusion of left forearm, subsequent encounter: Secondary | ICD-10-CM

## 2019-11-09 DIAGNOSIS — S134XXD Sprain of ligaments of cervical spine, subsequent encounter: Secondary | ICD-10-CM | POA: Diagnosis not present
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
## 2019-11-09 NOTE — Patient Instructions (Signed)
FU AS NEEDED  

## 2019-11-13 ENCOUNTER — Other Ambulatory Visit: Payer: Self-pay | Admitting: Family Medicine

## 2019-11-17 ENCOUNTER — Ambulatory Visit: Payer: BC Managed Care – PPO

## 2019-11-23 ENCOUNTER — Other Ambulatory Visit (HOSPITAL_COMMUNITY): Payer: Self-pay | Admitting: Family Medicine

## 2019-11-23 DIAGNOSIS — Z1231 Encounter for screening mammogram for malignant neoplasm of breast: Secondary | ICD-10-CM

## 2019-11-28 ENCOUNTER — Ambulatory Visit (HOSPITAL_COMMUNITY)
Admission: RE | Admit: 2019-11-28 | Discharge: 2019-11-28 | Disposition: A | Discharge status: Home / Self Care | Payer: BC Managed Care – PPO | Source: Ambulatory Visit | Attending: Family Medicine | Admitting: Family Medicine

## 2019-11-28 ENCOUNTER — Encounter (HOSPITAL_COMMUNITY): Payer: Self-pay

## 2019-11-28 ENCOUNTER — Other Ambulatory Visit: Payer: Self-pay

## 2019-11-28 DIAGNOSIS — Z1231 Encounter for screening mammogram for malignant neoplasm of breast: Secondary | ICD-10-CM | POA: Diagnosis present

## 2019-12-12 ENCOUNTER — Ambulatory Visit (HOSPITAL_COMMUNITY): Payer: BC Managed Care – PPO

## 2019-12-19 ENCOUNTER — Ambulatory Visit (INDEPENDENT_AMBULATORY_CARE_PROVIDER_SITE_OTHER): Payer: BC Managed Care – PPO | Admitting: Family Medicine

## 2019-12-19 ENCOUNTER — Other Ambulatory Visit: Payer: Self-pay

## 2019-12-19 DIAGNOSIS — Z20828 Contact with and (suspected) exposure to other viral communicable diseases: Secondary | ICD-10-CM

## 2019-12-19 DIAGNOSIS — Z20822 Contact with and (suspected) exposure to covid-19: Secondary | ICD-10-CM

## 2019-12-19 DIAGNOSIS — R05 Cough: Secondary | ICD-10-CM | POA: Diagnosis not present

## 2019-12-19 DIAGNOSIS — R058 Other specified cough: Secondary | ICD-10-CM

## 2019-12-19 NOTE — Progress Notes (Signed)
Subjective:    Patient ID: Sandra Parker, female    DOB: 06/01/1976, 43 y.o.   MRN: [id number]  HPI  Patient is being seen today as a telephone visit.  Phone call began at 155.  Phone call concluded at 203.  Patient consents to be seen over the telephone.  She was exposed to COVID-19 through a coworker 1 week ago.  She work very closely with him up until last Monday.  This Saturday, the patient developed symptoms including cough, chest congestion, head congestion, rhinorrhea, and a sore throat.  She denies any loss in her sense of taste or smell.  She denies any fever.  She denies any shortness of breath however she sounds very congested today over the telephone.  She is calling questioning what she should do next. Past Medical History:  Diagnosis Date  . Anxiety   . Depression   . GERD (gastroesophageal reflux disease)   . Macromastia 10/2018  . TMJ (temporomandibular joint syndrome)    Past Surgical History:  Procedure Laterality Date  . APPENDECTOMY    . BREAST REDUCTION SURGERY Bilateral 11/22/2018   Procedure: BREAST REDUCTION WITH LIPOSUCTION;  Surgeon: Cristine Polio, MD;  Location: [facility];  Service: Plastics;  Laterality: Bilateral;  Bilateral   . HYSTERECTOMY ABDOMINAL WITH SALPINGO-OOPHORECTOMY Left 04/15/2002  . HYSTEROSCOPY WITH D & C  08/02/2001  . LAPAROSCOPIC CHOLECYSTECTOMY  08/16/2010  . REDUCTION MAMMAPLASTY Bilateral    bilateral breast reduction nov 2019   Current Outpatient Medications on File Prior to Visit  Medication Sig Dispense Refill  . ALPRAZolam (XANAX) 0.5 MG tablet TAKE (1) TABLET BY MOUTH TWICE DAILY AS NEEDED. 60 tablet 0  . cyclobenzaprine (FLEXERIL) 10 MG tablet Take 1 tablet (10 mg total) by mouth 2 (two) times daily as needed for muscle spasms. 30 tablet 0  . diazepam (VALIUM) 2 MG tablet Take 1 tablet (2 mg total) by mouth 2 (two) times daily as needed (Vertigo). 30 tablet 0  . estradiol (ESTRACE) 0.5 MG tablet TAKE 1 TABLET  BY MOUTH DAILY. 30 tablet 3  . HYDROcodone-acetaminophen (NORCO/VICODIN) 5-325 MG tablet TAKE 1 TABLET BY MOUTH EVERY 6 HOURS AS NEEDED FOR MODERATE PAIN. 30 tablet 0  . pantoprazole (PROTONIX) 40 MG tablet TAKE 1 TABLET BY MOUTH DAILY 90 tablet 1  . valACYclovir (VALTREX) 1000 MG tablet Take 1 tablet (1,000 mg total) by mouth 2 (two) times daily. X 1 day 20 tablet 2   No current facility-administered medications on file prior to visit.   Allergies  Allergen Reactions  . Prozac [Fluoxetine] Other (See Comments)    HEADACHES  . Wellbutrin [Bupropion] Other (See Comments)    NIGHTMARES  . Cephalexin Rash  . Ciprofloxacin Rash  . Relafen [Nabumetone] Other (See Comments)    UNKNOWN   Social History   Socioeconomic History  . Marital status: Divorced    Spouse name: Not on file  . Number of children: Not on file  . Years of education: Not on file  . Highest education level: Not on file  Occupational History  . Not on file  Tobacco Use  . Smoking status: Never Smoker  . Smokeless tobacco: Never Used  Substance and Sexual Activity  . Alcohol use: Not Currently  . Drug use: No  . Sexual activity: Not on file  Other Topics Concern  . Not on file  Social History Narrative  . Not on file   Social Determinants of Health   Financial Resource Strain:   .  Difficulty of Paying Living Expenses: Not on file  Food Insecurity:   . Worried About Charity fundraiser in the Last Year: Not on file  . Ran Out of Food in the Last Year: Not on file  Transportation Needs:   . Lack of Transportation (Medical): Not on file  . Lack of Transportation (Non-Medical): Not on file  Physical Activity:   . Days of Exercise per Week: Not on file  . Minutes of Exercise per Session: Not on file  Stress:   . Feeling of Stress : Not on file  Social Connections:   . Frequency of Communication with Friends and Family: Not on file  . Frequency of Social Gatherings with Friends and Family: Not on file  .  Attends Religious Services: Not on file  . Active Member of Clubs or Organizations: Not on file  . Attends Archivist Meetings: Not on file  . Marital Status: Not on file  Intimate Partner Violence:   . Fear of Current or Ex-Partner: Not on file  . Emotionally Abused: Not on file  . Physically Abused: Not on file  . Sexually Abused: Not on file     Review of Systems  All other systems reviewed and are negative.      Objective:   Physical Exam  Physical exam could not be performed today as patient was seen over the telephone however she is not demonstrating any respiratory distress.  She is speaking in full and complete sentences with no increased work of breathing.  She does however sound audibly congested with rhinorrhea and an occasional cough.      Assessment & Plan:  Cough with exposure to COVID-19 virus  Patient clearly has a viral upper respiratory infection.  I feel that she is at high risk to have COVID-19.  I have recommended that she get tested immediately.  We discussed directions on how to arrange that through the contact number at the hospital.  I also recommended that she quarantine for 14 days from this last Saturday.  I recommended that she treat herself symptomatically with over-the-counter medication for fever and cough and congestion.  If she develops shortness of breath or chest pain she is to go immediately to the hospital.

## 2019-12-20 ENCOUNTER — Other Ambulatory Visit: Payer: BC Managed Care – PPO

## 2019-12-21 ENCOUNTER — Other Ambulatory Visit: Payer: Self-pay | Admitting: Family Medicine

## 2019-12-21 ENCOUNTER — Telehealth: Payer: Self-pay | Admitting: Family Medicine

## 2019-12-21 MED ORDER — HYDROCODONE-HOMATROPINE 5-1.5 MG/5ML PO SYRP: 5.0000 mL | ORAL_SOLUTION | Freq: Three times a day (TID) | ORAL | 0 refills | Status: DC | PRN

## 2019-12-21 NOTE — Telephone Encounter (Signed)
Patient calling to say she got her covid test back and was negative, however her cough is horrible and would like to know if something can be called in  [place]

## 2019-12-21 NOTE — Telephone Encounter (Signed)
Patient had telephone visit with MD. Please advise.

## 2019-12-26 ENCOUNTER — Other Ambulatory Visit: Payer: Self-pay | Admitting: Family Medicine

## 2019-12-26 NOTE — Telephone Encounter (Signed)
MD please advise

## 2019-12-26 NOTE — Telephone Encounter (Signed)
Hycodan was prescribed on 12/23.  This is the strongest cough medicine I have.  If she is worse (fever, sob, purulent sputum, chest pain), she needs to be seen.  If it is a cold (just a nagging irritant cough), it will take 7-10 days to resolve.

## 2019-12-26 NOTE — Telephone Encounter (Signed)
Patient says she has yet to receive a phone call back from the 23rd, and would still like a call back  As soon as possible [phone]

## 2019-12-26 NOTE — Telephone Encounter (Signed)
Call placed to patient and patient made aware.   States that she has not picked up cough syrup from 12/21/2019. Reports that she has cough that worsens at night, but during the day she has increased sinus pressure.   States that she also was re-tested for COIVD on 12/26/2019.

## 2020-01-31 ENCOUNTER — Ambulatory Visit (INDEPENDENT_AMBULATORY_CARE_PROVIDER_SITE_OTHER): Payer: BC Managed Care – PPO | Admitting: Family Medicine

## 2020-01-31 ENCOUNTER — Other Ambulatory Visit: Payer: Self-pay

## 2020-01-31 ENCOUNTER — Encounter: Payer: Self-pay | Admitting: Family Medicine

## 2020-01-31 DIAGNOSIS — Z20822 Contact with and (suspected) exposure to covid-19: Secondary | ICD-10-CM

## 2020-01-31 NOTE — Progress Notes (Signed)
Virtual Visit via Telephone Note  I connected with Sandra Parker on 01/31/20 at 12:24pm  by telephone and verified that I am speaking with the correct person using two identifiers.      Pt location: at home   Physician location:  In office, [facility], Vic Blackbird MD     On call: patient and physician   I discussed the limitations, risks, security and privacy concerns of performing an evaluation and management service by telephone and the availability of in person appointments. I also discussed with the patient that there may be a patient responsible charge related to this service. The patient expressed understanding and agreed to proceed.   History of Present Illness: Pt was sent home from work today due to respiratory symptoms. She didn't go to work yesterday either due to migraine headache. Sunday evening started with headache and some sinus drainage. Yesterday morning started with a little cough. Feels drainage down the back of her throat, throat raw. No fever. She has some aching in the lower part of her back worse than her typical.  No change in taste or smell No vomiting or diarrhea  She has been taking alka seltzer plus Headache improved this AM     Observations/Objective: NAD noted on phone, sounds congested  Assessment and Plan:  COVID 19 symptoms- send for testing she 3 days of viral symptoms.  She also works in a high risk area where there have been multiple cases on her job.  She can continue using the Tylenol cough and cold as this is helped.  She can also add in nasal steroid to help with the postnasal drip.  No antibiotics needed at this time.  She will quarantine until her results return.  I provided a letter for her job. Follow Up Instructions:    I discussed the assessment and treatment plan with the patient. The patient was provided an opportunity to ask questions and all were answered. The patient agreed with the plan and demonstrated an  understanding of the instructions.   The patient was advised to call back or seek an in-person evaluation if the symptoms worsen or if the condition fails to improve as anticipated.  I provided 8 minutes of non-face-to-face time during this encounter. End Time 12:32pm  Vic Blackbird, MD

## 2020-02-01 ENCOUNTER — Encounter: Payer: Self-pay | Admitting: Family Medicine

## 2020-02-02 ENCOUNTER — Other Ambulatory Visit: Payer: Self-pay

## 2020-02-02 ENCOUNTER — Ambulatory Visit (INDEPENDENT_AMBULATORY_CARE_PROVIDER_SITE_OTHER): Payer: BC Managed Care – PPO | Admitting: Family Medicine

## 2020-02-02 ENCOUNTER — Ambulatory Visit
Admission: EM | Admit: 2020-02-02 | Discharge: 2020-02-02 | Disposition: A | Discharge status: Home / Self Care | Payer: BC Managed Care – PPO | Attending: Emergency Medicine | Admitting: Emergency Medicine

## 2020-02-02 DIAGNOSIS — J069 Acute upper respiratory infection, unspecified: Secondary | ICD-10-CM

## 2020-02-02 DIAGNOSIS — J029 Acute pharyngitis, unspecified: Secondary | ICD-10-CM

## 2020-02-02 DIAGNOSIS — Z20822 Contact with and (suspected) exposure to covid-19: Secondary | ICD-10-CM | POA: Diagnosis present

## 2020-02-02 LAB — POCT RAPID STREP A (OFFICE): Rapid Strep A Screen: NEGATIVE

## 2020-02-02 MED ORDER — MENTHOL 3 MG MT LOZG: 1.0000 | LOZENGE | OROMUCOSAL | 12 refills | Status: DC | PRN

## 2020-02-02 MED ORDER — BENZONATATE 100 MG PO CAPS: 100.0000 mg | ORAL_CAPSULE | Freq: Three times a day (TID) | ORAL | 0 refills | Status: DC

## 2020-02-02 MED ORDER — FLUTICASONE PROPIONATE 50 MCG/ACT NA SUSP: 1.0000 | Freq: Every day | NASAL | 0 refills | Status: DC

## 2020-02-02 NOTE — ED Provider Notes (Signed)
[facility]    CSN: [id number] Arrival date & time: 02/02/20  [id number]      History   Chief Complaint Chief Complaint  Patient presents with  . sinus sxs    HPI Sandra Parker is a 44 y.o. female.   who present to the urgent care with a complaint of nasal drainage, ear pain, cough and sore throat for the past 4 days.  Denies sick exposure to COVID, flu or strep.  Denies recent travel.  Denies aggravating or alleviating symptoms.  Denies previous COVID infection.   Denies fever, chills, fatigue,rhinorrhea, SOB, wheezing, chest pain, nausea, vomiting, changes in bowel or bladder habits.       Past Medical History:  Diagnosis Date  . Anxiety   . Depression   . GERD (gastroesophageal reflux disease)   . Macromastia 10/2018  . TMJ (temporomandibular joint syndrome)     Patient Active Problem List   Diagnosis Date Noted  . Acne vulgaris 08/28/2015  . Insomnia 04/24/2015  . Large breasts 09/12/2014  . Mid back pain 09/12/2014  . Tendinitis of forearm 08/28/2014  . Surgical menopause on hormone replacement therapy 08/28/2014  . Migraine headache 06/17/2014  . Rotator cuff syndrome 04/28/2014  . Overweight (BMI 25.0-29.9) 07/20/2013  . Pain in joint, shoulder region 07/20/2013  . Vertigo 06/19/2013  . Anxiety   . Depression     Past Surgical History:  Procedure Laterality Date  . APPENDECTOMY    . BREAST REDUCTION SURGERY Bilateral 11/22/2018   Procedure: BREAST REDUCTION WITH LIPOSUCTION;  Surgeon: Cristine Polio, MD;  Location: [facility];  Service: Plastics;  Laterality: Bilateral;  Bilateral   . HYSTERECTOMY ABDOMINAL WITH SALPINGO-OOPHORECTOMY Left 04/15/2002  . HYSTEROSCOPY WITH D & C  08/02/2001  . LAPAROSCOPIC CHOLECYSTECTOMY  08/16/2010  . REDUCTION MAMMAPLASTY Bilateral    bilateral breast reduction nov 2019    OB History    Gravida  2   Para  2   Term      Preterm      AB      Living  2     SAB      TAB      Ectopic      Multiple      Live Births               Home Medications    Prior to Admission medications   Medication Sig Start Date End Date Taking? Authorizing Provider  ALPRAZolam Duanne Moron) 0.5 MG tablet TAKE (1) TABLET BY MOUTH TWICE DAILY AS NEEDED. 11/24/18   Alycia Rossetti, MD  benzonatate (TESSALON) 100 MG capsule Take 1 capsule (100 mg total) by mouth every 8 (eight) hours. 02/02/20   Rosaland Shiffman, Darrelyn Hillock, FNP  cyclobenzaprine (FLEXERIL) 10 MG tablet Take 1 tablet (10 mg total) by mouth 2 (two) times daily as needed for muscle spasms. 10/10/19   [place], Modena Nunnery, MD  diazepam (VALIUM) 2 MG tablet Take 1 tablet (2 mg total) by mouth 2 (two) times daily as needed (Vertigo). 04/12/19   [place], Modena Nunnery, MD  estradiol (ESTRACE) 0.5 MG tablet TAKE 1 TABLET BY MOUTH DAILY. 11/14/19   [place], Modena Nunnery, MD  fluticasone [facility]) 50 MCG/ACT nasal spray Place 1 spray into both nostrils daily for 14 days. 02/02/20 02/16/20  Corie Allis, Darrelyn Hillock, FNP  HYDROcodone-acetaminophen (NORCO/VICODIN) 5-325 MG tablet TAKE 1 TABLET BY MOUTH EVERY 6 HOURS AS NEEDED FOR MODERATE PAIN. 10/10/19   Alycia Rossetti, MD  HYDROcodone-homatropine (  HYCODAN) 5-1.5 MG/5ML syrup Take 5 mLs by mouth every 8 (eight) hours as needed for cough. 12/21/19   Susy Frizzle, MD  menthol-cetylpyridinium (CEPACOL) 3 MG lozenge Take 1 lozenge (3 mg total) by mouth as needed for sore throat. 02/02/20   Emerson Monte, FNP  pantoprazole (PROTONIX) 40 MG tablet TAKE 1 TABLET BY MOUTH DAILY 10/31/19   [place], Modena Nunnery, MD  valACYclovir (VALTREX) 1000 MG tablet Take 1 tablet (1,000 mg total) by mouth 2 (two) times daily. X 1 day 07/04/19   Alycia Rossetti, MD    Family History Family History  Problem Relation Age of Onset  . Healthy Mother   . Healthy Father     Social History Social History   Tobacco Use  . Smoking status: Never Smoker  . Smokeless tobacco: Never Used  Substance Use Topics  . Alcohol use: Not  Currently  . Drug use: No     Allergies   Prozac [fluoxetine], Wellbutrin [bupropion], Cephalexin, Ciprofloxacin, and Relafen [nabumetone]   Review of Systems Review of Systems  Constitutional: Negative.   HENT: Positive for ear pain, rhinorrhea and sore throat.   Respiratory: Positive for cough.   Cardiovascular: Negative.   Gastrointestinal: Negative.   Neurological: Negative.      Physical Exam Triage Vital Signs ED Triage Vitals  Enc Vitals Group     BP      Pulse      Resp      Temp      Temp src      SpO2      Weight      Height      Head Circumference      Peak Flow      Pain Score      Pain Loc      Pain Edu?      Excl. in [place]?    No data found.  Updated Vital Signs BP 118/86 (BP Location: Right Arm)   Pulse 85   Temp 97.9 F (36.6 C) (Oral)   Resp 16   SpO2 98%   Visual Acuity Right Eye Distance:   Left Eye Distance:   Bilateral Distance:    Right Eye Near:   Left Eye Near:    Bilateral Near:     Physical Exam Vitals and nursing note reviewed.  Constitutional:      General: She is not in acute distress.    Appearance: Normal appearance. She is normal weight. She is not ill-appearing or toxic-appearing.  HENT:     Head: Normocephalic.     Right Ear: Tympanic membrane, ear canal and external ear normal. There is no impacted cerumen.     Left Ear: Tympanic membrane, ear canal and external ear normal. There is no impacted cerumen.     Nose: Nose normal. No congestion.     Mouth/Throat:     Mouth: Mucous membranes are moist.     Pharynx: Oropharynx is clear. No oropharyngeal exudate or posterior oropharyngeal erythema.     Tonsils: No tonsillar exudate or tonsillar abscesses. 2+ on the right. 2+ on the left.  Cardiovascular:     Rate and Rhythm: Normal rate and regular rhythm.     Pulses: Normal pulses.     Heart sounds: Normal heart sounds. No murmur.  Pulmonary:     Effort: Pulmonary effort is normal. No respiratory distress.      Breath sounds: Normal breath sounds. No wheezing or rhonchi.  Chest:  Chest wall: No tenderness.  Abdominal:     General: Abdomen is flat. Bowel sounds are normal. There is no distension.     Palpations: There is no mass.     Tenderness: There is no abdominal tenderness.  Skin:    Capillary Refill: Capillary refill takes less than 2 seconds.  Neurological:     General: No focal deficit present.     Mental Status: She is alert and oriented to person, place, and time.      UC Treatments / Results  Labs (all labs ordered are listed, but only abnormal results are displayed) Labs Reviewed  NOVEL CORONAVIRUS, NAA  CULTURE, GROUP A STREP [facility])  POCT RAPID STREP A (OFFICE)    EKG   Radiology No results found.  Procedures Procedures (including critical care time)  Medications Ordered in UC Medications - No data to display  Initial Impression / Assessment and Plan / UC Course  I have reviewed the triage vital signs and the nursing notes.  Pertinent labs & imaging results that were available during my care of the patient were reviewed by me and considered in my medical decision making (see chart for details).     COVID-19 SUSPECT COVID-19 test was ordered  Flonase was ordered [employer] was ordered Advised patient to quarantine To go to ED for worsening of symptoms  SORE THROAT POCT strep test was ordered and result was negative for strep  Sample sent for culture Cepacol lozenges was ordered  Final Clinical Impressions(s) / UC Diagnoses   Final diagnoses:  Suspected COVID-19 virus infection  Sore throat     Discharge Instructions     COVID testing ordered.  It will take between 2-7 days for test results.  Someone will contact you regarding abnormal results.    In the meantime: You should remain isolated in your home for 10 days from symptom onset AND greater than 72 hours after symptoms resolution (absence of fever without the use of fever-reducing  medication and improvement in respiratory symptoms), whichever is longer Get plenty of rest and push fluids Tessalon Perles prescribed for cough Flonase prescribed for nasal congestion and runny nose Cepacol lozenges prescribed for sore throat Continue to use Mucinex Use medications daily for symptom relief Use OTC medications like ibuprofen or tylenol as needed fever or pain Call or go to the ED if you have any new or worsening symptoms such as fever, worsening cough, shortness of breath, chest tightness, chest pain, turning blue, changes in mental status, etc...     ED Prescriptions    Medication Sig Dispense Auth. Provider   benzonatate (TESSALON) 100 MG capsule Take 1 capsule (100 mg total) by mouth every 8 (eight) hours. 30 capsule Eliyohu Class S, FNP   fluticasone (FLONASE) 50 MCG/ACT nasal spray Place 1 spray into both nostrils daily for 14 days. 16 g Samaya Boardley, Darrelyn Hillock, FNP   menthol-cetylpyridinium (CEPACOL) 3 MG lozenge Take 1 lozenge (3 mg total) by mouth as needed for sore throat. 100 tablet Sharnese Heath, Darrelyn Hillock, FNP     PDMP not reviewed this encounter.   Emerson Monte, FNP 02/02/20 1017

## 2020-02-02 NOTE — ED Triage Notes (Signed)
Pt presents to UC w/ c/o sinus drainage, ear pain, sore throat, cough x4 days.

## 2020-02-02 NOTE — Discharge Instructions (Addendum)
POC strep test was negative COVID testing ordered.  It will take between 2-7 days for test result  In the meantime: You should remain isolated in your home for 10 days from symptom onset AND greater than 72 hours after symptoms resolution (absence of fever without the use of fever-reducing medication and improvement in respiratory symptoms), whichever is longer Get plenty of rest and push fluids Tessalon Perles prescribed for cough Flonase prescribed for nasal congestion and runny nose Cepacol lozenges prescribed for sore throat Continue to use Mucinex Use medications daily for symptom relief Use OTC medications like ibuprofen or tylenol as needed fever or pain Call or go to the ED if you have any new or worsening symptoms such as fever, worsening cough, shortness of breath, chest tightness, chest pain, turning blue, changes in mental status, etc..Marland Kitchen

## 2020-02-02 NOTE — Progress Notes (Signed)
Subjective:    Patient ID: Sandra Parker, female    DOB: 1976-01-05, 44 y.o.   MRN: [id number]  HPI  Patient is being seen today as a telephone visit.  She consents to be seen via telephone.  Phone call began at 845.  Phone call concluded at 852.  She spoke to my partner 2 days ago.  At that time she was having rhinorrhea, postnasal drip, sore throat, and body aches.  She had had the symptoms for 3 days.  Today is day 6 of her symptoms.  She was referred for a Covid test.  Her Covid test returned negative.  She comes back today complaining of similar symptoms.  She continues to have postnasal drainage, sore throat, hoarse voice.  She does report pressure in her sinuses.  She denies any pain in her sinuses.  She denies any fever.  She has been taking Tylenol, ibuprofen, and Alka-Seltzer.  She denies any chest pain.  She denies any shortness of breath.  She does have an occasional cough due to postnasal drip. Past Medical History:  Diagnosis Date  . Anxiety   . Depression   . GERD (gastroesophageal reflux disease)   . Macromastia 10/2018  . TMJ (temporomandibular joint syndrome)    Past Surgical History:  Procedure Laterality Date  . APPENDECTOMY    . BREAST REDUCTION SURGERY Bilateral 11/22/2018   Procedure: BREAST REDUCTION WITH LIPOSUCTION;  Surgeon: Cristine Polio, MD;  Location: [place];  Service: Plastics;  Laterality: Bilateral;  Bilateral   . HYSTERECTOMY ABDOMINAL WITH SALPINGO-OOPHORECTOMY Left 04/15/2002  . HYSTEROSCOPY WITH D & C  08/02/2001  . LAPAROSCOPIC CHOLECYSTECTOMY  08/16/2010  . REDUCTION MAMMAPLASTY Bilateral    bilateral breast reduction nov 2019   Current Outpatient Medications on File Prior to Visit  Medication Sig Dispense Refill  . ALPRAZolam (XANAX) 0.5 MG tablet TAKE (1) TABLET BY MOUTH TWICE DAILY AS NEEDED. 60 tablet 0  . cyclobenzaprine (FLEXERIL) 10 MG tablet Take 1 tablet (10 mg total) by mouth 2 (two) times daily as needed for muscle  spasms. 30 tablet 0  . diazepam (VALIUM) 2 MG tablet Take 1 tablet (2 mg total) by mouth 2 (two) times daily as needed (Vertigo). 30 tablet 0  . estradiol (ESTRACE) 0.5 MG tablet TAKE 1 TABLET BY MOUTH DAILY. 30 tablet 3  . HYDROcodone-acetaminophen (NORCO/VICODIN) 5-325 MG tablet TAKE 1 TABLET BY MOUTH EVERY 6 HOURS AS NEEDED FOR MODERATE PAIN. 30 tablet 0  . HYDROcodone-homatropine (HYCODAN) 5-1.5 MG/5ML syrup Take 5 mLs by mouth every 8 (eight) hours as needed for cough. 120 mL 0  . pantoprazole (PROTONIX) 40 MG tablet TAKE 1 TABLET BY MOUTH DAILY 90 tablet 1  . valACYclovir (VALTREX) 1000 MG tablet Take 1 tablet (1,000 mg total) by mouth 2 (two) times daily. X 1 day 20 tablet 2   No current facility-administered medications on file prior to visit.   Allergies  Allergen Reactions  . Prozac [Fluoxetine] Other (See Comments)    HEADACHES  . Wellbutrin [Bupropion] Other (See Comments)    NIGHTMARES  . Cephalexin Rash  . Ciprofloxacin Rash  . Relafen [Nabumetone] Other (See Comments)    UNKNOWN   Social History   Socioeconomic History  . Marital status: Divorced    Spouse name: Not on file  . Number of children: Not on file  . Years of education: Not on file  . Highest education level: Not on file  Occupational History  . Not on file  Tobacco Use  . Smoking status: Never Smoker  . Smokeless tobacco: Never Used  Substance and Sexual Activity  . Alcohol use: Not Currently  . Drug use: No  . Sexual activity: Not on file  Other Topics Concern  . Not on file  Social History Narrative  . Not on file   Social Determinants of Health   Financial Resource Strain:   . Difficulty of Paying Living Expenses: Not on file  Food Insecurity:   . Worried About Charity fundraiser in the Last Year: Not on file  . Ran Out of Food in the Last Year: Not on file  Transportation Needs:   . Lack of Transportation (Medical): Not on file  . Lack of Transportation (Non-Medical): Not on file    Physical Activity:   . Days of Exercise per Week: Not on file  . Minutes of Exercise per Session: Not on file  Stress:   . Feeling of Stress : Not on file  Social Connections:   . Frequency of Communication with Friends and Family: Not on file  . Frequency of Social Gatherings with Friends and Family: Not on file  . Attends Religious Services: Not on file  . Active Member of Clubs or Organizations: Not on file  . Attends Archivist Meetings: Not on file  . Marital Status: Not on file  Intimate Partner Violence:   . Fear of Current or Ex-Partner: Not on file  . Emotionally Abused: Not on file  . Physically Abused: Not on file  . Sexually Abused: Not on file     Review of Systems  All other systems reviewed and are negative.      Objective:   Physical Exam    Physical exam cannot be performed today as the patient was seen as a telephone visit however she does sound hoarse over the telephone.  She is not demonstrating any respiratory distress.  She does not cough at all during her encounter.  She has nonlabored in her breathing.  She is afebrile per her own report.    Assessment & Plan:  Viral URI  Patient symptoms sound consistent with a viral upper respiratory infection.  I recommended supportive care only for a total of 7 to 10 days.  I have recommended Sudafed for head congestion and drainage.  She can use Mucinex also for head congestion or chest congestion.  She can use Chloraseptic or Cepacol lozenges for sore throat.  She can use Tylenol or ibuprofen as needed for headaches or fever or body aches.  I explained that 90% of sinus inflammation improves over 7 to 10 days regardless of antibiotics and therefore I would recommend tincture of time for the weekend unless she develops high fever or severe pain in her sinuses at which point I would treat her for a bacterial sinus infection.  However anticipate that her symptoms are gradually improved through the weekend and  she should be fine to return to work by Monday.

## 2020-02-03 LAB — NOVEL CORONAVIRUS, NAA: SARS-CoV-2, NAA: NOT DETECTED

## 2020-02-05 LAB — CULTURE, GROUP A STREP (THRC)

## 2020-03-07 ENCOUNTER — Telehealth: Payer: Self-pay | Admitting: Family Medicine

## 2020-03-07 NOTE — Telephone Encounter (Signed)
Patient called requesting a refill on phentermine she uses [facility]. She states they were supposed to fax over request since her prescription had expired.  CB# [phone]

## 2020-03-07 NOTE — Telephone Encounter (Signed)
Declined, pt is not on this medication I no longer had her taking since the fall of  2020

## 2020-03-07 NOTE — Telephone Encounter (Signed)
Medication is not on current list.   MD please advise.  

## 2020-03-08 NOTE — Telephone Encounter (Signed)
Call placed to patient. LMTRC.  

## 2020-03-08 NOTE — Telephone Encounter (Signed)
Patient returned call and made aware.   States that she would like to discuss with PCP.   Appointment scheduled.

## 2020-03-13 ENCOUNTER — Ambulatory Visit (INDEPENDENT_AMBULATORY_CARE_PROVIDER_SITE_OTHER): Payer: BC Managed Care – PPO | Admitting: Family Medicine

## 2020-03-13 ENCOUNTER — Encounter: Payer: Self-pay | Admitting: Family Medicine

## 2020-03-13 ENCOUNTER — Other Ambulatory Visit: Payer: Self-pay

## 2020-03-13 VITALS — Ht 61.0 in | Wt 160.0 lb

## 2020-03-13 DIAGNOSIS — E669 Obesity, unspecified: Secondary | ICD-10-CM | POA: Diagnosis not present

## 2020-03-13 DIAGNOSIS — G4700 Insomnia, unspecified: Secondary | ICD-10-CM

## 2020-03-13 DIAGNOSIS — Z566 Other physical and mental strain related to work: Secondary | ICD-10-CM | POA: Diagnosis not present

## 2020-03-13 MED ORDER — PHENTERMINE HCL 37.5 MG PO TABS: 37.5000 mg | ORAL_TABLET | Freq: Every day | ORAL | 1 refills | Status: DC

## 2020-03-13 NOTE — Progress Notes (Signed)
Virtual Visit via Telephone Note  Pt unable to connect to video platform  I connected with Sandra Parker on 03/13/20 at 12:13pm by telephone and verified that I am speaking with the correct person using two identifiers.      Pt location: at home   Physician location:  In office, [facility], Vic Blackbird MD     On call: patient and physician   I discussed the limitations, risks, security and privacy concerns of performing an evaluation and management service by telephone and the availability of in person appointments. I also discussed with the patient that there may be a patient responsible charge related to this service. The patient expressed understanding and agreed to proceed.   History of Present Illness: Pt wants to discuss restarting phentermine. She has been on phentermine in the past and did well with the medication, was maintaining weight 150-152 while exercising and counting calories. She has been slowly gaining weight, mostly due to stress. Her job has changed with new management and things are very stressful. She has been stress eating and snacking more with increased overall appetite. She has not been to the GYM in setting of COVID-19 therefore not exercising. GYM was also a stress reliever for her.  Saturday Morning 3/13 weight  160lbs, prompting appointment  She has not been sleeping well, worse than usual. She has known insomnia, when she gets up in the middle night will often snack on things since she cant go back to sleep. She does take herxanax, typically 1-2 times a day    Observations/Objective: Unable to observe , NAD noted on the phone   Assessment and Plan: Obesity Class1 - BMI 30, restart phentermine has tolerated In past, she will go back to Lose it APP, calories were around 1300-1500, will try to track 3 days a week, so that she is not overwhelmed  Chronic insomnia - trial of melatonin at bedtime, continue alprazalam prn  Stress at work  Follow Up  Instructions:  6 weeks     I discussed the assessment and treatment plan with the patient. The patient was provided an opportunity to ask questions and all were answered. The patient agreed with the plan and demonstrated an understanding of the instructions.   The patient was advised to call back or seek an in-person evaluation if the symptoms worsen or if the condition fails to improve as anticipated.  I provided 15   minutes of non-face-to-face time during this encounter. End TIme: 12:28pm   Vic Blackbird, MD

## 2020-04-02 ENCOUNTER — Other Ambulatory Visit: Payer: Self-pay | Admitting: Family Medicine

## 2020-05-08 ENCOUNTER — Encounter: Payer: Self-pay | Admitting: Family Medicine

## 2020-05-08 ENCOUNTER — Other Ambulatory Visit: Payer: Self-pay

## 2020-05-08 ENCOUNTER — Ambulatory Visit: Payer: BC Managed Care – PPO | Admitting: Family Medicine

## 2020-05-08 VITALS — BP 112/82 | HR 82 | Temp 98.9°F | Resp 14 | Ht 61.0 in | Wt 148.0 lb

## 2020-05-08 DIAGNOSIS — F419 Anxiety disorder, unspecified: Secondary | ICD-10-CM

## 2020-05-08 DIAGNOSIS — G4700 Insomnia, unspecified: Secondary | ICD-10-CM | POA: Diagnosis not present

## 2020-05-08 DIAGNOSIS — E663 Overweight: Secondary | ICD-10-CM | POA: Diagnosis not present

## 2020-05-08 DIAGNOSIS — B001 Herpesviral vesicular dermatitis: Secondary | ICD-10-CM

## 2020-05-08 MED ORDER — ACYCLOVIR 400 MG PO TABS: 400.0000 mg | ORAL_TABLET | Freq: Two times a day (BID) | ORAL | 0 refills | Status: DC

## 2020-05-08 MED ORDER — PHENTERMINE HCL 37.5 MG PO TABS: 37.5000 mg | ORAL_TABLET | Freq: Every day | ORAL | 2 refills | Status: DC

## 2020-05-08 MED ORDER — SCOPOLAMINE 1 MG/3DAYS TD PT72: 1.0000 | MEDICATED_PATCH | TRANSDERMAL | 12 refills | Status: DC

## 2020-05-08 NOTE — Progress Notes (Signed)
   Subjective:    Patient ID: Sandra Parker, female    DOB: Apr 29, 1976, 44 y.o.   MRN: [id number]  Patient presents for Follow-up (is not fasting) and [place] [facility] (would like to change to acyclovir at [facility]- last prescribed by Lenore Cordia OB GYN )  Obesity weight was 160lbs  Un March and she was restarted on  phentermine , not had any side effects with the medication.  She has been tracking her food intake on the  Lose it APP, calories have been between 1300-1500,, she is walking most days a week   Chronic insomnia -has been taking f melatonin at bedtime, continue alprazalam prn  Even with the stress at work this combination has been helping.   She is going to [place] needs antinausea patch.  She also needs her acyclovir refilled her GYN was filling the medication.   Review Of Systems:  GEN- denies fatigue, fever, weight loss,weakness, recent illness HEENT- denies eye drainage, change in vision, nasal discharge, CVS- denies chest pain, palpitations RESP- denies SOB, cough, wheeze ABD- denies N/V, change in stools, abd pain GU- denies dysuria, hematuria, dribbling, incontinence MSK- denies joint pain, muscle aches, injury Neuro- denies headache, dizziness, syncope, seizure activity       Objective:    BP 112/82   Pulse 82   Temp 98.9 F (37.2 C) (Temporal)   Resp 14   Ht 5\' 1"  (1.549 m)   Wt 148 lb (67.1 kg)   SpO2 98%   BMI 27.96 kg/m  GEN- NAD, alert and oriented x3 HEENT- PERRL, EOMI, non injected sclera, pink conjunctiva, MMM, oropharynx clear Neck- Supple, no thyromegaly CVS- RRR, no murmur RESP-CTAB EXT- No edema Pulses- Radial 2+        Assessment & Plan:      Problem List Items Addressed This Visit      Unprioritized   Anxiety    Prn xanax, feels she has a good control on anxiety at this time       Insomnia    Sleep improved , continue melatonin      Overweight (BMI 25.0-29.9) - Primary    Doing well with phentermine, continue  current dose Goal based on her height would be down another 10-15lbs Continue with low-carb diet and calorie counting.  Exercise 5-6 days a week      Recurrent cold sores    Taking ayclovir for prophylaxis      Relevant Medications   acyclovir (ZOVIRAX) 400 MG tablet      Note: This dictation was prepared with Dragon dictation along with smaller phrase technology. Any transcriptional errors that result from this process are unintentional.

## 2020-05-08 NOTE — Assessment & Plan Note (Signed)
Sleep improved , continue melatonin

## 2020-05-08 NOTE — Assessment & Plan Note (Signed)
Doing well with phentermine, continue current dose Goal based on her height would be down another 10-15lbs Continue with low-carb diet and calorie counting.  Exercise 5-6 days a week

## 2020-05-08 NOTE — Assessment & Plan Note (Signed)
Taking ayclovir for prophylaxis

## 2020-05-08 NOTE — Assessment & Plan Note (Signed)
Prn xanax, feels she has a good control on anxiety at this time

## 2020-05-08 NOTE — Patient Instructions (Signed)
F/U 3 months  Patch for nausea sent

## 2020-05-16 ENCOUNTER — Telehealth: Payer: Self-pay | Admitting: Family Medicine

## 2020-05-16 ENCOUNTER — Other Ambulatory Visit: Payer: Self-pay | Admitting: Family Medicine

## 2020-05-16 NOTE — Telephone Encounter (Signed)
Cb#[id number] Refill on estradiol

## 2020-05-30 ENCOUNTER — Encounter: Payer: Self-pay | Admitting: Family Medicine

## 2020-05-30 ENCOUNTER — Other Ambulatory Visit: Payer: Self-pay

## 2020-05-30 ENCOUNTER — Ambulatory Visit: Payer: BC Managed Care – PPO | Admitting: Family Medicine

## 2020-05-30 VITALS — BP 114/62 | HR 96 | Temp 97.9°F | Resp 18 | Ht 61.0 in | Wt 144.0 lb

## 2020-05-30 DIAGNOSIS — N6320 Unspecified lump in the left breast, unspecified quadrant: Secondary | ICD-10-CM

## 2020-05-30 NOTE — Patient Instructions (Addendum)
Mammogram to be done  F/U as previous

## 2020-05-30 NOTE — Progress Notes (Signed)
   Subjective:    Patient ID: Sandra Parker, female    DOB: 1976/12/07, 44 y.o.   MRN: [id number]  Patient presents for Breast Mass     Pt here with left breast pain that she noticed on Sunday while washing. She felt a nodule when she did a breast check afterwards that is very tender. She has not noticed any redness on skin, no rash, no nipple discharge  Normal screening mammogram in December  2020     Review Of Systems:  GEN- denies fatigue, fever, weight loss,weakness, recent illness CVS- denies chest pain, palpitations RESP- denies SOB, cough, wheeze        Objective:    BP 114/62   Pulse 96   Temp 97.9 F (36.6 C) (Temporal)   Resp 18   Ht 5\' 1"  (1.549 m)   Wt 144 lb (65.3 kg)   SpO2 98%   BMI 27.21 kg/m  GEN- NAD, alert and oriented, Breast- normal symmetry, no nipple inversion,no nipple drainage, Left breast 7 oclock position peak size tender nodule palpated  Nodes- no axillary nodes CVS-RRR.murmur RESP-CTAB         Assessment & Plan:      Problem List Items Addressed This Visit    None    Visit Diagnoses    Left breast mass    -  Primary   Diagnostic mammogram to be done, r/o breast cancer, cyst, fibroadenoma, no acute sign of abscess    Relevant Orders   US BREAST LTD UNI LEFT INC AXILLA   MM DIAG BREAST TOMO UNI LEFT      Note: This dictation was prepared with Dragon dictation along with smaller Company secretary. Any transcriptional errors that result from this process are unintentional.

## 2020-06-01 ENCOUNTER — Ambulatory Visit
Admission: RE | Admit: 2020-06-01 | Discharge: 2020-06-01 | Disposition: A | Discharge status: Home / Self Care | Payer: BC Managed Care – PPO | Source: Ambulatory Visit | Attending: Family Medicine | Admitting: Family Medicine

## 2020-06-01 ENCOUNTER — Other Ambulatory Visit: Payer: Self-pay

## 2020-06-01 DIAGNOSIS — N6320 Unspecified lump in the left breast, unspecified quadrant: Secondary | ICD-10-CM

## 2020-06-04 ENCOUNTER — Other Ambulatory Visit: Payer: Self-pay | Admitting: Family Medicine

## 2020-06-11 ENCOUNTER — Other Ambulatory Visit: Payer: BC Managed Care – PPO

## 2020-06-18 ENCOUNTER — Other Ambulatory Visit: Payer: Self-pay | Admitting: Family Medicine

## 2020-07-02 ENCOUNTER — Other Ambulatory Visit: Payer: Self-pay | Admitting: Family Medicine

## 2020-07-03 ENCOUNTER — Encounter: Payer: Self-pay | Admitting: Family Medicine

## 2020-07-03 ENCOUNTER — Other Ambulatory Visit: Payer: Self-pay

## 2020-07-03 ENCOUNTER — Telehealth (INDEPENDENT_AMBULATORY_CARE_PROVIDER_SITE_OTHER): Payer: BC Managed Care – PPO | Admitting: Family Medicine

## 2020-07-03 DIAGNOSIS — F419 Anxiety disorder, unspecified: Secondary | ICD-10-CM | POA: Diagnosis not present

## 2020-07-03 DIAGNOSIS — G43709 Chronic migraine without aura, not intractable, without status migrainosus: Secondary | ICD-10-CM

## 2020-07-03 MED ORDER — RIZATRIPTAN BENZOATE 10 MG PO TABS: 10.0000 mg | ORAL_TABLET | ORAL | 0 refills | Status: DC | PRN

## 2020-07-03 MED ORDER — ALPRAZOLAM 0.5 MG PO TABS: ORAL_TABLET | ORAL | 1 refills | Status: DC

## 2020-07-03 NOTE — Progress Notes (Signed)
Virtual Visit via Telephone Note  I connected with Sandra Parker on 07/03/20 at 3:19pm  by telephone and verified that I am speaking with the correct person using two identifiers.      Pt location: at home   Physician location:  In office, [facility], Vic Blackbird MD     On call: patient and physician     PT [place], CONVERTED TO TELEPHONE VISIT   I discussed the limitations, risks, security and privacy concerns of performing an evaluation and management service by telephone and the availability of in person appointments. I also discussed with the patient that there may be a patient responsible charge related to this service. The patient expressed understanding and agreed to proceed.   History of Present Illness: Pt had severe migraine last night. She has had increased headaches the past couple of weeks.   Last night woke her up, no vomiting, no change in vision, felt a little dizzy headed  She took Valium which helped a little and then she took hydrocodone  and has slept most of the day since then.  She missed work today due to migraine  She admits to some stress due to family issues and grief from step-father passing . She has support of her family She has been drinking a lot of water  She has been using xanax to prn to help with anxiety and sleep      Observations/Objective:  NAD noted over phone , normal speech     Assessment and Plan: Migraine headache- Improved with rest, fluids, valium/pain meds, increased stressors due to anniversary of family members death/birthday Will refill xanax to use prn anxiety/sleep  given maxalt to use prn migraine  Since these have surfaced recently, will see how much prn needed before adding preventative medication again   Follow Up Instructions:    I discussed the assessment and treatment plan with the patient. The patient was provided an opportunity to ask questions and all were answered. The patient  agreed with the plan and demonstrated an understanding of the instructions.   The patient was advised to call back or seek an in-person evaluation if the symptoms worsen or if the condition fails to improve as anticipated.  I provided 5 minutes of non-face-to-face time during this encounter. End Time 3:24pm   Vic Blackbird, MD

## 2020-07-04 ENCOUNTER — Encounter: Payer: Self-pay | Admitting: Family Medicine

## 2020-07-23 ENCOUNTER — Other Ambulatory Visit: Payer: Self-pay | Admitting: Family Medicine

## 2020-08-03 ENCOUNTER — Other Ambulatory Visit: Payer: Self-pay | Admitting: *Deleted

## 2020-08-03 MED ORDER — DIAZEPAM 2 MG PO TABS: 2.0000 mg | ORAL_TABLET | Freq: Two times a day (BID) | ORAL | 0 refills | Status: DC | PRN

## 2020-08-03 NOTE — Telephone Encounter (Signed)
Received call from patient.   Requested refill on Diazepam for vertigo.  Ok to refill??  Last office visit 07/04/2020.  Last refill 04/12/2019.

## 2020-08-06 ENCOUNTER — Other Ambulatory Visit: Payer: Self-pay

## 2020-08-06 ENCOUNTER — Encounter: Payer: Self-pay | Admitting: Family Medicine

## 2020-08-06 ENCOUNTER — Ambulatory Visit: Payer: BC Managed Care – PPO | Admitting: Family Medicine

## 2020-08-06 VITALS — BP 116/70 | HR 74 | Temp 97.9°F | Resp 14 | Ht 61.0 in | Wt 144.0 lb

## 2020-08-06 DIAGNOSIS — R42 Dizziness and giddiness: Secondary | ICD-10-CM | POA: Diagnosis not present

## 2020-08-06 DIAGNOSIS — G43709 Chronic migraine without aura, not intractable, without status migrainosus: Secondary | ICD-10-CM | POA: Diagnosis not present

## 2020-08-06 MED ORDER — MECLIZINE HCL 12.5 MG PO TABS: 12.5000 mg | ORAL_TABLET | Freq: Three times a day (TID) | ORAL | 0 refills | Status: DC | PRN

## 2020-08-06 NOTE — Patient Instructions (Addendum)
MRI of brain done  Take the meclizine as needed You can stop the diazepam  Rest  F/U pending results

## 2020-08-06 NOTE — Progress Notes (Signed)
   Subjective:    Patient ID: Sandra Parker, female    DOB: 01-31-1976, 44 y.o.   MRN: [id number]  Patient presents for Vertigo (is using diazepam with little to no relief)   Friday she reached up to get a  Cup from her cupboard in a day had sudden onset of dizziness with the room spinning.  This lasted all day.  She had nausea and vomiting associated.  It did come down some last Saturday but she has had persistent balance issues and still feeling dizzy randomly.  She has been taking the diazepam but this makes her very sleepy.  And groggy.  She was unable to go into work today.  This is the worst spell that she has had in the past 2 months but she has been getting shorter spells more frequently.  She has not noted any dizzy spells associated with her migraines.  Note she has not had any brain imaging done. No new meds  no recent illness       Review Of Systems:  GEN- denies fatigue, fever, weight loss,weakness, recent illness HEENT- denies eye drainage, change in vision, nasal discharge, CVS- denies chest pain, palpitations RESP- denies SOB, cough, wheeze ABD- + N/V, change in stools, abd pain GU- denies dysuria, hematuria, dribbling, incontinence MSK- denies joint pain, muscle aches, injury Neuro- denies headache, +dizziness, syncope, seizure activity       Objective:    BP 116/70   Pulse 74   Temp 97.9 F (36.6 C) (Temporal)   Resp 14   Ht 5\' 1"  (1.549 m)   Wt 144 lb (65.3 kg)   SpO2 98%   BMI 27.21 kg/m  GEN- NAD, alert and oriented x3 HEENT- PERRL, EOMI, non injected sclera, pink conjunctiva, MMM, oropharynx clear, TM clear no effusion  Neck- Supple, no thyromegaly, no bruit  CVS- RRR, no murmur RESP-CTAB ABD-NABS,soft,NT,ND NEURO-CNII-XII in tact no focal deficits , neg rhombergs  EXT- No edema Pulses- Radial  2+        Assessment & Plan:      Problem List Items Addressed This Visit      Unprioritized   Migraine headache   Relevant Orders   MR Brain Wo  Contrast   Vertigo - Primary    Recurrent vertigo spells which have been increasing also with underlying migraine headache.  I think she warrants imaging of the brain Other white matter disease.  She left the age where her MS is often found.  We will also need to rule out any masslike lesion that could contribute to the dizziness and the migraines.  Pending results will send to Vestibular rehab We will switch her back to meclizine 12.5 mg up to 3 times a day as needed.  Given note for work today. Avoid Using Computers/ Smartphone Today to Help rest the eyes         Relevant Orders   MR Brain Wo Contrast      Note: This dictation was prepared with Dragon dictation along with smaller phrase technology. Any transcriptional errors that result from this process are unintentional.

## 2020-08-06 NOTE — Assessment & Plan Note (Addendum)
Recurrent vertigo spells which have been increasing also with underlying migraine headache.  I think she warrants imaging of the brain Other white matter disease.  She left the age where her MS is often found.  We will also need to rule out any masslike lesion that could contribute to the dizziness and the migraines.  Pending results will send to Vestibular rehab We will switch her back to meclizine 12.5 mg up to 3 times a day as needed.  Given note for work today. Avoid Using Computers/ Smartphone Today to Help rest the eyes

## 2020-08-07 ENCOUNTER — Telehealth: Payer: Self-pay | Admitting: Family Medicine

## 2020-08-07 ENCOUNTER — Ambulatory Visit (HOSPITAL_COMMUNITY)
Admission: RE | Admit: 2020-08-07 | Discharge: 2020-08-07 | Disposition: A | Discharge status: Home / Self Care | Payer: BC Managed Care – PPO | Source: Ambulatory Visit | Attending: Family Medicine | Admitting: Family Medicine

## 2020-08-07 DIAGNOSIS — R42 Dizziness and giddiness: Secondary | ICD-10-CM | POA: Diagnosis present

## 2020-08-07 DIAGNOSIS — G43709 Chronic migraine without aura, not intractable, without status migrainosus: Secondary | ICD-10-CM | POA: Insufficient documentation

## 2020-08-07 NOTE — Telephone Encounter (Signed)
Per chart notes, patient may be sent to vestibular rehab pending results of MRI.   Call placed to patient. [place].

## 2020-08-07 NOTE — Telephone Encounter (Signed)
When I called patient with her MRI appt she asked me about physical therapy. I don't see a referral for Physical Therapy. She states the referral is for vertigo.   CB# [phone]

## 2020-08-08 MED ORDER — ONDANSETRON HCL 4 MG PO TABS: 4.0000 mg | ORAL_TABLET | Freq: Three times a day (TID) | ORAL | 0 refills | Status: DC | PRN

## 2020-08-08 NOTE — Telephone Encounter (Signed)
Call placed to patient and patient made aware.   Patient reports that she did return to work on 08/07/2020, but left early to have imaging. Reports that vertigo was very bad this morning, so she did not go to work today. Requesting extension of workk note.   Also requesting medication for nausea D/T vertigo.   MD please advise.

## 2020-08-08 NOTE — Telephone Encounter (Signed)
Prescription sent to pharmacy.   Call placed to patient and patient made aware per VM  Letter transcribed.

## 2020-08-08 NOTE — Telephone Encounter (Signed)
Call placed to patient. LMTRC.  

## 2020-08-08 NOTE — Telephone Encounter (Signed)
Okay to give new work note  Send zofran 4mg  every 8 hours prn #20

## 2020-08-09 ENCOUNTER — Other Ambulatory Visit: Payer: Self-pay | Admitting: *Deleted

## 2020-08-09 DIAGNOSIS — R42 Dizziness and giddiness: Secondary | ICD-10-CM

## 2020-08-09 DIAGNOSIS — G43709 Chronic migraine without aura, not intractable, without status migrainosus: Secondary | ICD-10-CM

## 2020-08-21 ENCOUNTER — Ambulatory Visit (HOSPITAL_COMMUNITY): Payer: BC Managed Care – PPO

## 2020-08-27 ENCOUNTER — Other Ambulatory Visit: Payer: Self-pay | Admitting: Family Medicine

## 2020-09-03 ENCOUNTER — Other Ambulatory Visit: Payer: Self-pay | Admitting: Family Medicine

## 2020-09-25 ENCOUNTER — Other Ambulatory Visit: Payer: Self-pay | Admitting: Family Medicine

## 2020-10-16 ENCOUNTER — Ambulatory Visit: Payer: BC Managed Care – PPO | Admitting: Neurology

## 2020-10-27 ENCOUNTER — Other Ambulatory Visit: Payer: Self-pay | Admitting: Family Medicine

## 2020-10-28 ENCOUNTER — Other Ambulatory Visit: Payer: Self-pay | Admitting: Family Medicine

## 2020-11-08 ENCOUNTER — Other Ambulatory Visit: Payer: Self-pay | Admitting: *Deleted

## 2020-11-08 NOTE — Telephone Encounter (Signed)
Received call from patient.   Reports that she was given compounded cream for [place] called [facility] Cream (Boric Acid 10%, Zinc Oxide, Eucerin, and Vaseline compound). Requested refill.   Ok to call in refill?

## 2020-11-09 NOTE — Telephone Encounter (Signed)
Okay to refill cream

## 2020-11-09 NOTE — Telephone Encounter (Signed)
Call placed to patient and patient made aware.  

## 2020-11-29 ENCOUNTER — Other Ambulatory Visit: Payer: Self-pay | Admitting: *Deleted

## 2020-11-29 DIAGNOSIS — Z1231 Encounter for screening mammogram for malignant neoplasm of breast: Secondary | ICD-10-CM

## 2020-12-03 ENCOUNTER — Other Ambulatory Visit: Payer: Self-pay

## 2020-12-03 ENCOUNTER — Ambulatory Visit (HOSPITAL_COMMUNITY)
Admission: RE | Admit: 2020-12-03 | Discharge: 2020-12-03 | Disposition: A | Discharge status: Home / Self Care | Payer: BC Managed Care – PPO | Source: Ambulatory Visit | Attending: Family Medicine | Admitting: Family Medicine

## 2020-12-03 ENCOUNTER — Ambulatory Visit (INDEPENDENT_AMBULATORY_CARE_PROVIDER_SITE_OTHER): Payer: BC Managed Care – PPO | Admitting: Family Medicine

## 2020-12-03 ENCOUNTER — Encounter: Payer: Self-pay | Admitting: Family Medicine

## 2020-12-03 VITALS — BP 116/70 | HR 72 | Temp 98.2°F | Resp 14 | Ht 61.0 in | Wt 138.0 lb

## 2020-12-03 DIAGNOSIS — E663 Overweight: Secondary | ICD-10-CM | POA: Diagnosis not present

## 2020-12-03 DIAGNOSIS — Z0001 Encounter for general adult medical examination with abnormal findings: Secondary | ICD-10-CM

## 2020-12-03 DIAGNOSIS — Z1159 Encounter for screening for other viral diseases: Secondary | ICD-10-CM

## 2020-12-03 DIAGNOSIS — Z23 Encounter for immunization: Secondary | ICD-10-CM

## 2020-12-03 DIAGNOSIS — Z1231 Encounter for screening mammogram for malignant neoplasm of breast: Secondary | ICD-10-CM | POA: Diagnosis not present

## 2020-12-03 DIAGNOSIS — E8940 Asymptomatic postprocedural ovarian failure: Secondary | ICD-10-CM

## 2020-12-03 DIAGNOSIS — F419 Anxiety disorder, unspecified: Secondary | ICD-10-CM

## 2020-12-03 DIAGNOSIS — Z0000 Encounter for general adult medical examination without abnormal findings: Secondary | ICD-10-CM

## 2020-12-03 DIAGNOSIS — Z79890 Hormone replacement therapy (postmenopausal): Secondary | ICD-10-CM

## 2020-12-03 DIAGNOSIS — G43709 Chronic migraine without aura, not intractable, without status migrainosus: Secondary | ICD-10-CM

## 2020-12-03 MED ORDER — ESTRADIOL 0.5 MG PO TABS
0.5000 mg | ORAL_TABLET | Freq: Every day | ORAL | 0 refills | Status: DC
Start: 2020-12-03 — End: 2021-02-15

## 2020-12-03 MED ORDER — PHENTERMINE HCL 37.5 MG PO TABS
37.5000 mg | ORAL_TABLET | Freq: Every day | ORAL | 1 refills | Status: DC
Start: 2020-12-03 — End: 2021-07-24

## 2020-12-03 MED ORDER — SCOPOLAMINE 1 MG/3DAYS TD PT72
1.0000 | MEDICATED_PATCH | TRANSDERMAL | 12 refills | Status: DC
Start: 2020-12-03 — End: 2021-07-24

## 2020-12-03 MED ORDER — ALPRAZOLAM 0.5 MG PO TABS
ORAL_TABLET | ORAL | 1 refills | Status: DC
Start: 2020-12-03 — End: 2021-06-10

## 2020-12-03 NOTE — Patient Instructions (Addendum)
Reduce phentermine 1/2 tablet daily and see how you do  We will call with lab results  TDAP to be done Lift weights  2-3 times a week  F/U 2 months with Sandra Parker, Nurse Practitioner

## 2020-12-03 NOTE — Assessment & Plan Note (Signed)
Doing well with weight loss, she has added in resistance training  Will increase to 2-3 times a week She has been taking both 1/2 and full doses of phentermine with no real change in appetite Reduce to 1/2 tablet phentermine and see how she does with maintaining weight

## 2020-12-03 NOTE — Assessment & Plan Note (Signed)
Prn xanax, does not use daily Has valium for vertigo spells only

## 2020-12-03 NOTE — Progress Notes (Signed)
Subjective:    Patient ID: Sandra Parker, female    DOB: 1976-05-16, 44 y.o.   MRN: [id number]  Patient presents for Annual Exam (is fasting)   Pt here for CPE, medications and history reviewed    Opthalomology - Dr. Su Hoff [facility]- NO pap Due Hysterectomy   Mammogram UTD  Due for TDAP   Phentermine- taking full tablet most days,   Migarines- controlled with maxalt    Still on estrogen daily - needs refill - 90 day supply    Vertigo, no recent use Valium     GAD- taking xanax     - needs Transderm patch for her trip      Walking most days and incorporated some weight training           Review Of Systems:  GEN- denies fatigue, fever, weight loss,weakness, recent illness HEENT- denies eye drainage, change in vision, nasal discharge, CVS- denies chest pain, palpitations RESP- denies SOB, cough, wheeze ABD- denies N/V, change in stools, abd pain GU- denies dysuria, hematuria, dribbling, incontinence MSK- denies joint pain, muscle aches, injury Neuro- denies headache, dizziness, syncope, seizure activity       Objective:    BP 116/70   Pulse 72   Temp 98.2 F (36.8 C) (Temporal)   Resp 14   Ht 5\' 1"  (1.549 m)   Wt 138 lb (62.6 kg)   SpO2 98%   BMI 26.07 kg/m  GEN- NAD, alert and oriented x3 HEENT- PERRL, EOMI, non injected sclera, pink conjunctiva, MMM, oropharynx clear , TM clear no effusion  Neck- Supple, no thyromegaly CVS- RRR, no murmur RESP-CTAB ABD-NABS,soft,NT,ND Psych normal affect and mood  EXT- No edema Pulses- Radial, DP- 2+        Assessment & Plan:      Problem List Items Addressed This Visit      Unprioritized   Anxiety    Prn xanax, does not use daily Has valium for vertigo spells only       Relevant Medications   ALPRAZolam (XANAX) 0.5 MG tablet   Migraine headache    Doing well with current meds, no changes       Overweight (BMI 25.0-29.9)    Doing well with weight loss, she has added in  resistance training  Will increase to 2-3 times a week She has been taking both 1/2 and full doses of phentermine with no real change in appetite Reduce to 1/2 tablet phentermine and see how she does with maintaining weight       Surgical menopause on hormone replacement therapy    Continue estrogen replacement Has mammogram today        Other Visit Diagnoses    Routine general medical examination at a health care facility    -  Primary   CPE done, fasting labs obtained, TDAP given    Relevant Orders   CBC with Differential/Platelet (Completed)   Comprehensive metabolic panel (Completed)   Lipid panel (Completed)   HIV Antibody (routine testing w rflx)   Tdap vaccine greater than or equal to 7yo IM (Completed)   Need for hepatitis C screening test       Relevant Orders   Hepatitis C antibody   Need for tetanus, diphtheria, and acellular pertussis (Tdap) vaccine in patient of adolescent age or older       Relevant Orders   Tdap vaccine greater than or equal to 7yo IM (Completed)  Note: This dictation was prepared with Dragon dictation along with smaller phrase technology. Any transcriptional errors that result from this process are unintentional.

## 2020-12-03 NOTE — Assessment & Plan Note (Signed)
Continue estrogen replacement Has mammogram today

## 2020-12-04 ENCOUNTER — Encounter: Payer: Self-pay | Admitting: Family Medicine

## 2020-12-04 LAB — CBC WITH DIFFERENTIAL/PLATELET
Absolute Monocytes: 428 cells/uL (ref 200–950)
Basophils Absolute: 47 cells/uL (ref 0–200)
Basophils Relative: 0.5 %
Eosinophils Absolute: 167 cells/uL (ref 15–500)
Eosinophils Relative: 1.8 %
HCT: 38.8 % (ref 35.0–45.0)
Hemoglobin: 13.2 g/dL (ref 11.7–15.5)
Lymphs Abs: 3450 cells/uL (ref 850–3900)
MCH: 29.1 pg (ref 27.0–33.0)
MCHC: 34 g/dL (ref 32.0–36.0)
MCV: 85.5 fL (ref 80.0–100.0)
MPV: 11.5 fL (ref 7.5–12.5)
Monocytes Relative: 4.6 %
Neutro Abs: 5208 cells/uL (ref 1500–7800)
Neutrophils Relative %: 56 %
Platelets: 229 10*3/uL (ref 140–400)
RBC: 4.54 10*6/uL (ref 3.80–5.10)
RDW: 12.1 % (ref 11.0–15.0)
Total Lymphocyte: 37.1 %
WBC: 9.3 10*3/uL (ref 3.8–10.8)

## 2020-12-04 LAB — LIPID PANEL
Cholesterol: 220 mg/dL — ABNORMAL HIGH (ref <200)
HDL: 58 mg/dL (ref >=50)
LDL Cholesterol (Calc): 128 mg/dL (calc) — ABNORMAL HIGH
Non-HDL Cholesterol (Calc): 162 mg/dL (calc) — ABNORMAL HIGH (ref <130)
Total CHOL/HDL Ratio: 3.8 (calc) (ref <5.0)
Triglycerides: 207 mg/dL — ABNORMAL HIGH (ref <150)

## 2020-12-04 LAB — COMPREHENSIVE METABOLIC PANEL
AG Ratio: 1.8 (calc) (ref 1.0–2.5)
ALT: 19 U/L (ref 6–29)
AST: 16 U/L (ref 10–35)
Albumin: 4.5 g/dL (ref 3.6–5.1)
Alkaline phosphatase (APISO): 61 U/L (ref 31–125)
BUN: 12 mg/dL (ref 7–25)
CO2: 24 mmol/L (ref 20–32)
Calcium: 9.3 mg/dL (ref 8.6–10.2)
Chloride: 104 mmol/L (ref 98–110)
Creat: 0.85 mg/dL (ref 0.50–1.10)
Globulin: 2.5 g/dL (calc) (ref 1.9–3.7)
Glucose, Bld: 73 mg/dL (ref 65–99)
Potassium: 3.7 mmol/L (ref 3.5–5.3)
Sodium: 139 mmol/L (ref 135–146)
Total Bilirubin: 0.4 mg/dL (ref 0.2–1.2)
Total Protein: 7 g/dL (ref 6.1–8.1)

## 2020-12-04 LAB — HEPATITIS C ANTIBODY
Hepatitis C Ab: NONREACTIVE
SIGNAL TO CUT-OFF: 0.01 (ref <1.00)

## 2020-12-04 LAB — HIV ANTIBODY (ROUTINE TESTING W REFLEX): HIV 1&2 Ab, 4th Generation: NONREACTIVE

## 2020-12-04 NOTE — Assessment & Plan Note (Signed)
Doing well with current meds, no changes

## 2020-12-10 ENCOUNTER — Other Ambulatory Visit: Payer: Self-pay | Admitting: Family Medicine

## 2020-12-27 ENCOUNTER — Telehealth: Payer: Self-pay | Admitting: Family Medicine

## 2020-12-27 NOTE — Telephone Encounter (Signed)
Pt need for Dr.Biddeford to fax a order for  Fanney Cream to [facility]

## 2020-12-27 NOTE — Telephone Encounter (Signed)
Fanny Cream from [employer] (Boric Acid 10%, Zinc Oxide, Eucerin, and Vaseline compound) called to the pharmacy with refills.

## 2021-01-01 ENCOUNTER — Other Ambulatory Visit: Payer: Self-pay

## 2021-01-01 ENCOUNTER — Telehealth (INDEPENDENT_AMBULATORY_CARE_PROVIDER_SITE_OTHER): Payer: BC Managed Care – PPO | Admitting: Nurse Practitioner

## 2021-01-01 ENCOUNTER — Encounter: Payer: Self-pay | Admitting: Nurse Practitioner

## 2021-01-01 DIAGNOSIS — J069 Acute upper respiratory infection, unspecified: Secondary | ICD-10-CM

## 2021-01-01 MED ORDER — AFRIN NASAL SPRAY 0.05 % NA SOLN: 1.0000 | Freq: Two times a day (BID) | NASAL | 0 refills | Status: DC

## 2021-01-01 MED ORDER — BENZONATATE 100 MG PO CAPS: 100.0000 mg | ORAL_CAPSULE | Freq: Two times a day (BID) | ORAL | 0 refills | Status: DC | PRN

## 2021-01-01 MED ORDER — FLUTICASONE PROPIONATE 50 MCG/ACT NA SUSP: 2.0000 | Freq: Every day | NASAL | 6 refills | Status: DC

## 2021-01-01 NOTE — Progress Notes (Signed)
Subjective:    Patient ID: Sandra Parker, female    DOB: 10/20/1976, 45 y.o.   MRN: [id number]  HPI: Sandra Parker is a 45 y.o. female presenting virtually for upper respiratory infection.  Chief Complaint  Patient presents with  . Sinus Problem    For the past 3 days having sinus pressure, neg covid test 12/30/20. Taking nyquil, vicks and mucinex for the sx, not working at all. Covid vaccinated, declines flu vaccine   UPPER RESPIRATORY TRACT INFECTION Onset: 3 days Worst symptom: cough, deep, dry Fever: no Cough: yes; deep and dry Shortness of breath: no Wheezing: no Chest pain: yes, with cough Chest tightness: no Chest congestion: no Nasal congestion: yes Runny nose: no Post nasal drip: yes Sneezing: no Sore throat: yes Swollen glands: no Sinus pressure: yes Headache: yes Face pain: no Toothache: no Ear pain: no  Ear pressure: no  Eyes red/itching:no Eye drainage/crusting: no  Nausea: no Vomiting: no Diarrhea: no Change in appetite: no Loss of taste/smell: no Rash: no Fatigue: yes Sick contacts: no Strep contacts: no  Context: stable Recurrent sinusitis: no Treatments attempted: Vicks, Nyquil, Mucinex Relief with OTC medications: no  Allergies  Allergen Reactions  . Prozac [Fluoxetine] Other (See Comments)    HEADACHES  . Wellbutrin [Bupropion] Other (See Comments)    NIGHTMARES  . Cephalexin Rash  . Ciprofloxacin Rash  . Relafen [Nabumetone] Other (See Comments)    UNKNOWN    Outpatient Encounter Medications as of 01/01/2021  Medication Sig  . acyclovir (ZOVIRAX) 400 MG tablet Take 1 tablet by mouth twice daily  . ALPRAZolam (XANAX) 0.5 MG tablet TAKE (1) TABLET BY MOUTH TWICE DAILY AS NEEDED.  . benzonatate (TESSALON) 100 MG capsule Take 1 capsule (100 mg total) by mouth 2 (two) times daily as needed for cough.  . diazepam (VALIUM) 2 MG tablet Take 1 tablet (2 mg total) by mouth 2 (two) times daily as needed (Vertigo).  Marland Kitchen estradiol (ESTRACE) 0.5 MG  tablet Take 1 tablet (0.5 mg total) by mouth daily.  . fluticasone (FLONASE) 50 MCG/ACT nasal spray Place 2 sprays into both nostrils daily.  . meclizine (ANTIVERT) 12.5 MG tablet Take 1 tablet (12.5 mg total) by mouth 3 (three) times daily as needed for dizziness.  . ondansetron (ZOFRAN) 4 MG tablet Take 1 tablet (4 mg total) by mouth every 8 (eight) hours as needed for nausea or vomiting.  Marland Kitchen oxymetazoline (AFRIN NASAL SPRAY) 0.05 % nasal spray Place 1 spray into both nostrils 2 (two) times daily. Do not use for more than 3 days consecutively; may cause rebound nasal congestion  . pantoprazole (PROTONIX) 40 MG tablet TAKE 1 TABLET BY MOUTH DAILY  . phentermine (ADIPEX-P) 37.5 MG tablet Take 1 tablet (37.5 mg total) by mouth daily before breakfast.  . rizatriptan (MAXALT) 10 MG tablet Take 1 tablet (10 mg total) by mouth as needed for migraine. May repeat in 2 hours if needed  . scopolamine (TRANSDERM-SCOP, 1.5 MG,) 1 MG/3DAYS Place 1 patch (1.5 mg total) onto the skin every 3 (three) days.   No facility-administered encounter medications on file as of 01/01/2021.    Patient Active Problem List   Diagnosis Date Noted  . 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 History of hysterectomy 10/22/2016  . Acne vulgaris 08/28/2015  . Insomnia 04/24/2015  . Large breasts 09/12/2014  . Mid back pain 09/12/2014  . Tendinitis of forearm 08/28/2014  . Surgical menopause on hormone replacement therapy 08/28/2014  . Migraine  headache 06/17/2014  . Rotator cuff syndrome 04/28/2014  . Upper respiratory tract infection 11/29/2013  . Overweight (BMI 25.0-29.9) 07/20/2013  . Pain in joint, shoulder region 07/20/2013  . Vertigo 06/19/2013  . Anxiety   . Depressive disorder     Past Medical History:  Diagnosis Date  . Anxiety   . Depression   . GERD (gastroesophageal reflux disease)   . Macromastia 10/2018  . TMJ (temporomandibular joint syndrome)     Relevant past medical, surgical, family and social  history reviewed and updated as indicated. Interim medical history since our last visit reviewed.  Review of Systems  Constitutional: Positive for fatigue. Negative for activity change, appetite change, chills and fever.  HENT: Positive for congestion, postnasal drip, sinus pressure and sore throat. Negative for ear discharge, ear pain, rhinorrhea, sinus pain, sneezing, tinnitus and trouble swallowing.   Eyes: Negative.  Negative for pain, discharge, redness and itching.  Respiratory: Positive for cough.   Cardiovascular: Positive for chest pain (with coughing only).  Gastrointestinal: Negative.  Negative for diarrhea, nausea and vomiting.  Skin: Negative.  Negative for color change, pallor and rash.  Neurological: Positive for headaches. Negative for weakness.  Psychiatric/Behavioral: Negative.     Per HPI unless specifically indicated above     Objective:    There were no vitals taken for this visit.  Wt Readings from Last 3 Encounters:  12/03/20 138 lb (62.6 kg)  08/06/20 144 lb (65.3 kg)  05/30/20 144 lb (65.3 kg)    Physical Exam Physical examination unable to be performed due to lack of equipment.  Patient talking in complete sentences during telemedicine visit.     Assessment & Plan:   Problem List Items Addressed This Visit      Respiratory   Upper respiratory tract infection - Primary    Acute, ongoing x days.  Will obtain COVID testing.  Reassured patient that symptoms and exam findings are most consistent with a viral upper respiratory infection and explained lack of efficacy of antibiotics against viruses.  Discussed expected course and features suggestive of secondary bacterial infection.  Continue supportive care. Increase fluid intake with water or electrolyte solution like pedialyte. Encouraged acetaminophen as needed for fever/pain. Encouraged salt water gargling, chloraseptic spray and throat lozenges. Encouraged OTC guaifenesin. Encouraged saline sinus flushes  and/or neti with humidified air.  Start tessalon perles, nasal decongestant, and flonase.  Note for work given.  With any sudden onset of chest pain or shortness of breath, go to ER.  If symptoms persist for longer than 10 days, return to clinic for re-evaluation.      Relevant Medications   fluticasone (FLONASE) 50 MCG/ACT nasal spray   benzonatate (TESSALON) 100 MG capsule   oxymetazoline (AFRIN NASAL SPRAY) 0.05 % nasal spray   Other Relevant Orders   SARS-COV-2 RNA,(COVID-19) QUAL NAAT       Follow up plan: Return if symptoms worsen or fail to improve.  This visit was completed via telephone due to the restrictions of the COVID-19 pandemic. All issues as above were discussed and addressed but no physical exam was performed. If it was felt that the patient should be evaluated in the office, they were directed there. The patient verbally consented to this visit. Patient was unable to complete an audio/visual visit due to Lack of equipment. . Location of the patient: home . Location of the provider: work . Those involved with this call:  . Provider: Mardene Celeste, DNP . CMA: Moises Blood, CMA . Front  Desk/Registration: Flavia Shipper  . Time spent on call: 13 minutes on the phone discussing health concerns. 30 minutes total spent in review of patient's record and preparation of their chart.  I verified patient identity using two factors (patient name and date of birth). Patient consents verbally to being seen via telemedicine visit today.

## 2021-01-01 NOTE — Assessment & Plan Note (Signed)
Acute, ongoing x days.  Will obtain COVID testing.  Reassured patient that symptoms and exam findings are most consistent with a viral upper respiratory infection and explained lack of efficacy of antibiotics against viruses.  Discussed expected course and features suggestive of secondary bacterial infection.  Continue supportive care. Increase fluid intake with water or electrolyte solution like pedialyte. Encouraged acetaminophen as needed for fever/pain. Encouraged salt water gargling, chloraseptic spray and throat lozenges. Encouraged OTC guaifenesin. Encouraged saline sinus flushes and/or neti with humidified air.  Start tessalon perles, nasal decongestant, and flonase.  Note for work given.  With any sudden onset of chest pain or shortness of breath, go to ER.  If symptoms persist for longer than 10 days, return to clinic for re-evaluation.

## 2021-01-01 NOTE — Patient Instructions (Signed)

## 2021-01-03 ENCOUNTER — Telehealth: Payer: Self-pay

## 2021-01-03 LAB — SARS-COV-2 RNA,(COVID-19) QUALITATIVE NAAT: SARS CoV2 RNA: NOT DETECTED

## 2021-01-03 NOTE — Telephone Encounter (Signed)
Call placed to patient.   Reports that she continues to have fever and sinus pressure. States that she is continuing Mucinex, nasal saline and tessalon with no relief.   Advised that she can't go back to work with fever. Note clearly states that she must meet CDC guideline of no fever >101* F and without antipyretics in 48hours, therefore, new note is not required.   Please advise

## 2021-01-03 NOTE — Telephone Encounter (Signed)
Patient needs another note for work dates needs to be changed.  Please contact  Pt # [phone].

## 2021-01-03 NOTE — Telephone Encounter (Signed)
Patient called in and would like something sent in for her congestion. Covid test was negative. She just had phone visit appt on 1/4.   Pharmacy [facility]

## 2021-01-03 NOTE — Telephone Encounter (Signed)
Call placed to patient and patient made aware.  

## 2021-01-03 NOTE — Telephone Encounter (Signed)
She can try OTC Sudafed for her congestion, however as her symptoms have been going on less than 1 week, antibiotics are not indicated.  Agree - if she is having fever >101, she cannot return to work.

## 2021-01-04 ENCOUNTER — Ambulatory Visit: Payer: Self-pay | Admitting: Nurse Practitioner

## 2021-01-07 ENCOUNTER — Telehealth: Payer: BC Managed Care – PPO | Admitting: Family Medicine

## 2021-01-08 ENCOUNTER — Telehealth: Payer: BC Managed Care – PPO | Admitting: Nurse Practitioner

## 2021-01-08 ENCOUNTER — Other Ambulatory Visit: Payer: Self-pay | Admitting: *Deleted

## 2021-01-08 MED ORDER — FLUCONAZOLE 150 MG PO TABS: 150.0000 mg | ORAL_TABLET | Freq: Once | ORAL | 0 refills | Status: AC

## 2021-01-08 NOTE — Telephone Encounter (Signed)
Received call from patient.   Reports that she is having Sx of yeast infection. Prescription sent to pharmacy for Diflucan. Advised that if S/Sx do not resolve after dosage, OV will be required.

## 2021-01-14 ENCOUNTER — Ambulatory Visit: Payer: Self-pay | Admitting: Family Medicine

## 2021-01-18 ENCOUNTER — Telehealth: Payer: Self-pay | Admitting: Family Medicine

## 2021-01-18 MED ORDER — AMOXICILLIN-POT CLAVULANATE 875-125 MG PO TABS: 1.0000 | ORAL_TABLET | Freq: Two times a day (BID) | ORAL | 0 refills | Status: DC

## 2021-01-18 NOTE — Telephone Encounter (Signed)
Still has a Sinus Infection would like to know if Dr.Kaaawa can send in antibodies to [facility]

## 2021-01-18 NOTE — Telephone Encounter (Signed)
Symptoms x 2 weeks, covid neg Will add augmentin BID x 7 days LVM for patient

## 2021-01-23 ENCOUNTER — Other Ambulatory Visit: Payer: Self-pay

## 2021-01-23 ENCOUNTER — Ambulatory Visit (INDEPENDENT_AMBULATORY_CARE_PROVIDER_SITE_OTHER): Payer: BC Managed Care – PPO | Admitting: Family Medicine

## 2021-01-23 ENCOUNTER — Encounter: Payer: Self-pay | Admitting: Family Medicine

## 2021-01-23 VITALS — BP 112/68 | HR 82 | Temp 98.5°F | Resp 14 | Ht 61.0 in | Wt 141.0 lb

## 2021-01-23 DIAGNOSIS — L600 Ingrowing nail: Secondary | ICD-10-CM

## 2021-01-23 DIAGNOSIS — E663 Overweight: Secondary | ICD-10-CM

## 2021-01-23 MED ORDER — FLUCONAZOLE 150 MG PO TABS: ORAL_TABLET | ORAL | 1 refills | Status: DC

## 2021-01-23 NOTE — Assessment & Plan Note (Signed)
Maintaining weight loss with phentermine Reducing risk for heart disease BMI  26 Continue with resistance training

## 2021-01-23 NOTE — Patient Instructions (Addendum)
Epson salt soak Diflucan for yeast infection  Continue the augmentin  F/U 2 months with Sandra Parker

## 2021-01-23 NOTE — Progress Notes (Signed)
   Subjective:    Patient ID: Sandra Parker, female    DOB: 1976/06/29, 45 y.o.   MRN: [id number]  Patient presents for Follow-up and Toe Injury (R great toe- possible ingrown nail- no infection)  Pt here to f/u medications She is currently on phentermine 1/2 tablet once a day.  She still trying to work with reduce carbs and sweets.  She is integrating more exercising and has recently bought a machine to help with resistance training.  No side effects with the medication.   Was evaluated for recent upper respiratory infection sinusitis that has improved she is still: Her Augmentin.  Needs Diflucan secondary to history of yeast infections with antibiotics.  Few days ago she was cutting her toenails right great toe she cut down to low she has swelling and pain in the Toe, she has not had any discharge     Review Of Systems:  GEN- denies fatigue, fever, weight loss,weakness, recent illness HEENT- denies eye drainage, change in vision, nasal discharge, CVS- denies chest pain, palpitations RESP- denies SOB, cough, wheeze ABD- denies N/V, change in stools, abd pain GU- denies dysuria, hematuria, dribbling, incontinence MSK- denies joint pain, muscle aches, injury Neuro- denies headache, dizziness, syncope, seizure activity       Objective:    BP 112/68   Pulse 82   Temp 98.5 F (36.9 C) (Temporal)   Resp 14   Ht 5\' 1"  (1.549 m)   Wt 141 lb (64 kg)   SpO2 96%   BMI 26.64 kg/m  GEN- NAD, alert and oriented x3 CVS- RRR, no murmur RESP-CTAB EXT- No edema , Right great toenail mild erythema medial aspect, mild TTP, no drainage, FROM TOE Pulses- Radial, DP- 2+        Assessment & Plan:      Problem List Items Addressed This Visit      Unprioritized   Overweight (BMI 25.0-29.9)    Maintaining weight loss with phentermine Reducing risk for heart disease BMI  26 Continue with resistance training        Other Visit Diagnoses    Ingrown toenail of right foot with  infection    -  Primary   Complete augmentin which she is on for sinuses. Epson salt soak, can elevate nail with dental floss   Relevant Medications   fluconazole (DIFLUCAN) 150 MG tablet      Note: This dictation was prepared with Dragon dictation along with smaller phrase technology. Any transcriptional errors that result from this process are unintentional.

## 2021-02-06 ENCOUNTER — Encounter: Payer: Self-pay | Admitting: Family Medicine

## 2021-02-15 ENCOUNTER — Other Ambulatory Visit: Payer: Self-pay | Admitting: Family Medicine

## 2021-03-11 ENCOUNTER — Other Ambulatory Visit: Payer: Self-pay | Admitting: Family Medicine

## 2021-04-02 ENCOUNTER — Institutional Professional Consult (permissible substitution): Payer: BC Managed Care – PPO | Admitting: Plastic Surgery

## 2021-04-10 ENCOUNTER — Ambulatory Visit: Payer: BC Managed Care – PPO | Admitting: Nurse Practitioner

## 2021-04-10 ENCOUNTER — Encounter: Payer: Self-pay | Admitting: Nurse Practitioner

## 2021-04-10 ENCOUNTER — Other Ambulatory Visit: Payer: Self-pay

## 2021-04-10 VITALS — BP 104/64 | HR 89 | Temp 98.4°F | Ht 61.0 in | Wt 147.0 lb

## 2021-04-10 DIAGNOSIS — M545 Low back pain, unspecified: Secondary | ICD-10-CM | POA: Diagnosis not present

## 2021-04-10 DIAGNOSIS — R82998 Other abnormal findings in urine: Secondary | ICD-10-CM

## 2021-04-10 DIAGNOSIS — N949 Unspecified condition associated with female genital organs and menstrual cycle: Secondary | ICD-10-CM | POA: Diagnosis not present

## 2021-04-10 LAB — URINALYSIS, ROUTINE W REFLEX MICROSCOPIC
Bacteria, UA: NONE SEEN /HPF
Bilirubin Urine: NEGATIVE
Glucose, UA: NEGATIVE
Hyaline Cast: NONE SEEN /LPF
Ketones, ur: NEGATIVE
Nitrite: NEGATIVE
Protein, ur: NEGATIVE
Specific Gravity, Urine: 1.001 (ref 1.001–1.03)
pH: 6 (ref 5.0–8.0)

## 2021-04-10 LAB — WET PREP FOR TRICH, YEAST, CLUE

## 2021-04-10 LAB — MICROSCOPIC MESSAGE

## 2021-04-10 MED ORDER — TAMSULOSIN HCL 0.4 MG PO CAPS: 0.4000 mg | ORAL_CAPSULE | Freq: Every day | ORAL | 0 refills | Status: DC

## 2021-04-10 NOTE — Progress Notes (Signed)
Subjective:    Patient ID: Sandra Parker, female    DOB: 1976-12-29, 45 y.o.   MRN: [id number]  HPI: Sandra Parker is a 45 y.o. female presenting for burning with urination.  Chief Complaint  Patient presents with  . vaginal burning    Recently done bikini wax, may be irritation from that. Having burning when urination for 1 wk   URINARY SYMPTOMS Duration: 1 week Dysuria: yes Urinary frequency: yes Urgency: yes Small volume voids: no Symptom severity: moderate Urinary incontinence: no Foul odor: no Hematuria: no Abdominal pain: no Back pain: yes; right lower side Suprapubic pain/pressure: no Flank pain: no Fever:  No Nausea: no Vomiting: no Relief with cranberry juice: no Relief with pyridium: no Status: stable Previous urinary tract infection: yes Recurrent urinary tract infection: while ago Sexual activity: 1 partner, female History of sexually transmitted disease: no Vaginal discharge: no Treatments attempted: Monistat Collagen pills started 2 weeks ago; takes fish oil and women's one a day vitamin.    Allergies  Allergen Reactions  . Prozac [Fluoxetine] Other (See Comments)    HEADACHES  . Wellbutrin [Bupropion] Other (See Comments)    NIGHTMARES  . Cephalexin Rash  . Ciprofloxacin Rash  . Relafen [Nabumetone] Other (See Comments)    UNKNOWN    Outpatient Encounter Medications as of 04/10/2021  Medication Sig  . acyclovir (ZOVIRAX) 400 MG tablet Take 1 tablet by mouth twice daily  . ALPRAZolam (XANAX) 0.5 MG tablet TAKE (1) TABLET BY MOUTH TWICE DAILY AS NEEDED.  Marland Kitchen diazepam (VALIUM) 2 MG tablet Take 1 tablet (2 mg total) by mouth 2 (two) times daily as needed (Vertigo).  Marland Kitchen estradiol (ESTRACE) 0.5 MG tablet TAKE 1 TABLET BY MOUTH DAILY.  . fluticasone (FLONASE) 50 MCG/ACT nasal spray Place 2 sprays into both nostrils daily.  . meclizine (ANTIVERT) 12.5 MG tablet Take 1 tablet (12.5 mg total) by mouth 3 (three) times daily as needed for dizziness.  .  ondansetron (ZOFRAN) 4 MG tablet Take 1 tablet (4 mg total) by mouth every 8 (eight) hours as needed for nausea or vomiting.  Marland Kitchen oxymetazoline (AFRIN NASAL SPRAY) 0.05 % nasal spray Place 1 spray into both nostrils 2 (two) times daily. Do not use for more than 3 days consecutively; may cause rebound nasal congestion  . pantoprazole (PROTONIX) 40 MG tablet TAKE 1 TABLET BY MOUTH DAILY  . phentermine (ADIPEX-P) 37.5 MG tablet Take 1 tablet (37.5 mg total) by mouth daily before breakfast.  . rizatriptan (MAXALT) 10 MG tablet Take 1 tablet (10 mg total) by mouth as needed for migraine. May repeat in 2 hours if needed  . scopolamine (TRANSDERM-SCOP, 1.5 MG,) 1 MG/3DAYS Place 1 patch (1.5 mg total) onto the skin every 3 (three) days.  . [DISCONTINUED] fluconazole (DIFLUCAN) 150 MG tablet Take 1 tablet and repeat in 3 days   No facility-administered encounter medications on file as of 04/10/2021.    Patient Active Problem List   Diagnosis Date Noted  . 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 History of hysterectomy 10/22/2016  . Acne vulgaris 08/28/2015  . Insomnia 04/24/2015  . Large breasts 09/12/2014  . Mid back pain 09/12/2014  . Tendinitis of forearm 08/28/2014  . Surgical menopause on hormone replacement therapy 08/28/2014  . Migraine headache 06/17/2014  . Rotator cuff syndrome 04/28/2014  . Upper respiratory tract infection 11/29/2013  . Overweight (BMI 25.0-29.9) 07/20/2013  . Pain in joint, shoulder region 07/20/2013  . Vertigo 06/19/2013  . Anxiety   .  Depressive disorder     Past Medical History:  Diagnosis Date  . Anxiety   . Depression   . GERD (gastroesophageal reflux disease)   . Macromastia 10/2018  . TMJ (temporomandibular joint syndrome)    Relevant past medical, surgical, family and social history reviewed and updated as indicated. Interim medical history since our last visit reviewed.  Review of Systems Per HPI unless specifically indicated above     Objective:     BP 104/64   Pulse 89   Temp 98.4 F (36.9 C)   Ht 5\' 1"  (1.549 m)   Wt 147 lb (66.7 kg)   SpO2 98%   BMI 27.78 kg/m   Wt Readings from Last 3 Encounters:  04/10/21 147 lb (66.7 kg)  01/23/21 141 lb (64 kg)  12/03/20 138 lb (62.6 kg)    Physical Exam Vitals and nursing note reviewed. Exam conducted with a chaperone present (AW).  Constitutional:      General: She is not in acute distress.    Appearance: Normal appearance. She is not toxic-appearing.  HENT:     Head: Normocephalic and atraumatic.     Right Ear: External ear normal.     Left Ear: External ear normal.     Nose: Nose normal. No congestion.  Eyes:     General: No scleral icterus.    Extraocular Movements: Extraocular movements intact.  Pulmonary:     Effort: Pulmonary effort is normal.     Breath sounds: Normal breath sounds.  Abdominal:     General: Abdomen is flat. Bowel sounds are normal. There is no distension.     Palpations: Abdomen is soft. There is no mass.     Tenderness: There is no abdominal tenderness. There is right CVA tenderness. There is no left CVA tenderness.  Genitourinary:    General: Normal vulva.     Exam position: Lithotomy position.     Pubic Area: No rash.      Labia:        Right: No rash or tenderness.        Left: No rash or tenderness.      Vagina: Normal. No vaginal discharge, erythema or tenderness.     Uterus: Absent.   Musculoskeletal:        General: Normal range of motion.     Right lower leg: No edema.     Left lower leg: No edema.  Lymphadenopathy:     Lower Body: No right inguinal adenopathy. No left inguinal adenopathy.  Skin:    General: Skin is warm and dry.     Coloration: Skin is not jaundiced or pale.     Findings: No erythema.  Neurological:     Mental Status: She is alert and oriented to person, place, and time.     Motor: No weakness.     Gait: Gait normal.  Psychiatric:        Mood and Affect: Mood normal.        Behavior: Behavior normal.         Thought Content: Thought content normal.        Judgment: Judgment normal.       Assessment & Plan:  1. Vaginal burning UA today showed small amount blood, few calcium oxalate crystals.  Wet prep negative for yeast, BV, and trich.  Patient has had hysterectomy with ovaries removed 20 years ago.  Likely etiology is kidney stone with bladder spasms.  Will start flomax 0.4 mg qhs.  Encouraged  plenty of hydration with water.  Follow up if symptoms worsen.  With severe back pain or nausea/vomiting and unable to keep fluids down, go to ER.  - Urinalysis, Routine w reflex microscopic - Microscopic Message - WET PREP FOR TRICH, YEAST, CLUE  2. Calcium oxalate crystals in urine UA today showed small amount blood, few calcium oxalate crystals.  Wet prep negative for yeast, BV, and trich.  Patient has had hysterectomy with ovaries removed 20 years ago.  Likely etiology is kidney stone with bladder spasms.  Will start flomax 0.4 mg qhs.  Encouraged plenty of hydration with water.  Follow up if symptoms worsen.  With severe back pain or nausea/vomiting and unable to keep fluids down, go to ER.  - tamsulosin (FLOMAX) 0.4 MG CAPS capsule; Take 1 capsule (0.4 mg total) by mouth at bedtime.  Dispense: 30 capsule; Refill: 0  3. Acute right-sided low back pain without sciatica UA today showed small amount blood, few calcium oxalate crystals.  Wet prep negative for yeast, BV, and trich.  Patient has had hysterectomy with ovaries removed 20 years ago.  Likely etiology is kidney stone with bladder spasms.  Will start flomax 0.4 mg qhs.  Encouraged plenty of hydration with water.  Follow up if symptoms worsen.  With severe back pain or nausea/vomiting and unable to keep fluids down, go to ER.     Follow up plan: Return if symptoms worsen or fail to improve.

## 2021-04-11 ENCOUNTER — Telehealth: Payer: Self-pay | Admitting: Family Medicine

## 2021-04-11 ENCOUNTER — Other Ambulatory Visit: Payer: Self-pay

## 2021-04-11 DIAGNOSIS — R82998 Other abnormal findings in urine: Secondary | ICD-10-CM

## 2021-04-11 MED ORDER — TAMSULOSIN HCL 0.4 MG PO CAPS: 0.4000 mg | ORAL_CAPSULE | Freq: Every day | ORAL | 0 refills | Status: DC

## 2021-04-11 NOTE — Telephone Encounter (Signed)
Medication was sent to pharmacy.

## 2021-04-11 NOTE — Telephone Encounter (Signed)
Patient called to follow up on Rx for stones; patient in pain and needs meds called in asap. Pleasd advise at [phone] when Rx called in.

## 2021-04-12 ENCOUNTER — Encounter: Payer: Self-pay | Admitting: Nurse Practitioner

## 2021-04-15 ENCOUNTER — Other Ambulatory Visit: Payer: Self-pay

## 2021-04-15 DIAGNOSIS — R82998 Other abnormal findings in urine: Secondary | ICD-10-CM

## 2021-04-15 MED ORDER — TAMSULOSIN HCL 0.4 MG PO CAPS: 0.4000 mg | ORAL_CAPSULE | Freq: Every day | ORAL | 0 refills | Status: DC

## 2021-05-07 ENCOUNTER — Ambulatory Visit (INDEPENDENT_AMBULATORY_CARE_PROVIDER_SITE_OTHER): Payer: Self-pay | Admitting: Internal Medicine

## 2021-05-17 ENCOUNTER — Other Ambulatory Visit: Payer: Self-pay | Admitting: *Deleted

## 2021-05-17 ENCOUNTER — Other Ambulatory Visit: Payer: Self-pay | Admitting: Family Medicine

## 2021-05-17 MED ORDER — VALACYCLOVIR HCL 1 G PO TABS
1000.0000 mg | ORAL_TABLET | Freq: Two times a day (BID) | ORAL | 2 refills | Status: DC
Start: 2021-05-17 — End: 2022-05-19

## 2021-05-22 ENCOUNTER — Other Ambulatory Visit: Payer: Self-pay | Admitting: *Deleted

## 2021-05-22 MED ORDER — FLUCONAZOLE 150 MG PO TABS: 150.0000 mg | ORAL_TABLET | Freq: Once | ORAL | 0 refills | Status: AC

## 2021-05-22 NOTE — Telephone Encounter (Signed)
Received call from patient.   Reports that she has yeast infection. States that she has itching and burning in vaginal canal. Also reports thick white discharge.   Prescription sent to pharmacy for Diflucan. Advised that if S/Sx do not resolve after dosage, OV will be required.   Advised to use mild soaps/ detergents to lessen occurences of irritation to vagina. Advised OTC boric acid can also be used.

## 2021-05-24 ENCOUNTER — Encounter: Payer: Self-pay | Admitting: Plastic Surgery

## 2021-05-24 ENCOUNTER — Encounter: Payer: BC Managed Care – PPO | Admitting: Plastic Surgery

## 2021-06-06 ENCOUNTER — Ambulatory Visit (INDEPENDENT_AMBULATORY_CARE_PROVIDER_SITE_OTHER): Payer: Self-pay | Admitting: Internal Medicine

## 2021-06-08 ENCOUNTER — Other Ambulatory Visit: Payer: Self-pay | Admitting: Family Medicine

## 2021-06-10 ENCOUNTER — Telehealth: Payer: Self-pay | Admitting: Nurse Practitioner

## 2021-06-10 DIAGNOSIS — J069 Acute upper respiratory infection, unspecified: Secondary | ICD-10-CM

## 2021-06-10 MED ORDER — AFRIN NASAL SPRAY 0.05 % NA SOLN: 1.0000 | Freq: Two times a day (BID) | NASAL | 0 refills | Status: DC

## 2021-06-10 NOTE — Telephone Encounter (Signed)
Pt called stating that she needs a refill of  ALPRAZolam (XANAX) 0.5 MG tablet  oxymetazoline (AFRIN NASAL SPRAY) 0.05 %    Sent to pharmacy,  Cb [phone]

## 2021-06-10 NOTE — Telephone Encounter (Signed)
Prescription sent to pharmacy for [place].   Xanax request noted as duplicate and has been sent to MD to review.

## 2021-06-10 NOTE — Telephone Encounter (Signed)
Ok to refill Xanax??  Last office visit 04/10/2021.  Last refill 12/03/2020, #1 refill.

## 2021-06-12 ENCOUNTER — Other Ambulatory Visit: Payer: Self-pay | Admitting: *Deleted

## 2021-06-12 MED ORDER — FLUCONAZOLE 150 MG PO TABS: 150.0000 mg | ORAL_TABLET | Freq: Once | ORAL | 0 refills | Status: AC

## 2021-07-03 ENCOUNTER — Ambulatory Visit: Payer: Self-pay | Admitting: Nurse Practitioner

## 2021-07-15 ENCOUNTER — Ambulatory Visit (INDEPENDENT_AMBULATORY_CARE_PROVIDER_SITE_OTHER): Payer: Self-pay | Admitting: Internal Medicine

## 2021-07-24 ENCOUNTER — Other Ambulatory Visit: Payer: Self-pay

## 2021-07-24 ENCOUNTER — Ambulatory Visit: Payer: BC Managed Care – PPO | Admitting: Nurse Practitioner

## 2021-07-24 ENCOUNTER — Encounter: Payer: Self-pay | Admitting: Nurse Practitioner

## 2021-07-24 VITALS — BP 120/84 | HR 84 | Temp 98.1°F | Ht 61.0 in | Wt 139.0 lb

## 2021-07-24 DIAGNOSIS — G43709 Chronic migraine without aura, not intractable, without status migrainosus: Secondary | ICD-10-CM

## 2021-07-24 NOTE — Progress Notes (Signed)
Subjective:    Patient ID: Sandra Parker, female    DOB: [birth year], 45 y.o.   MRN: [id number]  HPI: Sandra Parker is a 45 y.o. female presenting for ongoing migraines.  Chief Complaint  Patient presents with   Migraine    Not taking the maxalt make her feel nauseous                               No loner taking the maxalt, makes her feel nauseous       MIGRAINE Worse migraine was last week; thinks migraines come from stress.  Previously, Maxalt worked well however recently it has been making her feel sick on her stomach.  She is wondering if there are other medications she can try.  She has also taken Topamax in the past and did not like how it made her feel. Duration: years Onset: gradual Severity: moderate to severe Frequency: twice per month Location: behind eyes Headache duration: minutes to hours Radiation: no Time of day headache occurs: differ Alleviating factors: dark car, resting Aggravating factors: stress Headache status at time of visit: asymptomatic Treatments attempted: rizatriptan   Aura: no Nausea:  yes Vomiting: no Photophobia:  yes Phonophobia:  no Effect on social functioning:  somewhat  Numbers of missed days of school/work each month:  Confusion:  no Gait disturbance/ataxia:  no Behavioral changes:  no Fevers:  no  Allergies  Allergen Reactions   Prozac [Fluoxetine] Other (See Comments)    HEADACHES   Wellbutrin [Bupropion] Other (See Comments)    NIGHTMARES   Cephalexin Rash   Ciprofloxacin Rash   Relafen [Nabumetone] Other (See Comments)    UNKNOWN    Outpatient Encounter Medications as of 07/24/2021  Medication Sig   acyclovir (ZOVIRAX) 400 MG tablet Take 1 tablet by mouth twice daily   ALPRAZolam (XANAX) 0.5 MG tablet TAKE ONE TABLET BY MOUTH 2 TIMES A DAY AS NEEDED   estradiol (ESTRACE) 0.5 MG tablet TAKE 1 TABLET BY MOUTH DAILY.   meclizine (ANTIVERT) 12.5 MG tablet Take 1 tablet (12.5 mg total) by  mouth 3 (three) times daily as needed for dizziness.   pantoprazole (PROTONIX) 40 MG tablet TAKE 1 TABLET BY MOUTH DAILY   Rimegepant Sulfate 75 MG TBDP Take 75 mg by mouth.   valACYclovir (VALTREX) 1000 MG tablet Take 1 tablet (1,000 mg total) by mouth 2 (two) times daily. X 1 day   [DISCONTINUED] oxymetazoline (AFRIN NASAL SPRAY) 0.05 % nasal spray Place 1 spray into both nostrils 2 (two) times daily. Do not use for more than 3 days consecutively; may cause rebound nasal congestion   [DISCONTINUED] diazepam (VALIUM) 2 MG tablet Take 1 tablet (2 mg total) by mouth 2 (two) times daily as needed (Vertigo). (Patient not taking: Reported on 07/24/2021)   [DISCONTINUED] fluticasone (FLONASE) 50 MCG/ACT nasal spray Place 2 sprays into both nostrils daily.   [DISCONTINUED] ondansetron (ZOFRAN) 4 MG tablet Take 1 tablet (4 mg total) by mouth every 8 (eight) hours as needed for nausea or vomiting.   [DISCONTINUED] phentermine (ADIPEX-P) 37.5 MG tablet Take 1 tablet (37.5 mg total) by mouth daily before breakfast.   [DISCONTINUED] rizatriptan (MAXALT) 10 MG tablet Take 1 tablet (10 mg total) by mouth as needed for migraine. May repeat in 2 hours if needed (Patient not taking: Reported on 07/24/2021)   [DISCONTINUED] scopolamine (TRANSDERM-SCOP, 1.5 MG,) 1 MG/3DAYS Place 1 patch (1.5 mg total) onto  the skin every 3 (three) days.   [DISCONTINUED] tamsulosin (FLOMAX) 0.4 MG CAPS capsule Take 1 capsule (0.4 mg total) by mouth at bedtime.   No facility-administered encounter medications on file as of 07/24/2021.    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   History of hysterectomy 10/22/2016   Acne vulgaris 08/28/2015   Insomnia 04/24/2015   Large breasts 09/12/2014   Mid back pain 09/12/2014   Tendinitis of forearm 08/28/2014   Surgical menopause on hormone replacement therapy 08/28/2014   Migraine headache 06/17/2014   Rotator cuff syndrome 04/28/2014    Overweight (BMI 25.0-29.9) 07/20/2013   Pain in joint, shoulder region 07/20/2013   Vertigo 06/19/2013   Anxiety    Depressive disorder     Past Medical History:  Diagnosis Date   Anxiety    Depression    GERD (gastroesophageal reflux disease)    Macromastia 10/2018   TMJ (temporomandibular joint syndrome)    Upper respiratory tract infection 11/29/2013    Relevant past medical, surgical, family and social history reviewed and updated as indicated. Interim medical history since our last visit reviewed.  Review of Systems Per HPI unless specifically indicated above     Objective:    BP 120/84   Pulse 84   Temp 98.1 F (36.7 C)   Ht '5\' 1"'$  (1.549 m)   Wt 139 lb (63 kg)   SpO2 100%   BMI 26.26 kg/m   Wt Readings from Last 3 Encounters:  07/24/21 139 lb (63 kg)  04/10/21 147 lb (66.7 kg)  01/23/21 141 lb (64 kg)    Physical Exam Vitals and nursing note reviewed.  Constitutional:      General: She is not in acute distress.    Appearance: Normal appearance. She is not toxic-appearing.  HENT:     Head: Normocephalic and atraumatic.  Eyes:     General: No scleral icterus.       Right eye: No discharge.        Left eye: No discharge.     Extraocular Movements: Extraocular movements intact.     Pupils: Pupils are equal, round, and reactive to light.  Musculoskeletal:     Right lower leg: No edema.     Left lower leg: No edema.  Skin:    General: Skin is warm and dry.     Capillary Refill: Capillary refill takes less than 2 seconds.     Coloration: Skin is not jaundiced or pale.     Findings: No erythema.  Neurological:     General: No focal deficit present.     Mental Status: She is alert and oriented to person, place, and time.     Motor: No weakness.     Gait: Gait normal.  Psychiatric:        Mood and Affect: Mood normal.        Behavior: Behavior normal.        Thought Content: Thought content normal.        Judgment: Judgment normal.      Assessment &  Plan:   Problem List Items Addressed This Visit       Cardiovascular and Mediastinum   Migraine headache - Primary    Chronic.  No red flags in history or on examination today.  Given intolerance to both Maxalt and Topamax, willing to try alternative.  Samples given for Nurtec 75 mg daily.  Plan to use this at next onset  of migraine.  Patient to let us know if beneficial and can send in prescription if so.  Follow up as needed.       Relevant Medications   Rimegepant Sulfate 75 MG TBDP     Follow up plan: Return if symptoms worsen or fail to improve.

## 2021-07-24 NOTE — Assessment & Plan Note (Signed)
Chronic.  No red flags in history or on examination today.  Given intolerance to both Maxalt and Topamax, willing to try alternative.  Samples given for Nurtec 75 mg daily.  Plan to use this at next onset of migraine.  Patient to let us know if beneficial and can send in prescription if so.  Follow up as needed.

## 2021-07-25 ENCOUNTER — Ambulatory Visit: Payer: BC Managed Care – PPO | Admitting: Nurse Practitioner

## 2021-07-25 ENCOUNTER — Ambulatory Visit: Payer: Self-pay | Admitting: Nurse Practitioner

## 2021-09-03 ENCOUNTER — Ambulatory Visit (INDEPENDENT_AMBULATORY_CARE_PROVIDER_SITE_OTHER): Payer: Self-pay | Admitting: Internal Medicine

## 2021-09-23 ENCOUNTER — Telehealth: Payer: Self-pay | Admitting: *Deleted

## 2021-09-23 ENCOUNTER — Ambulatory Visit: Payer: BC Managed Care – PPO | Admitting: Nurse Practitioner

## 2021-09-23 ENCOUNTER — Other Ambulatory Visit: Payer: BC Managed Care – PPO

## 2021-09-23 ENCOUNTER — Other Ambulatory Visit: Payer: Self-pay

## 2021-09-23 VITALS — BP 108/72 | HR 74 | Temp 98.3°F | Ht 61.0 in | Wt 138.4 lb

## 2021-09-23 DIAGNOSIS — R42 Dizziness and giddiness: Secondary | ICD-10-CM | POA: Diagnosis not present

## 2021-09-23 DIAGNOSIS — M545 Low back pain, unspecified: Secondary | ICD-10-CM

## 2021-09-23 DIAGNOSIS — R300 Dysuria: Secondary | ICD-10-CM

## 2021-09-23 LAB — URINALYSIS, ROUTINE W REFLEX MICROSCOPIC
Bilirubin Urine: NEGATIVE
Glucose, UA: NEGATIVE
Hgb urine dipstick: NEGATIVE
Ketones, ur: NEGATIVE
Leukocytes,Ua: NEGATIVE
Nitrite: NEGATIVE
Protein, ur: NEGATIVE
Specific Gravity, Urine: 1.003 (ref 1.001–1.035)
pH: 6 (ref 5.0–8.0)

## 2021-09-23 LAB — URINALYSIS, MICROSCOPIC ONLY
Bacteria, UA: NONE SEEN /HPF
Hyaline Cast: NONE SEEN /LPF
RBC / HPF: NONE SEEN /HPF (ref 0–2)
WBC, UA: NONE SEEN /HPF (ref 0–5)

## 2021-09-23 MED ORDER — TAMSULOSIN HCL 0.4 MG PO CAPS: 0.4000 mg | ORAL_CAPSULE | Freq: Every day | ORAL | 0 refills | Status: DC

## 2021-09-23 MED ORDER — HYDROCODONE-ACETAMINOPHEN 5-325 MG PO TABS: 1.0000 | ORAL_TABLET | Freq: Four times a day (QID) | ORAL | 0 refills | Status: AC | PRN

## 2021-09-23 MED ORDER — MECLIZINE HCL 12.5 MG PO TABS: 12.5000 mg | ORAL_TABLET | Freq: Three times a day (TID) | ORAL | 0 refills | Status: DC | PRN

## 2021-09-23 MED ORDER — KETOROLAC TROMETHAMINE 30 MG/ML IJ SOLN
30.0000 mg | Freq: Once | INTRAMUSCULAR | Status: AC
  Administered 2021-09-23: 30 mg via INTRAMUSCULAR

## 2021-09-23 NOTE — Telephone Encounter (Signed)
Patient in office and dropped off urine sample. Reports that she is burning with urination.   Orders placed.

## 2021-09-23 NOTE — Progress Notes (Signed)
Subjective:    Patient ID: Sandra Parker, female    DOB: [birth year], 45 y.o.   MRN: [id number]  HPI: Sandra Parker is a 45 y.o. female presenting for back pain and urinary symptoms.  Chief Complaint  Patient presents with   Back Pain   URINARY SYMPTOMS Duration: day; back pain started last week Wednesday Dysuria: yes Urinary frequency: yes Urgency: yes Small volume voids: yes Symptom severity: severe Urinary incontinence: no Foul odor: no Hematuria: yes; this morning with wiping Abdominal pain: no Diarrhea: no Back pain: yes; mid to left side Suprapubic pain/pressure: no Flank pain: yes; left mid back Fever:  no Nausea: no Vomiting: no Relief with cranberry juice: no Relief with pyridium: no Status: worse Previous urinary tract infection: no Recurrent urinary tract infection: no Sexual activity: monogamous, urinates after History of sexually transmitted disease: no Vaginal discharge: no Treatments attempted: heating pad, water, cran tropical juice   Of note, she does tell me she has been drinking more diet soda lately.  She has also been having some dizziness-she thinks her vertigo is coming back.  She took some of her meclizine and this helped significantly.  She reports she is almost out of her meclizine and is requesting refill today.  Allergies  Allergen Reactions   Prozac [Fluoxetine] Other (See Comments)    HEADACHES   Wellbutrin [Bupropion] Other (See Comments)    NIGHTMARES   Cephalexin Rash   Ciprofloxacin Rash   Relafen [Nabumetone] Other (See Comments)    UNKNOWN    Outpatient Encounter Medications as of 09/23/2021  Medication Sig   tamsulosin (FLOMAX) 0.4 MG CAPS capsule Take 1 capsule (0.4 mg total) by mouth at bedtime.   acyclovir (ZOVIRAX) 400 MG tablet Take 1 tablet by mouth twice daily   ALPRAZolam (XANAX) 0.5 MG tablet TAKE ONE TABLET BY MOUTH 2 TIMES A DAY AS NEEDED   estradiol (ESTRACE) 0.5 MG tablet TAKE 1 TABLET BY MOUTH DAILY.    HYDROcodone-acetaminophen (NORCO) 5-325 MG tablet Take 1 tablet by mouth every 6 (six) hours as needed for up to 5 days for severe pain.   meclizine (ANTIVERT) 12.5 MG tablet Take 1 tablet (12.5 mg total) by mouth 3 (three) times daily as needed for dizziness.   pantoprazole (PROTONIX) 40 MG tablet TAKE 1 TABLET BY MOUTH DAILY   Rimegepant Sulfate 75 MG TBDP Take 75 mg by mouth.   valACYclovir (VALTREX) 1000 MG tablet Take 1 tablet (1,000 mg total) by mouth 2 (two) times daily. X 1 day   [DISCONTINUED] meclizine (ANTIVERT) 12.5 MG tablet Take 1 tablet (12.5 mg total) by mouth 3 (three) times daily as needed for dizziness.   [EXPIRED] ketorolac (TORADOL) 30 MG/ML injection 30 mg    No facility-administered encounter medications on file as of 09/23/2021.    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   History of hysterectomy 10/22/2016   Acne vulgaris 08/28/2015   Insomnia 04/24/2015   Large breasts 09/12/2014   Mid back pain 09/12/2014   Tendinitis of forearm 08/28/2014   Surgical menopause on hormone replacement therapy 08/28/2014   Migraine headache 06/17/2014   Rotator cuff syndrome 04/28/2014   Overweight (BMI 25.0-29.9) 07/20/2013   Pain in joint, shoulder region 07/20/2013   Vertigo 06/19/2013   Anxiety    Depressive disorder     Past Medical History:  Diagnosis Date   Anxiety    Depression    GERD (gastroesophageal reflux disease)  Macromastia 10/2018   TMJ (temporomandibular joint syndrome)    Upper respiratory tract infection 11/29/2013    Relevant past medical, surgical, family and social history reviewed and updated as indicated. Interim medical history since our last visit reviewed.  Review of Systems Per HPI unless specifically indicated above     Objective:    BP 108/72   Pulse 74   Temp 98.3 F (36.8 C) (Oral)   Ht 5\' 1"  (1.549 m)   Wt 138 lb 6.4 oz (62.8 kg)   SpO2 97%   BMI 26.15 kg/m   Wt Readings from  Last 3 Encounters:  09/24/21 138 lb 6.4 oz (62.8 kg)  07/24/21 139 lb (63 kg)  04/10/21 147 lb (66.7 kg)    Physical Exam Vitals and nursing note reviewed.  Constitutional:      General: She is not in acute distress.    Appearance: Normal appearance. She is not toxic-appearing.     Comments: Uncomfortable appearing  HENT:     Mouth/Throat:     Mouth: Mucous membranes are moist.     Pharynx: Oropharynx is clear.  Eyes:     General: No scleral icterus.    Extraocular Movements: Extraocular movements intact.  Cardiovascular:     Rate and Rhythm: Normal rate.  Pulmonary:     Effort: Pulmonary effort is normal. No respiratory distress.  Abdominal:     General: Abdomen is flat. Bowel sounds are normal. There is no distension.     Palpations: Abdomen is soft. There is no mass.     Tenderness: There is no abdominal tenderness. There is left CVA tenderness. There is no right CVA tenderness.  Skin:    General: Skin is warm and dry.     Capillary Refill: Capillary refill takes less than 2 seconds.     Coloration: Skin is not jaundiced or pale.     Findings: No erythema.  Neurological:     Mental Status: She is alert and oriented to person, place, and time.  Psychiatric:        Mood and Affect: Mood normal.        Behavior: Behavior normal.        Thought Content: Thought content normal.        Judgment: Judgment normal.      Assessment & Plan:   Problem List Items Addressed This Visit       Other   Vertigo    Chronic.  Improved with meclizine use-refill given today.  Notify us if this is not helping.      Relevant Medications   meclizine (ANTIVERT) 12.5 MG tablet   Other Visit Diagnoses     Acute left-sided low back pain without sciatica    -  Primary   Relevant Medications   tamsulosin (FLOMAX) 0.4 MG CAPS capsule   ketorolac (TORADOL) 30 MG/ML injection 30 mg (Completed)   HYDROcodone-acetaminophen (NORCO) 5-325 MG tablet   Other Relevant Orders   Urinalysis,  microscopic only (Completed)      1. Acute left-sided low back pain without sciatica Acute.  UA dipstick today normal, microscopic exam revealed some squamous epithelial cells.  Given history, highly suspicious for recurrent kidney stone.  Restart Flomax at nighttime, continue pushing water.  PDMP reviewed and appropriate and short course of narcotic pain medication given if severe pain develops.  Toradol 30 mg IM given today in office to help with pain relief.  Patient is currently able to tolerate fluids.  We discussed that if  she starts having severe back pain and is unable to tolerate fluids, is vomiting, she needs to seek emergent care.  We will hold off on CT scan imaging for now.  Patient is in agreement to this plan.  All questions answered.  Follow-up if symptoms do not improve.  - Urinalysis, microscopic only - tamsulosin (FLOMAX) 0.4 MG CAPS capsule; Take 1 capsule (0.4 mg total) by mouth at bedtime.  Dispense: 30 capsule; Refill: 0 - ketorolac (TORADOL) 30 MG/ML injection 30 mg - HYDROcodone-acetaminophen (NORCO) 5-325 MG tablet; Take 1 tablet by mouth every 6 (six) hours as needed for up to 5 days for severe pain.  Dispense: 20 tablet; Refill: 0   Follow up plan: Return if symptoms worsen or fail to improve.

## 2021-09-24 ENCOUNTER — Encounter: Payer: Self-pay | Admitting: Nurse Practitioner

## 2021-09-24 LAB — URINE CULTURE
MICRO NUMBER:: 12422114
Result:: NO GROWTH
SPECIMEN QUALITY:: ADEQUATE

## 2021-09-24 NOTE — Assessment & Plan Note (Signed)
Chronic.  Improved with meclizine use-refill given today.  Notify us if this is not helping.

## 2021-09-30 ENCOUNTER — Telehealth: Payer: BC Managed Care – PPO | Admitting: Nurse Practitioner

## 2021-10-09 ENCOUNTER — Telehealth: Payer: Self-pay | Admitting: Nurse Practitioner

## 2021-10-09 NOTE — Telephone Encounter (Signed)
Patient called to request refill of phentermine (ADIPEX-P) 37.5 MG tablet [[id number]]  Last dose taken in January or February of 2022.   Pharmacy confirmed as:  [facility], [address]  [place], [place] [address]  Phone:  [phone]  Fax:  [phone]   Appointment needed first? Please advise at [phone]

## 2021-10-09 NOTE — Telephone Encounter (Signed)
Call placed to patient to advise that OV will be required for weight management.   Appointment scheduled.

## 2021-10-10 ENCOUNTER — Ambulatory Visit: Payer: BC Managed Care – PPO | Admitting: Nurse Practitioner

## 2021-10-10 ENCOUNTER — Other Ambulatory Visit (HOSPITAL_COMMUNITY): Payer: Self-pay | Admitting: Nurse Practitioner

## 2021-10-10 DIAGNOSIS — Z1231 Encounter for screening mammogram for malignant neoplasm of breast: Secondary | ICD-10-CM

## 2021-10-11 ENCOUNTER — Ambulatory Visit (INDEPENDENT_AMBULATORY_CARE_PROVIDER_SITE_OTHER): Payer: BC Managed Care – PPO | Admitting: Nurse Practitioner

## 2021-10-11 ENCOUNTER — Other Ambulatory Visit: Payer: Self-pay

## 2021-10-11 DIAGNOSIS — Z6826 Body mass index (BMI) 26.0-26.9, adult: Secondary | ICD-10-CM

## 2021-10-11 DIAGNOSIS — E663 Overweight: Secondary | ICD-10-CM

## 2021-10-11 NOTE — Progress Notes (Addendum)
Call placed to patient x2 with no answer.  Voice mail left for patient to call to reschedule visit if still needed.  Visit rescheduled to 3:30     Subjective:    Patient ID: Sandra Parker, female    DOB: Jan 10, 1976, 45 y.o.   MRN: [id number]  HPI: Sandra Parker is a 45 y.o. female presenting virtually for obesity.  Chief Complaint  Patient presents with   Weight Loss   ELEVATED BMI: 26 Patient is requesting letter for her employer that states she is working on achieving a better BMI.  She was on phentermine in the past and would like a prescription for this as proof she is working on losing weight. Duration: chronic Previous attempts at weight loss: yes Complications of obesity: borderline elevated cholesterol Peak weight: 170s Requesting obesity pharmacotherapy: yes Current weight loss supplements/medications: no Previous weight loss supplements/meds: no Water intake: 2 - 64 oz daily 24 hour diet recall: tries to do less than 1200 calories; meal preps sometimes, does not like vegetables Physical activity: not like she should    Allergies  Allergen Reactions   Prozac [Fluoxetine] Other (See Comments)    HEADACHES   Wellbutrin [Bupropion] Other (See Comments)    NIGHTMARES   Cephalexin Rash   Ciprofloxacin Rash   Relafen [Nabumetone] Other (See Comments)    UNKNOWN    Outpatient Encounter Medications as of 10/11/2021  Medication Sig   acyclovir (ZOVIRAX) 400 MG tablet Take 1 tablet by mouth twice daily   ALPRAZolam (XANAX) 0.5 MG tablet TAKE ONE TABLET BY MOUTH 2 TIMES A DAY AS NEEDED   estradiol (ESTRACE) 0.5 MG tablet TAKE 1 TABLET BY MOUTH DAILY.   meclizine (ANTIVERT) 12.5 MG tablet Take 1 tablet (12.5 mg total) by mouth 3 (three) times daily as needed for dizziness.   pantoprazole (PROTONIX) 40 MG tablet TAKE 1 TABLET BY MOUTH DAILY   Rimegepant Sulfate 75 MG TBDP Take 75 mg by mouth.   tamsulosin (FLOMAX) 0.4 MG CAPS capsule Take 1 capsule (0.4 mg total) by mouth at  bedtime.   valACYclovir (VALTREX) 1000 MG tablet Take 1 tablet (1,000 mg total) by mouth 2 (two) times daily. X 1 day   No facility-administered encounter medications on file as of 10/11/2021.    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   History of hysterectomy 10/22/2016   Acne vulgaris 08/28/2015   Insomnia 04/24/2015   Large breasts 09/12/2014   Mid back pain 09/12/2014   Tendinitis of forearm 08/28/2014   Surgical menopause on hormone replacement therapy 08/28/2014   Migraine headache 06/17/2014   Rotator cuff syndrome 04/28/2014   Overweight (BMI 25.0-29.9) 07/20/2013   Pain in joint, shoulder region 07/20/2013   Vertigo 06/19/2013   Anxiety    Depressive disorder     Past Medical History:  Diagnosis Date   Anxiety    Depression    GERD (gastroesophageal reflux disease)    Macromastia 10/2018   TMJ (temporomandibular joint syndrome)    Upper respiratory tract infection 11/29/2013    Relevant past medical, surgical, family and social history reviewed and updated as indicated. Interim medical history since our last visit reviewed.  Review of Systems Per HPI unless specifically indicated above     Objective:    There were no vitals taken for this visit.  Wt Readings from Last 3 Encounters:  09/24/21 138 lb 6.4 oz (62.8 kg)  07/24/21 139 lb (63 kg)  04/10/21 147 lb (66.7 kg)    Physical Exam Physical examination unable to be performed due to lack of equipment.  Patient talking in complete sentences during telemedicine visit.    Assessment & Plan:  1. Overweight with body mass index (BMI) of 26 to 26.9 in adult I explained to the patient I cannot write a prescription for phentermine because she does not meet the indication for this medication; her BMI is less than 27.  Instead, we discussed dietary and lifestyle changes to help promote a healthy weight.  She should try to increase vegetable intake.  She should also try  to increase physical activity.  Goal for physical activity is 30 minutes 5 times per week. Additionally, I will write her a note for her employer saying we discussed this.    Follow up plan: Return if symptoms worsen or fail to improve.  This visit was completed via telephone due to the restrictions of the COVID-19 pandemic. All issues as above were discussed and addressed but no physical exam was performed. If it was felt that the patient should be evaluated in the office, they were directed there. The patient verbally consented to this visit. Patient was unable to complete an audio/visual visit due to Technical difficulties. Location of the patient: home Location of the provider: work Those involved with this call:  Provider: Noemi Chapel, DNP, FNP-C CMA: n/a Front Desk/Registration: Vevelyn Pat  Time spent on call:  8 minutes on the phone discussing health concerns. 12 minutes total spent in review of patient's record and preparation of their chart. I verified patient identity using two factors (patient name and date of birth). Patient consents verbally to being seen via telemedicine visit today.

## 2021-10-11 NOTE — Addendum Note (Signed)
Addended by: Noemi Chapel A on: 10/11/2021 04:01 PM   Modules accepted: Level of Service

## 2021-10-15 ENCOUNTER — Other Ambulatory Visit: Payer: Self-pay | Admitting: Family Medicine

## 2021-10-21 ENCOUNTER — Telehealth: Payer: Self-pay

## 2021-10-21 NOTE — Telephone Encounter (Signed)
Pt called in requesting a referral for a colonoscopy. Please advise.  Cb#: [phone]

## 2021-10-21 NOTE — Telephone Encounter (Signed)
Call placed to patient. LMTRC.  

## 2021-10-21 NOTE — Telephone Encounter (Signed)
Agree with plan 

## 2021-10-21 NOTE — Telephone Encounter (Signed)
Call placed to patient.   States that GYN recommended screening colonoscopy now that she is >46 years old. States that she will contact her insurance to see who is covered and callback for referral.

## 2021-10-21 NOTE — Telephone Encounter (Signed)
Please find out more information - is this for screening or is she having a new problem?

## 2021-10-23 ENCOUNTER — Encounter: Payer: Self-pay | Admitting: Nurse Practitioner

## 2021-10-23 DIAGNOSIS — Z1211 Encounter for screening for malignant neoplasm of colon: Secondary | ICD-10-CM

## 2021-10-24 ENCOUNTER — Other Ambulatory Visit: Payer: Self-pay | Admitting: *Deleted

## 2021-10-24 MED ORDER — TRETINOIN 0.025 % EX GEL: Freq: Every day | CUTANEOUS | 0 refills | Status: DC

## 2021-11-07 ENCOUNTER — Ambulatory Visit (HOSPITAL_COMMUNITY): Payer: BC Managed Care – PPO

## 2021-11-12 ENCOUNTER — Ambulatory Visit: Payer: BC Managed Care – PPO | Admitting: Family Medicine

## 2021-11-12 ENCOUNTER — Other Ambulatory Visit: Payer: Self-pay

## 2021-11-12 ENCOUNTER — Telehealth: Payer: Self-pay | Admitting: *Deleted

## 2021-11-12 ENCOUNTER — Encounter: Payer: Self-pay | Admitting: Family Medicine

## 2021-11-12 VITALS — BP 118/72 | HR 79 | Temp 98.5°F | Resp 17 | Ht 61.0 in | Wt 146.0 lb

## 2021-11-12 DIAGNOSIS — G43019 Migraine without aura, intractable, without status migrainosus: Secondary | ICD-10-CM | POA: Diagnosis not present

## 2021-11-12 MED ORDER — RIZATRIPTAN BENZOATE 10 MG PO TBDP: 10.0000 mg | ORAL_TABLET | ORAL | 0 refills | Status: DC | PRN

## 2021-11-12 MED ORDER — KETOROLAC TROMETHAMINE 60 MG/2ML IM SOLN
60.0000 mg | Freq: Once | INTRAMUSCULAR | Status: AC
  Administered 2021-11-12: 60 mg via INTRAMUSCULAR

## 2021-11-12 NOTE — Progress Notes (Signed)
Subjective:    Patient ID: Sandra Parker, female    DOB: 1976/02/09, 45 y.o.   MRN: [id number]  HPI  Patient presents today with a sinus infection.  She reports the pain behind her right eye and in her right nasal bridge.  Is a pulsing constant pain.  Is been there since Sunday.  She does have some mild rhinorrhea but this occurred suddenly and has photophobia and phonophobia associated with it.  She has a history of migraines.  She used to take Topamax but had to stop this as a preventative due to a metallic taste in her mouth.  In the past, she has tried samples of Nurtec but that did not help her headaches.  She has never tried Maxalt or Imitrex. Past Medical History:  Diagnosis Date   Anxiety    Depression    GERD (gastroesophageal reflux disease)    Macromastia 10/2018   TMJ (temporomandibular joint syndrome)    Upper respiratory tract infection 11/29/2013   .psh Current Outpatient Medications on File Prior to Visit  Medication Sig Dispense Refill   ALPRAZolam (XANAX) 0.5 MG tablet TAKE ONE TABLET BY MOUTH 2 TIMES A DAY AS NEEDED 60 tablet 0   fluticasone (FLONASE) 50 MCG/ACT nasal spray Place into both nostrils.     meclizine (ANTIVERT) 12.5 MG tablet Take 1 tablet (12.5 mg total) by mouth 3 (three) times daily as needed for dizziness. 20 tablet 0   pantoprazole (PROTONIX) 40 MG tablet TAKE 1 TABLET BY MOUTH DAILY 90 tablet 0   tretinoin (RETIN-A) 0.025 % gel Apply topically at bedtime. 45 g 0   acyclovir (ZOVIRAX) 400 MG tablet Take 1 tablet by mouth twice daily (Patient not taking: Reported on 11/12/2021) 180 tablet 0   estradiol (ESTRACE) 0.5 MG tablet TAKE 1 TABLET BY MOUTH DAILY. (Patient not taking: Reported on 11/12/2021) 90 tablet 0   Rimegepant Sulfate 75 MG TBDP Take 75 mg by mouth. (Patient not taking: Reported on 11/12/2021)     tamsulosin (FLOMAX) 0.4 MG CAPS capsule Take 1 capsule (0.4 mg total) by mouth at bedtime. (Patient not taking: Reported on 11/12/2021) 30 capsule  0   valACYclovir (VALTREX) 1000 MG tablet Take 1 tablet (1,000 mg total) by mouth 2 (two) times daily. X 1 day (Patient not taking: Reported on 11/12/2021) 2 tablet 2   No current facility-administered medications on file prior to visit.   Allergies  Allergen Reactions   Prozac [Fluoxetine] Other (See Comments)    HEADACHES   Wellbutrin [Bupropion] Other (See Comments)    NIGHTMARES   Cephalexin Rash   Ciprofloxacin Rash   Relafen [Nabumetone] Other (See Comments)    UNKNOWN   Social History   Socioeconomic History   Marital status: Divorced    Spouse name: Not on file   Number of children: Not on file   Years of education: Not on file   Highest education level: Not on file  Occupational History   Not on file  Tobacco Use   Smoking status: Never   Smokeless tobacco: Never  Vaping Use   Vaping Use: Never used  Substance and Sexual Activity   Alcohol use: Not Currently   Drug use: No   Sexual activity: Not on file  Other Topics Concern   Not on file  Social History Narrative   Not on file   Social Determinants of Health   Financial Resource Strain: Not on file  Food Insecurity: Not on file  Transportation Needs: Not  on file  Physical Activity: Not on file  Stress: Not on file  Social Connections: Not on file  Intimate Partner Violence: Not on file     Review of Systems  All other systems reviewed and are negative.     Objective:   Physical Exam Constitutional:      General: She is not in acute distress.    Appearance: Normal appearance. She is normal weight. She is not ill-appearing or toxic-appearing.  HENT:     Right Ear: Tympanic membrane and ear canal normal.     Left Ear: Tympanic membrane and ear canal normal.     Nose: Nose normal. No congestion or rhinorrhea.  Eyes:     Extraocular Movements: Extraocular movements intact.     Conjunctiva/sclera: Conjunctivae normal.     Pupils: Pupils are equal, round, and reactive to light.  Cardiovascular:      Rate and Rhythm: Normal rate and regular rhythm.     Heart sounds: Normal heart sounds. No murmur heard.   No gallop.  Pulmonary:     Effort: Pulmonary effort is normal.     Breath sounds: No stridor. No wheezing or rhonchi.  Neurological:     General: No focal deficit present.     Mental Status: She is alert and oriented to person, place, and time. Mental status is at baseline.     Cranial Nerves: No cranial nerve deficit.     Sensory: No sensory deficit.     Motor: No weakness.     Coordination: Coordination normal.     Gait: Gait normal.  Psychiatric:        Mood and Affect: Mood normal.        Thought Content: Thought content normal.          Assessment & Plan:   Intractable migraine without aura and without status migrainosus Patient was given Toradol 60 mg IM x1 now.  Begin Maxalt 10 mg p.o. x1.  May repeat in 2 hours 1 additional time if headache persist.

## 2021-11-12 NOTE — Addendum Note (Signed)
Addended by: Sheral Flow on: 11/12/2021 11:21 AM   Modules accepted: Orders

## 2021-11-12 NOTE — Telephone Encounter (Signed)
Received VM from patient.   Requested work note for 11/11/2021 and 11/12/2021 for migraine.   Note sent to patient MyChart.

## 2021-11-13 ENCOUNTER — Telehealth: Payer: Self-pay | Admitting: Nurse Practitioner

## 2021-11-13 NOTE — Telephone Encounter (Signed)
Note was transcribed and sent to MyChart.   Note printed. Requested that letter be sent to:  [email or web address]  E-Mailed to patient.

## 2021-11-13 NOTE — Telephone Encounter (Signed)
Patient called to follow up on excuse note for work for 11/14-11/15. Patient stated it's not in [address] yet; returned to work 11/15 and needs to submit note.   Please advise at [phone].

## 2021-11-19 ENCOUNTER — Telehealth: Payer: Self-pay

## 2021-11-19 ENCOUNTER — Encounter: Payer: Self-pay | Admitting: Nurse Practitioner

## 2021-11-19 NOTE — Telephone Encounter (Signed)
I asked the front office to find a time to work her in - she is on the schedule for tomorrow afternoon we will check wet prep

## 2021-11-19 NOTE — Telephone Encounter (Signed)
Patient returned call; appt scheduled for 11/23 at 2:30pm.

## 2021-11-19 NOTE — Telephone Encounter (Signed)
Pt called in requesting a refill of fluconazole (DIFLUCAN) 150 MG sent to pharmacy.  Cb#: [phone]

## 2021-11-19 NOTE — Telephone Encounter (Signed)
Can we try to work this patient in for an in-person visit tomorrow even though I am fully booked

## 2021-11-19 NOTE — Telephone Encounter (Signed)
Spoke with pt and relayed response and recommendations. Pt is upset that "it is so hard to get anything done at this office". Advised pt with one provider on vacation and it being a holiday week we are completely booked; offered pt option of UC if she feels she cannot wait. Pt states her copay is too high for UC. Urged pt to get OTC treatment, such as 3-day Monistat, for her symptoms. Pt states she has tried the 1-day version with no relief. Advised pt sometimes a longer duration is needed to completely clear symptoms. Pt asked if there were any other local providers accepting NPs, recommended she look online at [facility] website to assist with search for a new provider. Pt also states she will be sending a My Chart message to [place]. Nothing further needed at this time.   JM - Juluis Rainier

## 2021-11-19 NOTE — Telephone Encounter (Signed)
Needs OV for testing; can try OTC Monistat in the meantime to see if this helps with symptoms.  Last wet prep that was done with [place] in April did not show yeast infection.

## 2021-11-19 NOTE — Telephone Encounter (Signed)
LMTRC

## 2021-11-19 NOTE — Telephone Encounter (Signed)
Pt c/o vaginal itching and irritation since Sunday evening. Pt denies discharge. Pt states she has issues with frequent yeast infections. Pt also asked what else she could do. Advised she may need OV to discuss potential reasons or causes of frequent yeast infections.   Please advise, thanks!

## 2021-11-20 ENCOUNTER — Ambulatory Visit: Payer: BC Managed Care – PPO | Admitting: Nurse Practitioner

## 2021-11-20 ENCOUNTER — Other Ambulatory Visit: Payer: Self-pay

## 2021-11-20 ENCOUNTER — Encounter: Payer: Self-pay | Admitting: Nurse Practitioner

## 2021-11-20 VITALS — BP 122/82 | HR 80 | Temp 97.8°F | Ht 61.0 in | Wt 138.2 lb

## 2021-11-20 DIAGNOSIS — B9689 Other specified bacterial agents as the cause of diseases classified elsewhere: Secondary | ICD-10-CM

## 2021-11-20 DIAGNOSIS — N760 Acute vaginitis: Secondary | ICD-10-CM | POA: Diagnosis not present

## 2021-11-20 LAB — WET PREP FOR TRICH, YEAST, CLUE

## 2021-11-20 MED ORDER — METRONIDAZOLE 0.75 % VA GEL: 1.0000 | Freq: Every day | VAGINAL | 0 refills | Status: DC

## 2021-11-20 NOTE — Progress Notes (Signed)
Subjective:    Patient ID: Sandra Parker, female    DOB: 12/24/1976, 45 y.o.   MRN: [id number]  HPI: Sandra Parker is a 45 y.o. female presenting for vaginal discharge.  Chief Complaint  Patient presents with   Follow-up    Follow up/yeast infection    VAGINAL DISCHARGE Thinks she has a yeast infection.  Tried Monistat 1 day without relief.  Duration: days - Sunday; she recently changed soap Discharge description: white  Pruritus: yes Dysuria: no Malodorous: no Urinary frequency: no Fevers: no Abdominal pain: no  Recent antibiotic use: no Context: stable  Treatments attempted: Monistat 1 day Sexual activity: not currently sexually active.  Allergies  Allergen Reactions   Prozac [Fluoxetine] Other (See Comments)    HEADACHES   Wellbutrin [Bupropion] Other (See Comments)    NIGHTMARES   Cephalexin Rash   Ciprofloxacin Rash   Relafen [Nabumetone] Other (See Comments)    UNKNOWN    Outpatient Encounter Medications as of 11/20/2021  Medication Sig   acyclovir (ZOVIRAX) 400 MG tablet Take 1 tablet by mouth twice daily   ALPRAZolam (XANAX) 0.5 MG tablet TAKE ONE TABLET BY MOUTH 2 TIMES A DAY AS NEEDED   estradiol (ESTRACE) 0.5 MG tablet TAKE 1 TABLET BY MOUTH DAILY.   fluticasone (FLONASE) 50 MCG/ACT nasal spray Place into both nostrils.   meclizine (ANTIVERT) 12.5 MG tablet Take 1 tablet (12.5 mg total) by mouth 3 (three) times daily as needed for dizziness.   metroNIDAZOLE (METROGEL VAGINAL) 0.75 % vaginal gel Place 1 Applicatorful vaginally at bedtime.   pantoprazole (PROTONIX) 40 MG tablet TAKE 1 TABLET BY MOUTH DAILY   Rimegepant Sulfate 75 MG TBDP Take 75 mg by mouth.   rizatriptan (MAXALT-MLT) 10 MG disintegrating tablet Take 1 tablet (10 mg total) by mouth as needed for migraine. May repeat in 2 hours if needed   tamsulosin (FLOMAX) 0.4 MG CAPS capsule Take 1 capsule (0.4 mg total) by mouth at bedtime.   tretinoin (RETIN-A) 0.025 % gel Apply topically at bedtime.    valACYclovir (VALTREX) 1000 MG tablet Take 1 tablet (1,000 mg total) by mouth 2 (two) times daily. X 1 day   No facility-administered encounter medications on file as of 11/20/2021.    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   History of hysterectomy 10/22/2016   Acne vulgaris 08/28/2015   Insomnia 04/24/2015   Large breasts 09/12/2014   Mid back pain 09/12/2014   Tendinitis of forearm 08/28/2014   Surgical menopause on hormone replacement therapy 08/28/2014   Migraine headache 06/17/2014   Rotator cuff syndrome 04/28/2014   Overweight (BMI 25.0-29.9) 07/20/2013   Pain in joint, shoulder region 07/20/2013   Vertigo 06/19/2013   Anxiety    Depressive disorder     Past Medical History:  Diagnosis Date   Anxiety    Depression    GERD (gastroesophageal reflux disease)    Macromastia 10/2018   TMJ (temporomandibular joint syndrome)    Upper respiratory tract infection 11/29/2013    Relevant past medical, surgical, family and social history reviewed and updated as indicated. Interim medical history since our last visit reviewed.  Review of Systems Per HPI unless specifically indicated above     Objective:    BP 122/82   Pulse 80   Temp 97.8 F (36.6 C)   Ht 5\' 1"  (1.549 m)   Wt 138 lb 3.2 oz (62.7 kg)   SpO2 99%  BMI 26.11 kg/m   Wt Readings from Last 3 Encounters:  11/20/21 138 lb 3.2 oz (62.7 kg)  11/12/21 146 lb (66.2 kg)  09/24/21 138 lb 6.4 oz (62.8 kg)    Physical Exam Vitals and nursing note reviewed. Exam conducted with a chaperone present (KG).  Constitutional:      General: She is not in acute distress.    Appearance: Normal appearance. She is not toxic-appearing.  Genitourinary:    General: Normal vulva.     Pubic Area: No rash.      Labia:        Right: No rash, tenderness or lesion.        Left: No rash, tenderness or lesion.      Vagina: Vaginal discharge present.     Cervix: Normal.      Uterus: Normal.      Adnexa: Right adnexa normal and left adnexa normal.     Comments: Thin, white, non-odorous discharge  Lymphadenopathy:     Lower Body: No right inguinal adenopathy. No left inguinal adenopathy.  Skin:    General: Skin is warm and dry.     Coloration: Skin is not jaundiced or pale.     Findings: No erythema.  Neurological:     Mental Status: She is alert and oriented to person, place, and time.     Motor: No weakness.     Gait: Gait normal.  Psychiatric:        Mood and Affect: Mood normal.        Thought Content: Thought content normal.        Judgment: Judgment normal.      Assessment & Plan:  1. Bacterial vaginosis Acute.  Wet prep today positive for clue cells.  No yeast seen.  Start MetroGel vaginally 1 applicator at bedtime for 7 nights.  Follow-up if symptoms persist or worsen.  - WET PREP FOR TRICH, YEAST, CLUE - metroNIDAZOLE (METROGEL VAGINAL) 0.75 % vaginal gel; Place 1 Applicatorful vaginally at bedtime.  Dispense: 70 g; Refill: 0   Follow up plan: No follow-ups on file.

## 2021-12-04 ENCOUNTER — Ambulatory Visit (HOSPITAL_COMMUNITY): Payer: BC Managed Care – PPO

## 2021-12-09 ENCOUNTER — Telehealth: Payer: Self-pay | Admitting: Nurse Practitioner

## 2021-12-09 NOTE — Telephone Encounter (Signed)
Patient called to request refill of ALPRAZolam Duanne Moron) 0.5 MG tablet [[id number]]  Pharmacy confirmed as  [facility] [address]  [place], [place] [address]  Phone:  [phone]  Fax:  [phone]   Patient only has 2-3 pills left.  Please advise at [phone].

## 2021-12-10 ENCOUNTER — Other Ambulatory Visit: Payer: Self-pay | Admitting: Family Medicine

## 2021-12-11 ENCOUNTER — Telehealth: Payer: Self-pay

## 2021-12-11 ENCOUNTER — Encounter: Payer: Self-pay | Admitting: Nurse Practitioner

## 2021-12-11 NOTE — Telephone Encounter (Signed)
Responded to this in Rx Request format

## 2021-12-11 NOTE — Telephone Encounter (Signed)
error 

## 2021-12-11 NOTE — Telephone Encounter (Signed)
PDMP reviewed.  Needs follow up with me for future refills.

## 2021-12-13 ENCOUNTER — Telehealth (INDEPENDENT_AMBULATORY_CARE_PROVIDER_SITE_OTHER): Payer: BC Managed Care – PPO | Admitting: Nurse Practitioner

## 2021-12-13 ENCOUNTER — Encounter: Payer: Self-pay | Admitting: Nurse Practitioner

## 2021-12-13 DIAGNOSIS — K219 Gastro-esophageal reflux disease without esophagitis: Secondary | ICD-10-CM | POA: Diagnosis not present

## 2021-12-13 DIAGNOSIS — F419 Anxiety disorder, unspecified: Secondary | ICD-10-CM | POA: Diagnosis not present

## 2021-12-13 MED ORDER — PANTOPRAZOLE SODIUM 40 MG PO TBEC: 40.0000 mg | DELAYED_RELEASE_TABLET | Freq: Every day | ORAL | 1 refills | Status: DC

## 2021-12-13 NOTE — Assessment & Plan Note (Signed)
Chronic, stable with daily PPI.  No red flag signs or symptoms today.  Discussed side effects of long term use including poor absorption of vitamins.  Plan to continue pantoprazole 40 mg daily for now.  Discussed trigger foods.  Follow up if symptoms worsen or persist despite PPI.

## 2021-12-13 NOTE — Assessment & Plan Note (Signed)
Chronic, intermittent with panic.  Plan to continue seldom use of alprazolam.  We discussed the risks and benefits of this medication she would like to continue to use this medication as needed and agrees to not use this medication daily.  I have given her a refill for 30 tablets.  Plan to follow up in 3 months and we will sign a controlled substance agreement.  Follow up in 3 months.

## 2021-12-13 NOTE — Progress Notes (Signed)
Subjective:    Patient ID: Sandra Parker, female    DOB: 05/14/1976, 45 y.o.   MRN: [id number]  HPI: Sandra Parker is a 45 y.o. female presenting virtually for mediation management.   Chief Complaint  Patient presents with   Medication Refill   GERD Taking pantoprazole 40 mg and tolerating well.   If she misses a dose, her acid reflux comes back at nighttime severely.   GERD control status: stable Satisfied with current treatment? yes Heartburn frequency: rarely as long as on medication Medication side effects: no  Medication compliance: good Dysphagia: no Odynophagia:  no Hematemesis: no Blood in stool: no EGD: no  ANXIETY/STRESS Taking alprazolam 0.5 mg when stress hits/panic attack starts at work.  Sometimes takes at night to help with sleep.   At the most, she takes 3 x per week.  Reports this time of year is stressful with the holidays. Duration: chronic   Allergies  Allergen Reactions   Prozac [Fluoxetine] Other (See Comments)    HEADACHES   Wellbutrin [Bupropion] Other (See Comments)    NIGHTMARES   Cephalexin Rash   Ciprofloxacin Rash   Relafen [Nabumetone] Other (See Comments)    UNKNOWN    Outpatient Encounter Medications as of 12/13/2021  Medication Sig   estradiol (ESTRACE VAGINAL) 0.1 MG/GM vaginal cream 1 g   ALPRAZolam (XANAX) 0.5 MG tablet TAKE ONE TABLET BY MOUTH 2 TIMES A DAY AS NEEDED   fluticasone (FLONASE) 50 MCG/ACT nasal spray Place into both nostrils.   meclizine (ANTIVERT) 12.5 MG tablet Take 1 tablet (12.5 mg total) by mouth 3 (three) times daily as needed for dizziness.   pantoprazole (PROTONIX) 40 MG tablet Take 1 tablet (40 mg total) by mouth daily.   Rimegepant Sulfate 75 MG TBDP Take 75 mg by mouth.   rizatriptan (MAXALT-MLT) 10 MG disintegrating tablet Take 1 tablet (10 mg total) by mouth as needed for migraine. May repeat in 2 hours if needed   tretinoin (RETIN-A) 0.025 % gel Apply topically at bedtime.   valACYclovir (VALTREX)  1000 MG tablet Take 1 tablet (1,000 mg total) by mouth 2 (two) times daily. X 1 day   [DISCONTINUED] acyclovir (ZOVIRAX) 400 MG tablet Take 1 tablet by mouth twice daily   [DISCONTINUED] estradiol (ESTRACE) 0.5 MG tablet TAKE 1 TABLET BY MOUTH DAILY.   [DISCONTINUED] metroNIDAZOLE (METROGEL VAGINAL) 0.75 % vaginal gel Place 1 Applicatorful vaginally at bedtime.   [DISCONTINUED] pantoprazole (PROTONIX) 40 MG tablet TAKE 1 TABLET BY MOUTH DAILY   [DISCONTINUED] tamsulosin (FLOMAX) 0.4 MG CAPS capsule Take 1 capsule (0.4 mg total) by mouth at bedtime.   No facility-administered encounter medications on file as of 12/13/2021.    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   History of hysterectomy 10/22/2016   Acne vulgaris 08/28/2015   Insomnia 04/24/2015   Large breasts 09/12/2014   Mid back pain 09/12/2014   Tendinitis of forearm 08/28/2014   Surgical menopause on hormone replacement therapy 08/28/2014   Migraine headache 06/17/2014   Rotator cuff syndrome 04/28/2014   Overweight (BMI 25.0-29.9) 07/20/2013   Pain in joint, shoulder region 07/20/2013   Vertigo 06/19/2013   Anxiety    Depressive disorder     Past Medical History:  Diagnosis Date   Anxiety    Depression    GERD (gastroesophageal reflux disease)    Macromastia 10/2018   TMJ (temporomandibular joint  syndrome)    Upper respiratory tract infection 11/29/2013    Relevant past medical, surgical, family and social history reviewed and updated as indicated. Interim medical history since our last visit reviewed.  Review of Systems Per HPI unless specifically indicated above     Objective:    There were no vitals taken for this visit.  Wt Readings from Last 3 Encounters:  11/20/21 138 lb 3.2 oz (62.7 kg)  11/12/21 146 lb (66.2 kg)  09/24/21 138 lb 6.4 oz (62.8 kg)    Physical Exam Physical examination  unable to be performed due to equipment malfunction.    Assessment & Plan:   Problem List Items Addressed This Visit       Digestive   Gastroesophageal reflux disease without esophagitis    Chronic, stable with daily PPI.  No red flag signs or symptoms today.  Discussed side effects of long term use including poor absorption of vitamins.  Plan to continue pantoprazole 40 mg daily for now.  Discussed trigger foods.  Follow up if symptoms worsen or persist despite PPI.        Relevant Medications   pantoprazole (PROTONIX) 40 MG tablet     Other   Anxiety - Primary    Chronic, intermittent with panic.  Plan to continue seldom use of alprazolam.  We discussed the risks and benefits of this medication she would like to continue to use this medication as needed and agrees to not use this medication daily.  I have given her a refill for 30 tablets.  Plan to follow up in 3 months and we will sign a controlled substance agreement.  Follow up in 3 months.           Follow up plan: Return in about 3 months (around 03/13/2022) for mood follow up.  This visit was completed via telephone due to the restrictions of the COVID-19 pandemic. All issues as above were discussed and addressed but no physical exam was performed. If it was felt that the patient should be evaluated in the office, they were directed there. The patient verbally consented to this visit. Patient was unable to complete an audio/visual visit due to Technical difficulties.  Patient able to connect to video visit but video camera was not allowing her use.  Visit was then transitioned to a phone visit. Location of the patient: work Location of the provider: work Those involved with this call:  Provider: Noemi Chapel, DNP, FNP-C CMA: n/a Front Desk/Registration: Santina Evans  Time spent on call:  8 minutes on the phone discussing health concerns. 12 minutes total spent in review of patient's record and preparation of their chart. I  verified patient identity using two factors (patient name and date of birth). Patient consents verbally to being seen via telemedicine visit today.

## 2021-12-20 ENCOUNTER — Other Ambulatory Visit: Payer: Self-pay

## 2021-12-20 ENCOUNTER — Ambulatory Visit (HOSPITAL_COMMUNITY)
Admission: RE | Admit: 2021-12-20 | Discharge: 2021-12-20 | Disposition: A | Discharge status: Home / Self Care | Payer: BC Managed Care – PPO | Source: Ambulatory Visit | Attending: Nurse Practitioner | Admitting: Nurse Practitioner

## 2021-12-20 DIAGNOSIS — Z1231 Encounter for screening mammogram for malignant neoplasm of breast: Secondary | ICD-10-CM | POA: Insufficient documentation

## 2022-01-01 ENCOUNTER — Telehealth (INDEPENDENT_AMBULATORY_CARE_PROVIDER_SITE_OTHER): Payer: BC Managed Care – PPO | Admitting: Nurse Practitioner

## 2022-01-01 ENCOUNTER — Other Ambulatory Visit: Payer: Self-pay

## 2022-01-01 ENCOUNTER — Encounter: Payer: Self-pay | Admitting: Nurse Practitioner

## 2022-01-01 ENCOUNTER — Telehealth: Payer: Self-pay

## 2022-01-01 DIAGNOSIS — J069 Acute upper respiratory infection, unspecified: Secondary | ICD-10-CM | POA: Diagnosis not present

## 2022-01-01 DIAGNOSIS — U071 COVID-19: Secondary | ICD-10-CM

## 2022-01-01 MED ORDER — MOLNUPIRAVIR EUA 200MG CAPSULE: 4.0000 | ORAL_CAPSULE | Freq: Two times a day (BID) | ORAL | 0 refills | Status: AC

## 2022-01-01 MED ORDER — PROMETHAZINE-DM 6.25-15 MG/5ML PO SYRP: 5.0000 mL | ORAL_SOLUTION | Freq: Four times a day (QID) | ORAL | 0 refills | Status: DC | PRN

## 2022-01-01 NOTE — Telephone Encounter (Signed)
Pt called in stating that after virtual visit with NP pt took an at home covid test that came back positive. Pt would like to know if NP would send some of the antiviral meds for covid to pt's pharmacy. Please advise.  Cb#: [phone]

## 2022-01-01 NOTE — Telephone Encounter (Signed)
Medication sent to pharmacy  

## 2022-01-01 NOTE — Progress Notes (Signed)
Subjective:    Patient ID: Sandra Parker, female    DOB: Nov 27, 1976, 46 y.o.   MRN: [id number]  HPI: Sandra Parker is a 46 y.o. female presenting virtually for sore throat and cough.  Chief Complaint  Patient presents with   Sore Throat   UPPER RESPIRATORY TRACT INFECTION Onset: Monday  COVID-19 testing history: at home test negative last night COVID-19 vaccination status: Fever: no Body aches: yes Chills: yes Cough: no Shortness of breath: no Wheezing: no Chest pain: yes, with cough Chest tightness: no Chest congestion: yes Nasal congestion: yes Runny nose: yes Post nasal drip: yes Sneezing: no Sore throat: yes Swollen glands: no Sinus pressure: no Headache: yes Face pain: no Toothache: no Ear pain: no  Ear pressure:  yes; bilaterally stopped up   Eyes red/itching:no Eye drainage/crusting: no  Nausea: no  Vomiting: no Diarrhea: no  Change in appetite:  yes; decreased   Loss of taste/smell: no  Rash: no Fatigue: yes Sick contacts:  yes; family sick at Christmas Strep contacts: no  Context: stable Recurrent sinusitis: no Treatments attempted: floanse, nyquil, ibuprofen Relief with OTC medications: no  Allergies  Allergen Reactions   Prozac [Fluoxetine] Other (See Comments)    HEADACHES   Wellbutrin [Bupropion] Other (See Comments)    NIGHTMARES   Cephalexin Rash   Ciprofloxacin Rash   Relafen [Nabumetone] Other (See Comments)    UNKNOWN    Outpatient Encounter Medications as of 01/01/2022  Medication Sig   ALPRAZolam (XANAX) 0.5 MG tablet TAKE ONE TABLET BY MOUTH 2 TIMES A DAY AS NEEDED   estradiol (ESTRACE) 0.1 MG/GM vaginal cream 1 g   fluticasone (FLONASE) 50 MCG/ACT nasal spray Place into both nostrils.   meclizine (ANTIVERT) 12.5 MG tablet Take 1 tablet (12.5 mg total) by mouth 3 (three) times daily as needed for dizziness.   pantoprazole (PROTONIX) 40 MG tablet Take 1 tablet (40 mg total) by mouth daily.   promethazine-dextromethorphan  (PROMETHAZINE-DM) 6.25-15 MG/5ML syrup Take 5 mLs by mouth 4 (four) times daily as needed for cough.   Rimegepant Sulfate 75 MG TBDP Take 75 mg by mouth.   rizatriptan (MAXALT-MLT) 10 MG disintegrating tablet Take 1 tablet (10 mg total) by mouth as needed for migraine. May repeat in 2 hours if needed   tretinoin (RETIN-A) 0.025 % gel Apply topically at bedtime.   valACYclovir (VALTREX) 1000 MG tablet Take 1 tablet (1,000 mg total) by mouth 2 (two) times daily. X 1 day   No facility-administered encounter medications on file as of 01/01/2022.    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   History of hysterectomy 10/22/2016   Acne vulgaris 08/28/2015   Insomnia 04/24/2015   Large breasts 09/12/2014   Mid back pain 09/12/2014   Tendinitis of forearm 08/28/2014   Surgical menopause on hormone replacement therapy 08/28/2014   Migraine headache 06/17/2014   Rotator cuff syndrome 04/28/2014   Overweight (BMI 25.0-29.9) 07/20/2013   Pain in joint, shoulder region 07/20/2013   Vertigo 06/19/2013   Anxiety    Depressive disorder     Past Medical History:  Diagnosis Date   Anxiety    Depression    GERD (gastroesophageal reflux disease)    Macromastia 10/2018   TMJ (temporomandibular joint syndrome)    Upper respiratory tract infection 11/29/2013    Relevant past medical, surgical, family and social history reviewed and updated as  indicated. Interim medical history since our last visit reviewed.  Review of Systems Per HPI unless specifically indicated above     Objective:    There were no vitals taken for this visit.  Wt Readings from Last 3 Encounters:  11/20/21 138 lb 3.2 oz (62.7 kg)  11/12/21 146 lb (66.2 kg)  09/24/21 138 lb 6.4 oz (62.8 kg)    Physical Exam Unable to perform physical examination due to lack of equipment.  Patient talking in complete sentences during  telemedicine visit with occasional cough.    Assessment & Plan:  1. Viral upper respiratory tract infection Acute x 3 days.  Suspect viral etiology - viral testing obtained.  Reassured patient that symptoms are most consistent with a viral upper respiratory infection and explained lack of efficacy of antibiotics against viruses.  Discussed expected course and features suggestive of secondary bacterial infection.  Continue supportive care. Increase fluid intake with water or electrolyte solution like pedialyte. Encouraged acetaminophen as needed for fever/pain. Encouraged salt water gargling, chloraseptic spray and throat lozenges. Encouraged OTC guaifenesin. Encouraged saline sinus flushes and/or neti with humidified air.  Start cough suppressant for dry cough - prescription sent to pharmacy.  Note given for work - to remain out of work and isolated until fever free.  Follow up if symptoms are not improving early next week.  With any sudden onset new chest pain, dizziness, sweating, or shortness of breath, go to ED.   - SARS-CoV-2 RNA (COVID-19) and Respiratory Viral Panel, Qualitative NAAT    Follow up plan: Return if symptoms worsen or fail to improve.  This visit was completed via telephone due to the restrictions of the COVID-19 pandemic. All issues as above were discussed and addressed but no physical exam was performed. If it was felt that the patient should be evaluated in the office, they were directed there. The patient verbally consented to this visit. Patient was unable to complete an audio/visual visit due to Technical difficulties.  Patient was able to connect to video visit but unable to get microphone working.   Location of the patient: parking lot Location of the provider: work Those involved with this call:  Provider: Noemi Chapel, DNP, FNP-C CMA: n/a Front Desk/Registration: Santina Evans  Time spent on call:  8 minutes on the phone discussing health concerns. 11 minutes  total spent in review of patient's record and preparation of their chart. I verified patient identity using two factors (patient name and date of birth). Patient consents verbally to being seen via telemedicine visit today.

## 2022-01-03 ENCOUNTER — Telehealth: Payer: Self-pay

## 2022-01-03 ENCOUNTER — Encounter: Payer: Self-pay | Admitting: Nurse Practitioner

## 2022-01-03 NOTE — Telephone Encounter (Signed)
Patient is requesting a letter in her MyChart for work stating she is positive for Covid.

## 2022-01-03 NOTE — Telephone Encounter (Signed)
Noted  

## 2022-01-03 NOTE — Telephone Encounter (Signed)
Letter sent.

## 2022-01-03 NOTE — Telephone Encounter (Signed)
Sandra Parker was informed that since she has been past the 5 days and as long as she does not have a fever in 48 hours she could go back to work. She has to wear a mask for 5 days.

## 2022-01-04 LAB — SARS-COV-2 RNA (COVID-19) RESP VIRAL PNL QL NAAT

## 2022-01-07 ENCOUNTER — Encounter: Payer: Self-pay | Admitting: Family Medicine

## 2022-01-17 ENCOUNTER — Encounter: Payer: Self-pay | Admitting: Nurse Practitioner

## 2022-01-17 ENCOUNTER — Ambulatory Visit: Payer: BC Managed Care – PPO | Admitting: Nurse Practitioner

## 2022-01-17 ENCOUNTER — Other Ambulatory Visit: Payer: Self-pay

## 2022-01-17 VITALS — BP 122/82 | HR 86 | Ht 61.0 in | Wt 138.0 lb

## 2022-01-17 DIAGNOSIS — Z13220 Encounter for screening for lipoid disorders: Secondary | ICD-10-CM | POA: Diagnosis not present

## 2022-01-17 DIAGNOSIS — Z1211 Encounter for screening for malignant neoplasm of colon: Secondary | ICD-10-CM

## 2022-01-17 DIAGNOSIS — L659 Nonscarring hair loss, unspecified: Secondary | ICD-10-CM

## 2022-01-17 DIAGNOSIS — K219 Gastro-esophageal reflux disease without esophagitis: Secondary | ICD-10-CM | POA: Diagnosis not present

## 2022-01-17 DIAGNOSIS — R748 Abnormal levels of other serum enzymes: Secondary | ICD-10-CM

## 2022-01-17 NOTE — Assessment & Plan Note (Signed)
Chronic.  Stable with daily PPI.  Discussed side effects including malabsorption of certain vitamins.  Encouraged she take daily PPI 30 minutes prior to daily multivitamin.  Will screen for anemia today with CBC, check electrolytes and kidney function, and Vitamin D level.  We discussed that she can start taking as needed or every other day to see if her symptoms are controlled with those methods.  Try to avoid foods that are triggers.  Follow up with new PCP.

## 2022-01-17 NOTE — Patient Instructions (Signed)
Call Dr. Encarnacion Slates office to schedule colonoscopy: [phone]

## 2022-01-17 NOTE — Progress Notes (Signed)
Subjective:    Patient ID: Sandra Parker, female    DOB: 1976/02/04, 46 y.o.   MRN: [id number]  HPI: Sandra Parker is a 46 y.o. female presenting for follow up of medications.  Chief Complaint  Patient presents with   Follow-up   GERD Currently taking Protonix 40 mg daily. Has been on for years.  If she misses a dose, has reflux in the middle of the night.  Has to take Tums only when she missed a dose. GERD control status: controlled Satisfied with current treatment? no Heartburn frequency: rarely Medication side effects: no  Dysphagia: no Odynophagia:  no Hematemesis: no Blood in stool: no EGD: no  She is also worried about hair thinning, requesting we check blood work today.  She has been referred for screening colonoscopy, however has yet to hear back.    Allergies  Allergen Reactions   Prozac [Fluoxetine] Other (See Comments)    HEADACHES   Wellbutrin [Bupropion] Other (See Comments)    NIGHTMARES   Cephalexin Rash   Ciprofloxacin Rash   Relafen [Nabumetone] Other (See Comments)    UNKNOWN    Outpatient Encounter Medications as of 01/17/2022  Medication Sig   ALPRAZolam (XANAX) 0.5 MG tablet TAKE ONE TABLET BY MOUTH 2 TIMES A DAY AS NEEDED   estradiol (ESTRACE) 0.1 MG/GM vaginal cream 1 g   fluticasone (FLONASE) 50 MCG/ACT nasal spray Place into both nostrils.   meclizine (ANTIVERT) 12.5 MG tablet Take 1 tablet (12.5 mg total) by mouth 3 (three) times daily as needed for dizziness.   pantoprazole (PROTONIX) 40 MG tablet Take 1 tablet (40 mg total) by mouth daily.   Rimegepant Sulfate 75 MG TBDP Take 75 mg by mouth.   rizatriptan (MAXALT-MLT) 10 MG disintegrating tablet Take 1 tablet (10 mg total) by mouth as needed for migraine. May repeat in 2 hours if needed   tretinoin (RETIN-A) 0.025 % gel Apply topically at bedtime.   valACYclovir (VALTREX) 1000 MG tablet Take 1 tablet (1,000 mg total) by mouth 2 (two) times daily. X 1 day   [DISCONTINUED]  promethazine-dextromethorphan (PROMETHAZINE-DM) 6.25-15 MG/5ML syrup Take 5 mLs by mouth 4 (four) times daily as needed for cough.   No facility-administered encounter medications on file as of 01/17/2022.    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   History of hysterectomy 10/22/2016   Acne vulgaris 08/28/2015   Insomnia 04/24/2015   Large breasts 09/12/2014   Mid back pain 09/12/2014   Tendinitis of forearm 08/28/2014   Surgical menopause on hormone replacement therapy 08/28/2014   Migraine headache 06/17/2014   Rotator cuff syndrome 04/28/2014   Overweight (BMI 25.0-29.9) 07/20/2013   Pain in joint, shoulder region 07/20/2013   Vertigo 06/19/2013   Anxiety    Depressive disorder     Past Medical History:  Diagnosis Date   Anxiety    Depression    GERD (gastroesophageal reflux disease)    Macromastia 10/2018   TMJ (temporomandibular joint syndrome)    Upper respiratory tract infection 11/29/2013    Relevant past medical, surgical, family and social history reviewed and updated as indicated. Interim medical history since our last visit reviewed.  Review of Systems Per HPI unless specifically indicated above     Objective:    BP 122/82    Pulse 86    Ht 5\' 1"  (1.549 m)    Wt 138 lb (  62.6 kg)    SpO2 100%    BMI 26.07 kg/m   Wt Readings from Last 3 Encounters:  01/17/22 138 lb (62.6 kg)  11/20/21 138 lb 3.2 oz (62.7 kg)  11/12/21 146 lb (66.2 kg)    Physical Exam Vitals and nursing note reviewed.  Constitutional:      General: She is not in acute distress.    Appearance: Normal appearance. She is not toxic-appearing.  HENT:     Head: Normocephalic and atraumatic.  Abdominal:     General: Abdomen is flat. Bowel sounds are normal. There is no distension.     Palpations: Abdomen is soft. There is no mass.     Tenderness: There is no abdominal tenderness.  There is no guarding.  Musculoskeletal:     Right lower leg: No edema.     Left lower leg: No edema.  Skin:    General: Skin is warm and dry.     Capillary Refill: Capillary refill takes less than 2 seconds.     Coloration: Skin is not jaundiced or pale.     Findings: No erythema.  Neurological:     Mental Status: She is alert and oriented to person, place, and time.     Motor: No weakness.     Gait: Gait normal.  Psychiatric:        Mood and Affect: Mood normal.        Behavior: Behavior normal.        Thought Content: Thought content normal.        Judgment: Judgment normal.      Assessment & Plan:   Problem List Items Addressed This Visit       Digestive   Gastroesophageal reflux disease without esophagitis - Primary    Chronic.  Stable with daily PPI.  Discussed side effects including malabsorption of certain vitamins.  Encouraged she take daily PPI 30 minutes prior to daily multivitamin.  Will screen for anemia today with CBC, check electrolytes and kidney function, and Vitamin D level.  We discussed that she can start taking as needed or every other day to see if her symptoms are controlled with those methods.  Try to avoid foods that are triggers.  Follow up with new PCP.      Relevant Orders   Lipid panel   COMPLETE METABOLIC PANEL WITH GFR   CBC with Differential/Platelet   TSH   Hemoglobin A1c   VITAMIN D 25 Hydroxy (Vit-D Deficiency, Fractures)   Other Visit Diagnoses     Hair thinning       No bald patches.  Check TSH today   Relevant Orders   Lipid panel   COMPLETE METABOLIC PANEL WITH GFR   CBC with Differential/Platelet   TSH   Hemoglobin A1c   VITAMIN D 25 Hydroxy (Vit-D Deficiency, Fractures)   Screening for lipid disorders       Nonfasting   Relevant Orders   Lipid panel   COMPLETE METABOLIC PANEL WITH GFR   CBC with Differential/Platelet   TSH   Hemoglobin A1c   VITAMIN D 25 Hydroxy (Vit-D Deficiency, Fractures)   Screening for colon cancer        Discussed options, she has been referred to [facility]; number given to contact and schedule appointment.        Follow up plan: Return for with new PCP.

## 2022-01-18 LAB — COMPLETE METABOLIC PANEL WITH GFR
AG Ratio: 1.5 (calc) (ref 1.0–2.5)
ALT: 89 U/L — ABNORMAL HIGH (ref 6–29)
AST: 59 U/L — ABNORMAL HIGH (ref 10–35)
Albumin: 4.4 g/dL (ref 3.6–5.1)
Alkaline phosphatase (APISO): 73 U/L (ref 31–125)
BUN: 14 mg/dL (ref 7–25)
CO2: 28 mmol/L (ref 20–32)
Calcium: 9.4 mg/dL (ref 8.6–10.2)
Chloride: 104 mmol/L (ref 98–110)
Creat: 0.91 mg/dL (ref 0.50–0.99)
Globulin: 2.9 g/dL (calc) (ref 1.9–3.7)
Glucose, Bld: 98 mg/dL (ref 65–99)
Potassium: 4 mmol/L (ref 3.5–5.3)
Sodium: 140 mmol/L (ref 135–146)
Total Bilirubin: 0.3 mg/dL (ref 0.2–1.2)
Total Protein: 7.3 g/dL (ref 6.1–8.1)
eGFR: 79 mL/min/{1.73_m2} (ref >=60)

## 2022-01-18 LAB — LIPID PANEL
Cholesterol: 199 mg/dL (ref <200)
HDL: 57 mg/dL (ref >=50)
LDL Cholesterol (Calc): 116 mg/dL (calc) — ABNORMAL HIGH
Non-HDL Cholesterol (Calc): 142 mg/dL (calc) — ABNORMAL HIGH (ref <130)
Total CHOL/HDL Ratio: 3.5 (calc) (ref <5.0)
Triglycerides: 147 mg/dL (ref <150)

## 2022-01-18 LAB — CBC WITH DIFFERENTIAL/PLATELET
Absolute Monocytes: 563 cells/uL (ref 200–950)
Basophils Absolute: 39 cells/uL (ref 0–200)
Basophils Relative: 0.4 %
Eosinophils Absolute: 252 cells/uL (ref 15–500)
Eosinophils Relative: 2.6 %
HCT: 36.7 % (ref 35.0–45.0)
Hemoglobin: 12.6 g/dL (ref 11.7–15.5)
Lymphs Abs: 2755 cells/uL (ref 850–3900)
MCH: 29.4 pg (ref 27.0–33.0)
MCHC: 34.3 g/dL (ref 32.0–36.0)
MCV: 85.5 fL (ref 80.0–100.0)
MPV: 11.2 fL (ref 7.5–12.5)
Monocytes Relative: 5.8 %
Neutro Abs: 6092 cells/uL (ref 1500–7800)
Neutrophils Relative %: 62.8 %
Platelets: 259 10*3/uL (ref 140–400)
RBC: 4.29 10*6/uL (ref 3.80–5.10)
RDW: 12.2 % (ref 11.0–15.0)
Total Lymphocyte: 28.4 %
WBC: 9.7 10*3/uL (ref 3.8–10.8)

## 2022-01-18 LAB — VITAMIN D 25 HYDROXY (VIT D DEFICIENCY, FRACTURES): Vit D, 25-Hydroxy: 40 ng/mL (ref 30–100)

## 2022-01-18 LAB — HEMOGLOBIN A1C
Hgb A1c MFr Bld: 5.6 % of total Hgb (ref <5.7)
Mean Plasma Glucose: 114 mg/dL
eAG (mmol/L): 6.3 mmol/L

## 2022-01-18 LAB — TSH: TSH: 0.67 mIU/L

## 2022-01-20 ENCOUNTER — Encounter: Payer: Self-pay | Admitting: Nurse Practitioner

## 2022-01-20 DIAGNOSIS — R748 Abnormal levels of other serum enzymes: Secondary | ICD-10-CM | POA: Insufficient documentation

## 2022-01-20 NOTE — Addendum Note (Signed)
Addended by: Noemi Chapel A on: 01/20/2022 05:12 PM   Modules accepted: Orders

## 2022-01-28 ENCOUNTER — Other Ambulatory Visit: Payer: Self-pay

## 2022-01-28 DIAGNOSIS — K219 Gastro-esophageal reflux disease without esophagitis: Secondary | ICD-10-CM

## 2022-01-28 MED ORDER — PANTOPRAZOLE SODIUM 40 MG PO TBEC: 40.0000 mg | DELAYED_RELEASE_TABLET | Freq: Every day | ORAL | 1 refills | Status: DC

## 2022-01-28 NOTE — Telephone Encounter (Signed)
Xanax refill request.  Last seen 12/2021, last filled 12/11/2021.

## 2022-01-28 NOTE — Telephone Encounter (Signed)
Pt called in requesting a refill of ALPRAZolam (XANAX) 0.5 MG tablet and pantoprazole (PROTONIX) 40 MG.  Cb#: [phone]

## 2022-01-29 MED ORDER — ALPRAZOLAM 0.5 MG PO TABS: 0.5000 mg | ORAL_TABLET | Freq: Two times a day (BID) | ORAL | 0 refills | Status: AC | PRN

## 2022-01-29 NOTE — Telephone Encounter (Signed)
PDMP reviewed; refill given

## 2022-02-01 ENCOUNTER — Other Ambulatory Visit: Payer: Self-pay | Admitting: Family Medicine

## 2022-02-03 ENCOUNTER — Other Ambulatory Visit: Payer: Self-pay

## 2022-02-03 NOTE — Telephone Encounter (Signed)
Left message for patient to clarify medication request.

## 2022-02-03 NOTE — Telephone Encounter (Signed)
It looks like she takes valcyclovir instead now; please confirm with patient

## 2022-02-07 DIAGNOSIS — M25512 Pain in left shoulder: Secondary | ICD-10-CM | POA: Insufficient documentation

## 2022-02-18 ENCOUNTER — Ambulatory Visit: Payer: BC Managed Care – PPO | Admitting: Family Medicine

## 2022-02-20 ENCOUNTER — Ambulatory Visit (HOSPITAL_COMMUNITY)
Admission: RE | Admit: 2022-02-20 | Discharge: 2022-02-20 | Disposition: A | Discharge status: Home / Self Care | Payer: BC Managed Care – PPO | Source: Ambulatory Visit | Attending: Family Medicine | Admitting: Family Medicine

## 2022-02-20 ENCOUNTER — Other Ambulatory Visit: Payer: Self-pay

## 2022-02-20 ENCOUNTER — Other Ambulatory Visit: Payer: Self-pay | Admitting: Family Medicine

## 2022-02-20 ENCOUNTER — Other Ambulatory Visit (HOSPITAL_COMMUNITY): Payer: Self-pay | Admitting: Family Medicine

## 2022-02-20 DIAGNOSIS — R319 Hematuria, unspecified: Secondary | ICD-10-CM

## 2022-02-25 ENCOUNTER — Encounter (INDEPENDENT_AMBULATORY_CARE_PROVIDER_SITE_OTHER): Payer: Self-pay | Admitting: *Deleted

## 2022-03-05 DIAGNOSIS — K602 Anal fissure, unspecified: Secondary | ICD-10-CM | POA: Insufficient documentation

## 2022-03-10 ENCOUNTER — Other Ambulatory Visit (INDEPENDENT_AMBULATORY_CARE_PROVIDER_SITE_OTHER): Payer: Self-pay

## 2022-03-10 DIAGNOSIS — Z1211 Encounter for screening for malignant neoplasm of colon: Secondary | ICD-10-CM

## 2022-04-03 ENCOUNTER — Ambulatory Visit: Payer: BC Managed Care – PPO | Admitting: Family

## 2022-04-21 ENCOUNTER — Telehealth (INDEPENDENT_AMBULATORY_CARE_PROVIDER_SITE_OTHER): Payer: Self-pay

## 2022-04-21 ENCOUNTER — Encounter (INDEPENDENT_AMBULATORY_CARE_PROVIDER_SITE_OTHER): Payer: Self-pay

## 2022-04-21 MED ORDER — PEG 3350-KCL-NA BICARB-NACL 420 G PO SOLR: 4000.0000 mL | ORAL | 0 refills | Status: DC

## 2022-04-21 NOTE — Telephone Encounter (Signed)
Ok to schedule.  Thanks,  Cattaleya Wien Castaneda Mayorga, MD Gastroenterology and Hepatology [facility]  

## 2022-04-21 NOTE — Telephone Encounter (Signed)
Yussuf Sawyers Ann Dannelle Rhymes, CMA  ?

## 2022-04-21 NOTE — Telephone Encounter (Signed)
Referring MD/PCP: Edsel Petrin ? ?Procedure: Tcs ? ?Reason/Indication:  Screening ? ?Has patient had this procedure before?  no ? If so, when, by whom and where?   ? ?Is there a family history of colon cancer?  no ? Who?  What age when diagnosed?   ? ?Is patient diabetic? If yes, Type 1 or Type 2   no ?     ?Does patient have prosthetic heart valve or mechanical valve?  no ? ?Do you have a pacemaker/defibrillator?  no ? ?Has patient ever had endocarditis/atrial fibrillation? no ? ?Does patient use oxygen? no ? ?Has patient had joint replacement within last 12 months?  no ? ?Is patient constipated or do they take laxatives? no ? ?Does patient have a history of alcohol/drug use?  no ? ?Have you had a stroke/heart attack last 6 mths? no ? ?Do you take medicine for weight loss?  no ? ?For female patients,: have you had a hysterectomy yes ?                     are you post menopausal no ?                     do you still have your menstrual cycle no ? ?Is patient on blood thinner such as Coumadin, Plavix and/or Aspirin? no ? ?Medications: protonix 40 mg every other day, MVI daily ? ?Allergies: nkda ? ?Medication Adjustment per Dr Jenetta Downer none ? ?Procedure date & time: 05/21/22 at 8:30 am  ? ? ?

## 2022-04-24 ENCOUNTER — Encounter (INDEPENDENT_AMBULATORY_CARE_PROVIDER_SITE_OTHER): Payer: Self-pay

## 2022-05-21 ENCOUNTER — Ambulatory Visit (HOSPITAL_COMMUNITY)
Admission: RE | Admit: 2022-05-21 | Discharge: 2022-05-21 | Disposition: A | Discharge status: Home / Self Care | Payer: BC Managed Care – PPO | Attending: Gastroenterology | Admitting: Gastroenterology

## 2022-05-21 ENCOUNTER — Encounter (HOSPITAL_COMMUNITY): Payer: Self-pay | Admitting: Gastroenterology

## 2022-05-21 ENCOUNTER — Telehealth (INDEPENDENT_AMBULATORY_CARE_PROVIDER_SITE_OTHER): Payer: Self-pay

## 2022-05-21 ENCOUNTER — Ambulatory Visit (HOSPITAL_COMMUNITY): Payer: BC Managed Care – PPO | Admitting: Anesthesiology

## 2022-05-21 ENCOUNTER — Telehealth: Payer: Self-pay | Admitting: Genetic Counselor

## 2022-05-21 ENCOUNTER — Other Ambulatory Visit: Payer: Self-pay

## 2022-05-21 ENCOUNTER — Encounter (HOSPITAL_COMMUNITY)
Admission: RE | Disposition: A | Discharge status: Home / Self Care | Payer: Self-pay | Source: Home / Self Care | Attending: Gastroenterology

## 2022-05-21 DIAGNOSIS — K219 Gastro-esophageal reflux disease without esophagitis: Secondary | ICD-10-CM | POA: Insufficient documentation

## 2022-05-21 DIAGNOSIS — Z1211 Encounter for screening for malignant neoplasm of colon: Secondary | ICD-10-CM | POA: Insufficient documentation

## 2022-05-21 DIAGNOSIS — K621 Rectal polyp: Secondary | ICD-10-CM | POA: Diagnosis not present

## 2022-05-21 DIAGNOSIS — M778 Other enthesopathies, not elsewhere classified: Secondary | ICD-10-CM

## 2022-05-21 DIAGNOSIS — F32A Depression, unspecified: Secondary | ICD-10-CM | POA: Insufficient documentation

## 2022-05-21 DIAGNOSIS — D123 Benign neoplasm of transverse colon: Secondary | ICD-10-CM | POA: Diagnosis not present

## 2022-05-21 DIAGNOSIS — G4700 Insomnia, unspecified: Secondary | ICD-10-CM

## 2022-05-21 DIAGNOSIS — F419 Anxiety disorder, unspecified: Secondary | ICD-10-CM | POA: Diagnosis not present

## 2022-05-21 DIAGNOSIS — D125 Benign neoplasm of sigmoid colon: Secondary | ICD-10-CM | POA: Insufficient documentation

## 2022-05-21 DIAGNOSIS — M75100 Unspecified rotator cuff tear or rupture of unspecified shoulder, not specified as traumatic: Secondary | ICD-10-CM

## 2022-05-21 DIAGNOSIS — K5730 Diverticulosis of large intestine without perforation or abscess without bleeding: Secondary | ICD-10-CM | POA: Diagnosis not present

## 2022-05-21 DIAGNOSIS — R42 Dizziness and giddiness: Secondary | ICD-10-CM

## 2022-05-21 DIAGNOSIS — B001 Herpesviral vesicular dermatitis: Secondary | ICD-10-CM

## 2022-05-21 DIAGNOSIS — E8940 Asymptomatic postprocedural ovarian failure: Secondary | ICD-10-CM

## 2022-05-21 DIAGNOSIS — M26609 Unspecified temporomandibular joint disorder, unspecified side: Secondary | ICD-10-CM | POA: Diagnosis not present

## 2022-05-21 DIAGNOSIS — K635 Polyp of colon: Secondary | ICD-10-CM

## 2022-05-21 DIAGNOSIS — R748 Abnormal levels of other serum enzymes: Secondary | ICD-10-CM

## 2022-05-21 DIAGNOSIS — L700 Acne vulgaris: Secondary | ICD-10-CM

## 2022-05-21 DIAGNOSIS — M549 Dorsalgia, unspecified: Secondary | ICD-10-CM

## 2022-05-21 DIAGNOSIS — Z90710 Acquired absence of both cervix and uterus: Secondary | ICD-10-CM

## 2022-05-21 DIAGNOSIS — G43909 Migraine, unspecified, not intractable, without status migrainosus: Secondary | ICD-10-CM

## 2022-05-21 DIAGNOSIS — N62 Hypertrophy of breast: Secondary | ICD-10-CM

## 2022-05-21 DIAGNOSIS — E663 Overweight: Secondary | ICD-10-CM

## 2022-05-21 DIAGNOSIS — M25519 Pain in unspecified shoulder: Secondary | ICD-10-CM

## 2022-05-21 HISTORY — PX: POLYPECTOMY: SHX149

## 2022-05-21 HISTORY — PX: COLONOSCOPY WITH PROPOFOL: SHX5780

## 2022-05-21 LAB — HM COLONOSCOPY

## 2022-05-21 SURGERY — COLONOSCOPY WITH PROPOFOL
Anesthesia: General

## 2022-05-21 MED ORDER — PROPOFOL 500 MG/50ML IV EMUL
INTRAVENOUS | Status: AC
  Filled 2022-05-21: qty 50

## 2022-05-21 MED ORDER — EPHEDRINE SULFATE (PRESSORS) 50 MG/ML IJ SOLN
INTRAMUSCULAR | Status: DC | PRN
  Administered 2022-05-21 (×2): 10 mg via INTRAVENOUS

## 2022-05-21 MED ORDER — LIDOCAINE HCL (PF) 2 % IJ SOLN
INTRAMUSCULAR | Status: AC
  Filled 2022-05-21: qty 5

## 2022-05-21 MED ORDER — EPHEDRINE 5 MG/ML INJ
INTRAVENOUS | Status: AC
  Filled 2022-05-21: qty 5

## 2022-05-21 MED ORDER — PROPOFOL 500 MG/50ML IV EMUL
INTRAVENOUS | Status: DC | PRN
  Administered 2022-05-21: 150 ug/kg/min via INTRAVENOUS

## 2022-05-21 MED ORDER — LIDOCAINE HCL (CARDIAC) PF 100 MG/5ML IV SOSY
PREFILLED_SYRINGE | INTRAVENOUS | Status: DC | PRN
  Administered 2022-05-21: 50 mg via INTRAVENOUS

## 2022-05-21 MED ORDER — PHENYLEPHRINE 80 MCG/ML (10ML) SYRINGE FOR IV PUSH (FOR BLOOD PRESSURE SUPPORT)
PREFILLED_SYRINGE | INTRAVENOUS | Status: DC | PRN
  Administered 2022-05-21 (×2): 80 ug via INTRAVENOUS

## 2022-05-21 MED ORDER — LACTATED RINGERS IV SOLN: INTRAVENOUS | Status: DC

## 2022-05-21 MED ORDER — GLYCOPYRROLATE 0.2 MG/ML IJ SOLN
INTRAMUSCULAR | Status: DC | PRN
  Administered 2022-05-21: .2 mg via INTRAVENOUS

## 2022-05-21 NOTE — Telephone Encounter (Signed)
I spoke with the patient and made her aware that the office would be taking care of this and she should be hearing back from [place] or the genetic counseling clinic.

## 2022-05-21 NOTE — Anesthesia Postprocedure Evaluation (Signed)
Anesthesia Post Note  Patient: Sandra Parker  Procedure(s) Performed: COLONOSCOPY WITH PROPOFOL POLYPECTOMY INTESTINAL  Patient location during evaluation: Phase II Anesthesia Type: General Level of consciousness: awake and alert and oriented Pain management: pain level controlled Vital Signs Assessment: post-procedure vital signs reviewed and stable Respiratory status: spontaneous breathing, nonlabored ventilation and respiratory function stable Cardiovascular status: blood pressure returned to baseline and stable Postop Assessment: no apparent nausea or vomiting Anesthetic complications: no   No notable events documented.   Last Vitals:  Vitals:   05/21/22 0722 05/21/22 0920  BP: 118/76 101/70  Pulse: 71 94  Resp: 16 17  Temp: 36.5 C 36.6 C  SpO2: 100% 100%    Last Pain:  Vitals:   05/21/22 0920  TempSrc: Oral  PainSc: 0-No pain                 [name]

## 2022-05-21 NOTE — Discharge Instructions (Signed)
You are being discharged to home.  Resume your previous diet.  We are waiting for your pathology results.  Your physician has recommended a repeat colonoscopy in one year for surveillance.  Referral to genetics clinic for genetic counseling due to colonic polyposis

## 2022-05-21 NOTE — Op Note (Signed)
[facility] Patient Name: Sandra Parker Procedure Date: 05/21/2022 7:22 AM MRN: [id number] Date of Birth: 1976/11/22 Attending MD: Maylon Peppers ,  CSN: [id number] Age: 46 Admit Type: Outpatient Procedure:                Colonoscopy Indications:              Screening for colorectal malignant neoplasm Providers:                Maylon Peppers, [place]stone Page, [place] Risa Grill, Technician, Tammy Vaught, RN, Bonnetta Barry,                            Technician Referring MD:              Medicines:                Monitored Anesthesia Care Complications:            No immediate complications. Estimated Blood Loss:     Estimated blood loss: none. Procedure:                Pre-Anesthesia Assessment:                           - Prior to the procedure, a History and Physical                            was performed, and patient medications, allergies                            and sensitivities were reviewed. The patient's                            tolerance of previous anesthesia was reviewed.                           - The risks and benefits of the procedure and the                            sedation options and risks were discussed with the                            patient. All questions were answered and informed                            consent was obtained.                           - ASA Grade Assessment: II - A patient with mild                            systemic disease.                           After obtaining informed consent, the colonoscope  was passed under direct vision. Throughout the                            procedure, the patient's blood pressure, pulse, and                            oxygen saturations were monitored continuously. The                            [id number]-[id number] ([id number]) was introduced through the                            anus and advanced to the the cecum, identified by                             appendiceal orifice and ileocecal valve. The                            colonoscopy was performed without difficulty. The                            patient tolerated the procedure well. The quality                            of the bowel preparation was excellent. Scope In: 8:17:18 AM Scope Out: 9:14:21 AM Scope Withdrawal Time: 0 hours 53 minutes 14 seconds  Total Procedure Duration: 0 hours 57 minutes 3 seconds  Findings:      The perianal and digital rectal examinations were normal.      Twenty sessile polyps were found in the ascending colon and cecum. The       polyps were 4 to 12 mm in size. These polyps were removed with a cold       snare. Resection and retrieval were complete.      Two sessile polyps were found in the ascending colon and cecum. The       polyps were 1 to 2 mm in size. These polyps were removed with a cold       biopsy forceps. Resection and retrieval were complete.      A 12 mm polyp was found in the ascending colon. The polyp was       semi-sessile. The polyp was removed with a hot snare. Resection and       retrieval were complete.      Three sessile polyps were found in the transverse colon. The polyps were       5 to 6 mm in size. These polyps were removed with a cold snare.       Resection and retrieval were complete.      Two sessile polyps were found in the rectum and sigmoid colon. The       polyps were 4 to 5 mm in size. These polyps were removed with a cold       snare. Resection and retrieval were complete.      Scattered medium-mouthed diverticula were found in the transverse colon       and ascending colon.      The retroflexed view of the distal rectum and anal verge  was normal and       showed no anal or rectal abnormalities. Impression:               - Twenty 4 to 12 mm polyps in the ascending colon                            and in the cecum, removed with a cold snare.                            Resected and retrieved.                            - Two 1 to 2 mm polyps in the ascending colon and                            in the cecum, removed with a cold biopsy forceps.                            Resected and retrieved.                           - One 12 mm polyp in the ascending colon, removed                            with a hot snare. Resected and retrieved.                           - Three 5 to 6 mm polyps in the transverse colon,                            removed with a cold snare. Resected and retrieved.                           - Two 4 to 5 mm polyps in the rectum and in the                            sigmoid colon, removed with a cold snare. Resected                            and retrieved.                           - Diverticulosis in the transverse colon and in the                            ascending colon.                           - The distal rectum and anal verge are normal on                            retroflexion view. Moderate Sedation:      Per Anesthesia Care Recommendation:           -  Discharge patient to home (ambulatory).                           - Resume previous diet.                           - Await pathology results.                           - Repeat colonoscopy in 1 year for surveillance.                           - Referral to genetics clinic for genetic                            counseling due to colonic polyposis Procedure Code(s):        --- Professional ---                           [id number], Colonoscopy, flexible; with removal of                            tumor(s), polyp(s), or other lesion(s) by snare                            technique                           45380, 59, Colonoscopy, flexible; with biopsy,                            single or multiple Diagnosis Code(s):        --- Professional ---                           Z12.11, Encounter for screening for malignant                            neoplasm of colon                           K62.1, Rectal polyp                            K63.5, Polyp of colon                           K57.30, Diverticulosis of large intestine without                            perforation or abscess without bleeding CPT copyright 2019 American Medical Association. All rights reserved. The codes documented in this report are preliminary and upon coder review may  be revised to meet current compliance requirements. Maylon Peppers, MD Maylon Peppers,  05/21/2022 9:23:40 AM This report has been signed electronically. Number of Addenda: 0

## 2022-05-21 NOTE — Final Progress Note (Signed)
Please excuse Sandra Parker from work on 05/21/2022 as she was seen at [facility] for a procedure. She may return to work without restriction on 05/22/22.  Thank you   Janeece Riggers  RN

## 2022-05-21 NOTE — Telephone Encounter (Signed)
Scheduled appt per 5/24 referral. Pt is aware of appt date and time. Pt is aware to arrive 15 mins prior to appt time and to bring and updated insurance card. Pt is aware of appt location.   

## 2022-05-21 NOTE — H&P (Signed)
Sandra Parker is an 46 y.o. female.   Chief Complaint: CRC screening HPI: 46 y/o F with PMH anxiety. GERD, depression, TMJ, coming for screening colonoscopy. The patient has never had a colonoscopy in the past.  The patient denies having any complaints such as melena, hematochezia, abdominal pain or distention, change in her bowel movement consistency or frequency, no changes in her weight recently.  No family history of colorectal cancer.   Past Medical History:  Diagnosis Date   Anxiety    Depression    GERD (gastroesophageal reflux disease)    Macromastia 10/2018   TMJ (temporomandibular joint syndrome)    Upper respiratory tract infection 11/29/2013    Past Surgical History:  Procedure Laterality Date   APPENDECTOMY     BREAST REDUCTION SURGERY Bilateral 11/22/2018   Procedure: BREAST REDUCTION WITH LIPOSUCTION;  Surgeon: Cristine Polio, MD;  Location: [facility];  Service: Plastics;  Laterality: Bilateral;  Bilateral    HYSTERECTOMY ABDOMINAL WITH SALPINGO-OOPHORECTOMY Left 04/15/2002   HYSTEROSCOPY WITH D & C  08/02/2001   LAPAROSCOPIC CHOLECYSTECTOMY  08/16/2010   REDUCTION MAMMAPLASTY Bilateral    bilateral breast reduction nov 2019    Family History  Problem Relation Age of Onset   Healthy Mother    Healthy Father    Colon cancer Neg Hx    Social History:  reports that she has never smoked. She has never used smokeless tobacco. She reports that she does not currently use alcohol. She reports that she does not use drugs.  Allergies:  Allergies  Allergen Reactions   Prozac [Fluoxetine] Other (See Comments)    HEADACHES   Wellbutrin [Bupropion] Other (See Comments)    NIGHTMARES   Cephalexin Rash   Ciprofloxacin Rash   Relafen [Nabumetone] Other (See Comments)    UNKNOWN    Medications Prior to Admission  Medication Sig Dispense Refill   acyclovir (ZOVIRAX) 400 MG tablet Take 1 tablet (400 mg total) by mouth 2 (two) times daily. (Patient taking  differently: Take 400 mg by mouth 2 (two) times daily as needed (cold sores).) 180 tablet 2   ALPRAZolam (XANAX) 0.5 MG tablet Take 1 tablet (0.5 mg total) by mouth 2 (two) times daily as needed for anxiety. 30 tablet 0   amphetamine-dextroamphetamine (ADDERALL XR) 10 MG 24 hr capsule Take 10 mg by mouth every morning.     cycloSPORINE (RESTASIS) 0.05 % ophthalmic emulsion Place 1 drop into both eyes 2 (two) times daily.     DOTTI 0.05 MG/24HR patch Place 1 patch onto the skin 2 (two) times a week.     fluticasone (FLONASE) 50 MCG/ACT nasal spray Place 1 spray into both nostrils daily as needed for allergies.     Multiple Vitamins-Calcium (ONE-A-DAY WOMENS FORMULA PO) Take 1 drop by mouth daily.     pantoprazole (PROTONIX) 40 MG tablet Take 1 tablet (40 mg total) by mouth daily. (Patient taking differently: Take 40 mg by mouth every other day.) 90 tablet 1   polyethylene glycol-electrolytes (TRILYTE) 420 g solution Take 4,000 mLs by mouth as directed. 4000 mL 0   tretinoin (RETIN-A) 0.025 % gel Apply topically at bedtime. 45 g 0   VAGIFEM 10 MCG TABS vaginal tablet Place 1 tablet vaginally at bedtime.     meclizine (ANTIVERT) 12.5 MG tablet Take 1 tablet (12.5 mg total) by mouth 3 (three) times daily as needed for dizziness. 20 tablet 0   Rimegepant Sulfate 75 MG TBDP Take 75 mg by mouth daily as  needed (Migraines).      No results found for this or any previous visit (from the past 48 hour(s)). No results found.  Review of Systems  Constitutional: Negative.   HENT: Negative.    Eyes: Negative.   Respiratory: Negative.    Cardiovascular: Negative.   Gastrointestinal: Negative.   Endocrine: Negative.   Genitourinary: Negative.   Musculoskeletal: Negative.   Skin: Negative.   Allergic/Immunologic: Negative.   Neurological: Negative.   Hematological: Negative.   Psychiatric/Behavioral: Negative.     Blood pressure 118/76, pulse 71, temperature 97.7 F (36.5 C), temperature source  Oral, resp. rate 16, height '5\' 1"'$  (1.549 m), weight 63.5 kg, SpO2 100 %. Physical Exam  GENERAL: The patient is AO x3, in no acute distress. HEENT: Head is normocephalic and atraumatic. EOMI are intact. Mouth is well hydrated and without lesions. NECK: Supple. No masses LUNGS: Clear to auscultation. No presence of rhonchi/wheezing/rales. Adequate chest expansion HEART: RRR, normal s1 and s2. ABDOMEN: Soft, nontender, no guarding, no peritoneal signs, and nondistended. BS +. No masses. EXTREMITIES: Without any cyanosis, clubbing, rash, lesions or edema. NEUROLOGIC: AOx3, no focal motor deficit. SKIN: no jaundice, no rashes  Assessment/Plan 46 y/o F with PMH anxiety. GERD, depression, TMJ, coming for screening colonoscopy. The patient is at average risk for colorectal cancer.  We will proceed with colonoscopy today.   Harvel Quale, MD 05/21/2022, 7:51 AM

## 2022-05-21 NOTE — Anesthesia Preprocedure Evaluation (Addendum)
Anesthesia Evaluation  Patient identified by MRN, date of birth, ID band Patient awake    Reviewed: Allergy & Precautions, NPO status , Patient's Chart, lab work & pertinent test results  Airway Mallampati: II  TM Distance: >3 FB Neck ROM: Full   Comment: TMJ dislocation Dental  (+) Dental Advisory Given, Missing   Pulmonary neg pulmonary ROS,    Pulmonary exam normal breath sounds clear to auscultation       Cardiovascular negative cardio ROS Normal cardiovascular exam Rhythm:Regular Rate:Normal     Neuro/Psych  Headaches, PSYCHIATRIC DISORDERS Anxiety Depression    GI/Hepatic Neg liver ROS, GERD  Medicated and Controlled,  Endo/Other  negative endocrine ROS  Renal/GU negative Renal ROS  negative genitourinary   Musculoskeletal Back pain   Abdominal   Peds negative pediatric ROS (+)  Hematology negative hematology ROS (+)   Anesthesia Other Findings TMJ Dislocation Vertigo   Reproductive/Obstetrics negative OB ROS                            Anesthesia Physical Anesthesia Plan  ASA: 2  Anesthesia Plan: General   Post-op Pain Management: Minimal or no pain anticipated   Induction: Intravenous  PONV Risk Score and Plan: Propofol infusion  Airway Management Planned: Nasal Cannula and Natural Airway  Additional Equipment:   Intra-op Plan:   Post-operative Plan:   Informed Consent: I have reviewed the patients History and Physical, chart, labs and discussed the procedure including the risks, benefits and alternatives for the proposed anesthesia with the patient or authorized representative who has indicated his/her understanding and acceptance.     Dental advisory given  Plan Discussed with: CRNA and Surgeon  Anesthesia Plan Comments:         Anesthesia Quick Evaluation

## 2022-05-21 NOTE — Transfer of Care (Signed)
Immediate Anesthesia Transfer of Care Note  Patient: Sandra Parker  Procedure(s) Performed: COLONOSCOPY WITH PROPOFOL POLYPECTOMY INTESTINAL  Patient Location: PACU and Endoscopy Unit  Anesthesia Type:General  Level of Consciousness: awake  Airway & Oxygen Therapy: Patient Spontanous Breathing  Post-op Assessment: Report given to RN  Post vital signs: Reviewed and stable  Last Vitals:  Vitals Value Taken Time  BP    Temp    Pulse    Resp    SpO2      Last Pain:  Vitals:   05/21/22 0813  TempSrc:   PainSc: 0-No pain      Patients Stated Pain Goal: 6 (15/05/69 7948)  Complications: No notable events documented.

## 2022-05-21 NOTE — Telephone Encounter (Signed)
Referral has been placed. 

## 2022-05-21 NOTE — Telephone Encounter (Signed)
We will refer her to the genetic counseling clinic

## 2022-05-21 NOTE — Telephone Encounter (Signed)
Patient calling today as she was recently discharged from hospital and discharge papers states she needed to see a genetic testing. She wanted to know if she was supposed to set this up, or was the hospital or the office supposed to do this? Please advise.

## 2022-05-22 ENCOUNTER — Telehealth (INDEPENDENT_AMBULATORY_CARE_PROVIDER_SITE_OTHER): Payer: Self-pay | Admitting: *Deleted

## 2022-05-22 ENCOUNTER — Encounter (INDEPENDENT_AMBULATORY_CARE_PROVIDER_SITE_OTHER): Payer: Self-pay | Admitting: *Deleted

## 2022-05-22 ENCOUNTER — Inpatient Hospital Stay: Payer: BC Managed Care – PPO | Attending: Genetic Counselor | Admitting: Genetic Counselor

## 2022-05-22 ENCOUNTER — Other Ambulatory Visit: Payer: Self-pay | Admitting: Genetic Counselor

## 2022-05-22 ENCOUNTER — Inpatient Hospital Stay: Payer: BC Managed Care – PPO

## 2022-05-22 DIAGNOSIS — Z860100 Personal history of colon polyps, unspecified: Secondary | ICD-10-CM

## 2022-05-22 LAB — SURGICAL PATHOLOGY

## 2022-05-22 LAB — GENETIC SCREENING ORDER

## 2022-05-22 NOTE — Telephone Encounter (Signed)
Called patient and addressed question, will follow genetic testing result

## 2022-05-22 NOTE — Telephone Encounter (Signed)
Patient LM - she received message a MyChart message about a result but it won't let he open it - wants some one to call her please  [phone]

## 2022-05-23 ENCOUNTER — Encounter: Payer: Self-pay | Admitting: Genetic Counselor

## 2022-05-23 DIAGNOSIS — Z860100 Personal history of colon polyps, unspecified: Secondary | ICD-10-CM | POA: Insufficient documentation

## 2022-05-23 HISTORY — DX: Personal history of colon polyps, unspecified: Z86.0100

## 2022-05-23 HISTORY — DX: Personal history of colonic polyps: Z86.010

## 2022-05-23 NOTE — Progress Notes (Signed)
REFERRING PROVIDER: Harvel Quale, [address] [place],  [place] [address]  PRIMARY PROVIDER:  Celene Squibb, MD  PRIMARY REASON FOR VISIT:  1. Personal history of colonic polyps     HISTORY OF PRESENT ILLNESS:   Sandra Parker, a 46 y.o. female, was seen for a [facility] cancer genetics consultation at the request of Dr. Montez Morita due to a personal history of colon polyps.  Ms. Bolon presents to clinic today to discuss the possibility of a hereditary predisposition to cancer, to discuss genetic testing, and to further clarify her future cancer risks, as well as potential cancer risks for family members.   In May 2023, at the age of 66, Ms. Quizon was found to have 27 tubular adenomas and one hyperplastic polyp by colonoscopy.   CANCER HISTORY:  Oncology History   No history exists.     RISK FACTORS:  Mammogram within the last year: yes Colonoscopy: yes;  see history noted above; 1 year f/u planned . Hysterectomy: yes at age 89 due to uterine polyps Ovaries intact: no; removed at age 25 Menarche was at age 65.  First live birth at age 11.  OCP use for less than 1 year.  HRT use: yes; since TAH/BSO in 20s  Past Medical History:  Diagnosis Date   Anxiety    Depression    GERD (gastroesophageal reflux disease)    Macromastia 10/2018   Personal history of colonic polyps 05/23/2022   TMJ (temporomandibular joint syndrome)    Upper respiratory tract infection 11/29/2013    Past Surgical History:  Procedure Laterality Date   APPENDECTOMY     BREAST REDUCTION SURGERY Bilateral 11/22/2018   Procedure: BREAST REDUCTION WITH LIPOSUCTION;  Surgeon: Cristine Polio, MD;  Location: [facility];  Service: Plastics;  Laterality: Bilateral;  Bilateral    HYSTERECTOMY ABDOMINAL WITH SALPINGO-OOPHORECTOMY Left 04/15/2002   HYSTEROSCOPY WITH D & C  08/02/2001   LAPAROSCOPIC CHOLECYSTECTOMY  08/16/2010   REDUCTION MAMMAPLASTY Bilateral     bilateral breast reduction nov 2019    FAMILY HISTORY:  We obtained a detailed, 4-generation family history.  Significant diagnoses are listed below: Family History  Problem Relation Age of Onset   Leukemia Maternal Uncle        d. > 4     Ms. Tang is unaware of previous family history of genetic testing for hereditary cancer risks.  There is no reported Ashkenazi Jewish ancestry. There is no known consanguinity.  GENETIC COUNSELING ASSESSMENT: Ms. Chauca is a 46 y.o. female with a personal history of colon polyps which is somewhat suggestive of a hereditary polyposis syndrome. We, therefore, discussed and recommended the following at today's visit.   DISCUSSION:We discussed that polyps in general are common, however, most people have fewer than 5 lifetime polyps.  When an individual has 10 or more polyps we become concerned about an underlying polyposis syndrome.  The most common hereditary polyposis syndromes are Familial Adenomatous Polyposis (FAP), caused by mutations in the APC gene, and MUTYH-Associated Polyposis (MAP), caused by mutations in the MUTYH gene.  There are other genes that are associated with polyposis. We discussed that testing is beneficial for several reasons, including knowing about cancer risks, identifying potential screening and risk-reduction options that may be appropriate, and to understand if other family members could be at risk for colon polyps and/or cancer and allow them to undergo genetic testing.   We reviewed the characteristics, features and inheritance patterns of hereditary  cancer syndromes. We also discussed genetic testing, including the appropriate family members to test, the process of testing, insurance coverage and turn-around-time for results. We discussed the implications of a negative, positive and/or variant of uncertain significant result. We recommended Ms. Riede pursue genetic testing for the [facility] CancerNext-Expanded +RNAinsight Panel.   The  CancerNext-Expanded gene panel offered by [facility] and includes sequencing, rearrangement, and RNA analysis for the following 77 genes: AIP, ALK, APC, ATM, AXIN2, BAP1, BARD1, BLM, BMPR1A, BRCA1, BRCA2, BRIP1, CDC73, CDH1, CDK4, CDKN1B, CDKN2A, CHEK2, CTNNA1, DICER1, FANCC, FH, FLCN, GALNT12, KIF1B, LZTR1, MAX, MEN1, MET, MLH1, MSH2, MSH3, MSH6, MUTYH, NBN, NF1, NF2, NTHL1, PALB2, PHOX2B, PMS2, POT1, PRKAR1A, PTCH1, PTEN, RAD51C, RAD51D, RB1, RECQL, RET, SDHA, SDHAF2, SDHB, SDHC, SDHD, SMAD4, SMARCA4, SMARCB1, SMARCE1, STK11, SUFU, TMEM127, TP53, TSC1, TSC2, VHL and XRCC2 (sequencing and deletion/duplication); EGFR, EGLN1, HOXB13, KIT, MITF, PDGFRA, POLD1, and POLE (sequencing only); EPCAM and GREM1 (deletion/duplication only).   Based on Ms. Elsen's personal history of more than 20 tubular adenomas, she meets medical criteria for genetic testing. Despite that she meets criteria, she may still have an out of pocket cost. We discussed that if her out of pocket cost for testing is over $100, the laboratory should contact her to discuss self-pay prices and/or patient pay assistance programs.   We discussed that some people do not want to undergo genetic testing due to fear of genetic discrimination.  A federal law called the Genetic Information Non-Discrimination Act (GINA) of 2008 helps protect individuals against genetic discrimination based on their genetic test results.  It impacts both health insurance and employment.  With health insurance, it protects against increased premiums, being kicked off insurance or being forced to take a test in order to be insured.  For employment it protects against hiring, firing and promoting decisions based on genetic test results.  GINA does not apply to those in the [employer], those who work for companies with less than 15 employees, and new life insurance or long-term disability insurance policies.  Health status due to a cancer diagnosis is not protected under  GINA.  PLAN: After considering the risks, benefits, and limitations, Ms. Privott provided informed consent to pursue genetic testing and the blood sample was sent to [facility] for analysis of the CancerNext-Expanded +RNAinsight Panel. Results should be available within approximately 3 weeks' time, at which point they will be disclosed by telephone to Ms. Love, as will any additional recommendations warranted by these results. Ms. Hakanson will receive a summary of her genetic counseling visit and a copy of her results once available. This information will also be available in Epic.   Ms. Joyner questions were answered to her satisfaction today. Our contact information was provided should additional questions or concerns arise. Thank you for the referral and allowing [place] to share in the care of your patient.   Laela Deviney M. Joette Catching, [place], [facility] Genetic Counselor [email or web address] (P) [phone]   The patient was seen for a total of 30 minutes in face-to-face genetic counseling.  The patient was seen alone.  Drs. Lindi Adie and/or Burr Medico were available to discuss this case as needed.  _______________________________________________________________________ For Office Staff:  Number of people involved in session: 1 Was an Intern/ student involved with case: no

## 2022-05-28 ENCOUNTER — Encounter (HOSPITAL_COMMUNITY): Payer: Self-pay | Admitting: Gastroenterology

## 2022-05-28 ENCOUNTER — Telehealth: Payer: Self-pay | Admitting: *Deleted

## 2022-05-28 NOTE — Telephone Encounter (Signed)
Results are not available to review yet.  Per the genetic counselor: "Results should be available within approximately 3 weeks' time, at which point they will be disclosed by telephone to Sandra Parker, as will any additional recommendations warranted by these results. Sandra Parker will receive a summary of her genetic counseling visit and a copy of her results once available. This information will also be available in Epic".  She will be reached by the genetic counselor to discuss the results

## 2022-05-28 NOTE — Telephone Encounter (Signed)
Patient calling to get results of genetic testing. She saw on mychart that results were available but she could not open message.   [phone]

## 2022-05-28 NOTE — Telephone Encounter (Signed)
Left message to return call 

## 2022-05-28 NOTE — Telephone Encounter (Signed)
Discussed with patient

## 2022-06-10 ENCOUNTER — Telehealth (INDEPENDENT_AMBULATORY_CARE_PROVIDER_SITE_OTHER): Payer: Self-pay | Admitting: Gastroenterology

## 2022-06-10 ENCOUNTER — Telehealth: Payer: Self-pay | Admitting: Genetic Counselor

## 2022-06-10 ENCOUNTER — Encounter: Payer: Self-pay | Admitting: Genetic Counselor

## 2022-06-10 DIAGNOSIS — Z1379 Encounter for other screening for genetic and chromosomal anomalies: Secondary | ICD-10-CM | POA: Insufficient documentation

## 2022-06-10 DIAGNOSIS — Z1509 Genetic susceptibility to other malignant neoplasm: Secondary | ICD-10-CM | POA: Insufficient documentation

## 2022-06-10 NOTE — Telephone Encounter (Signed)
Contacted patient in attempt to disclose results of genetic testing.  LVM with contact information requesting a call back.  

## 2022-06-10 NOTE — Telephone Encounter (Signed)
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  We will continue with the plan of yearly colonoscopy for now but I advised her about the importance of performing esophagogastroduodenospy for visualization of the ampulla Vater.  She agreed and understood.  Darius Bump, can you please schedule an EGD? Dx: MUTYH-associated polyposis. Room: 1  Thanks,  Maylon Peppers, MD Gastroenterology and Hepatology [facility]

## 2022-06-10 NOTE — Telephone Encounter (Signed)
Revealed genetic testing results consistent with MUTYH-associated polyposis.  Briefly discussed cancer risks, management, and family implications.  F/u appt scheduled 6/22 at 3pm to discuss more in depth.

## 2022-06-11 ENCOUNTER — Other Ambulatory Visit (INDEPENDENT_AMBULATORY_CARE_PROVIDER_SITE_OTHER): Payer: Self-pay

## 2022-06-11 ENCOUNTER — Encounter (INDEPENDENT_AMBULATORY_CARE_PROVIDER_SITE_OTHER): Payer: Self-pay

## 2022-06-11 DIAGNOSIS — D126 Benign neoplasm of colon, unspecified: Secondary | ICD-10-CM

## 2022-06-11 NOTE — Telephone Encounter (Signed)
Thanks

## 2022-06-19 ENCOUNTER — Inpatient Hospital Stay: Payer: BC Managed Care – PPO | Admitting: Genetic Counselor

## 2022-06-19 ENCOUNTER — Ambulatory Visit: Payer: Self-pay | Admitting: Genetic Counselor

## 2022-06-19 ENCOUNTER — Telehealth: Payer: Self-pay | Admitting: Genetic Counselor

## 2022-06-19 DIAGNOSIS — Z86010 Personal history of colonic polyps: Secondary | ICD-10-CM

## 2022-06-19 DIAGNOSIS — Z1379 Encounter for other screening for genetic and chromosomal anomalies: Secondary | ICD-10-CM

## 2022-06-19 DIAGNOSIS — Z1509 Genetic susceptibility to other malignant neoplasm: Secondary | ICD-10-CM

## 2022-06-19 NOTE — Telephone Encounter (Signed)
Patient called to cancel genetics appointment today due to not feeling well.  Will call back when ready to reschedule.  Reviewed recommendations for MAP and risks for family members.  Will send patient materials about MAP and family risks/testing.  Patient knows to call when she is ready to reschedule genetics appt or when she has further questions.

## 2022-06-24 ENCOUNTER — Encounter (INDEPENDENT_AMBULATORY_CARE_PROVIDER_SITE_OTHER): Payer: Self-pay

## 2022-07-08 ENCOUNTER — Encounter: Payer: Self-pay | Admitting: Genetic Counselor

## 2022-07-08 NOTE — Progress Notes (Signed)
GENETIC TEST RESULTS   Patient Name: Sandra Parker Patient Age: 46 Encounter Date: June 19, 2022    YECENIA DALGLEISH  was seen in the [facility] clinic on May 22, 2022 due to a a personal history of colon polyps and concern regarding a hereditary predisposition to polyps/cancer. Please refer to the prior Genetics clinic note for more information regarding Ms. Zukas medical and family histories and our assessment at the time.   In May 2023, at the age of 50, Ms. Truluck was found to have 27 tubular adenomas and one hyperplastic polyp by colonoscopy.    FAMILY HISTORY:  We obtained a detailed, 4-generation family history.  Significant diagnoses are listed below:      Family History  Problem Relation Age of Onset   Leukemia Maternal Uncle          d. > 31       Ms. Mccardle is unaware of previous family history of genetic testing for hereditary cancer risks.  There is no reported Ashkenazi Jewish ancestry. There is no known consanguinity.   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    The test report will be scanned into EPIC and is located under the Molecular Pathology section of the Results Review tab.  A portion of the result report is included below for reference. Genetic testing reported out on June 09, 2022.      MUTYH CLINICAL INFORMATION  MUTYH-associated polyposis (MAP) is characterized by an increased lifetime risk of colon cancer.  MAP is typically associated with 10 to a few hundred lifetime adenomatous polyps.  Serrated adenomas, hyperplastic/sessile serrated polys, and mixed polyps can also occur.     The cancers/features associated with MAP are:  70-90% risk of colon cancer if polyposis left untreated 17-34% chance of duodenal polyposis  ~4% chance of duodenal cancer ~11% chance of  gastric fundic gland polyps Limited evidence for slight increased chance of endometrial cancer    MAP MANAGEMENT Colorectal cancer surveillance Colonoscopy beginning no later than age 44-30 years For small adenoma burden that can be handled endoscopically high-quality colonoscopy and polypectomy every 1-2 years For adenoma burden that cannot be handled endoscopically Colectomy with IRA and endoscopic evaluation of rectum every 6-12 months depending on polyp burden Consider proctocolectomy with IPAA if dense rectal polyposis not management with polypectomy  Extracolonic surveillance:  Annual physical exam Baseline upper endoscopy (including complete visualization of the ampulla of Vater beginning at age 93-35)    This information is based on current understanding of the gene and may change in the future.   An individual's cancer risk and medical management are not determined by genetic test results alone. Overall cancer risk assessment incorporates additional factors, including personal medical history, family history, and any available genetic information that may result in a personalized plan for cancer prevention and surveillance.   Implications for Family Members: Hereditary predisposition to cancer due to pathogenic variants in the MUTYH genes are associated with autosomal recessive inheritance.  This means that full siblings of Ms. Lamson have a 25% chance of also having MAP.  They have a 50% chance of being carriers of a single, heterozygous MUTYH mutation. Identification of pathogenic variants in MUTYH allows for the recognition of at-risk relatives who can pursue testing for the familial variants.  Children of those with MAP are obligate carriers of MAP (single, heterozygous pathogenic variant in MUTYH).  Approximately 1 in 100 people in the general population are carriers for MAP.  Thus, children of an individual with MAP have less than a 1% chance of having MAP themselves.  When  one parent has MAP, full sequencing of MUTYH may be considered in an unaffected parent (the father of Ms. Camuso's children).  If the unaffected parent is found to not have a MUTYH pathogenic variant, genetic testing in the children is not necessary to determine MAP status. If the unaffected parent is not tested, comprehensive testing of MUTYH should be considered in the adult children. If the unaffected parent is found to have one MUTYH pathogenic variant, testing the adult children for the familial MUTYH pathogenic variants is indicated.   At this point in time, having a single heterozygous pathogenic variant in MUTYH may only slightly increase colon cancer risk.  No changes in screening are recommended based on carrier status alone (single MUTYH mutation).    Family members are encouraged to consider genetic testing for these familial pathogenic variants. Family members may contact our office at [phone] for more information or to schedule an appointment.  Complimentary testing for the familial variants are available for 90 days after the report date.  Based on their risk assessment, genetic counselors may recommend more comprehensive testing beyond what is included in the complimentary testing. Family members who live outside of the area are encouraged to find a genetic counselor in their area by visiting: PanelJobs.es.   Resources: FORCE (Facing Our Risk of Cancer Empowered) is a resource for those with a hereditary predisposition to develop cancer.  FORCE provides information about risk reduction, advocacy, legislation, and clinical trials.  Additionally, FORCE provides a platform for collaboration and support; which includes: peer navigation, message boards, local support groups, a toll-free helpline, research registry and recruitment, advocate training, published medical research, webinars, brochures, mastectomy photos, and more.  For more information, visit  www.facingourrisk.org   [facility]: SpiritualWord.at    PLAN:  Dr. Maylon Peppers Mayorga notified of this result. Follow-up colonoscopy is planned for one year interval.  EGD is scheduled for 07/25/2022.  Ms. Markham was provided with information about MAP to share with family.  She knows family members should be offered genetic testing.  She knows that her siblings have the highest chance in the family of also having MAP.  Her adult children can determine if they have MAP through full sequencing of their MUTYH genes or determine if they have a higher chance of MAP through full sequencing of their father's MUTYH genes   We encouraged Ms. Willmon to remain in contact with [place] on an annual basis so we can update her personal and family histories, and let her know of advances in cancer genetics that may benefit the family. Our contact number was provided. Ms. Bricco questions were answered to her satisfaction today, and she knows she is welcome to call anytime with additional questions.      Neema Barreira M. Joette Catching, [place], [facility] Genetic Counselor [email or web address] (P) [phone]

## 2022-07-10 ENCOUNTER — Telehealth: Payer: Self-pay | Admitting: Genetic Counselor

## 2022-07-10 NOTE — Telephone Encounter (Signed)
Reviewed MAP results and answered questions about family testing and cancer risks/management.

## 2022-07-21 ENCOUNTER — Encounter (HOSPITAL_COMMUNITY)
Admission: RE | Admit: 2022-07-21 | Discharge: 2022-07-21 | Disposition: A | Discharge status: Home / Self Care | Payer: BC Managed Care – PPO | Source: Ambulatory Visit | Attending: Gastroenterology | Admitting: Gastroenterology

## 2022-07-21 NOTE — Progress Notes (Signed)
Patient called to cancel the procedure due to financial reasons. The office and scheduler notified.

## 2022-07-25 ENCOUNTER — Encounter (HOSPITAL_COMMUNITY): Admission: RE | Payer: Self-pay | Source: Home / Self Care

## 2022-07-25 ENCOUNTER — Ambulatory Visit (HOSPITAL_COMMUNITY)
Admission: RE | Admit: 2022-07-25 | Payer: BC Managed Care – PPO | Source: Home / Self Care | Admitting: Gastroenterology

## 2022-07-25 SURGERY — ESOPHAGOGASTRODUODENOSCOPY (EGD) WITH PROPOFOL
Anesthesia: Monitor Anesthesia Care

## 2022-07-26 IMAGING — MG MM DIGITAL SCREENING BILAT W/ TOMO AND CAD
6 of 10 series · 6 of 30 positions shown · non-contrast
Comparison: Previous exam(s).

CLINICAL DATA: Screening.

EXAM:
DIGITAL SCREENING BILATERAL MAMMOGRAM WITH TOMOSYNTHESIS AND CAD
TECHNIQUE: Bilateral screening digital craniocaudal and mediolateral oblique
mammograms were obtained. Bilateral screening digital breast
tomosynthesis was performed. The images were evaluated with
computer-aided detection.

[R MLO synth-2D (1 of 2)]
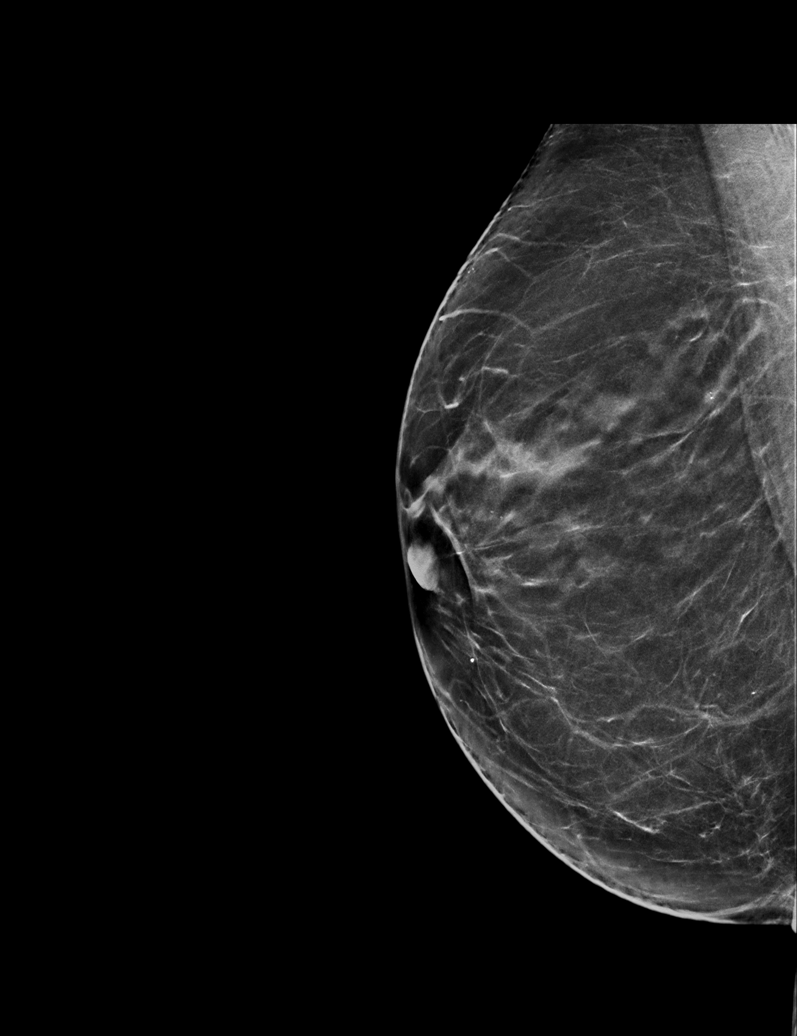

[R CC synth-2D]
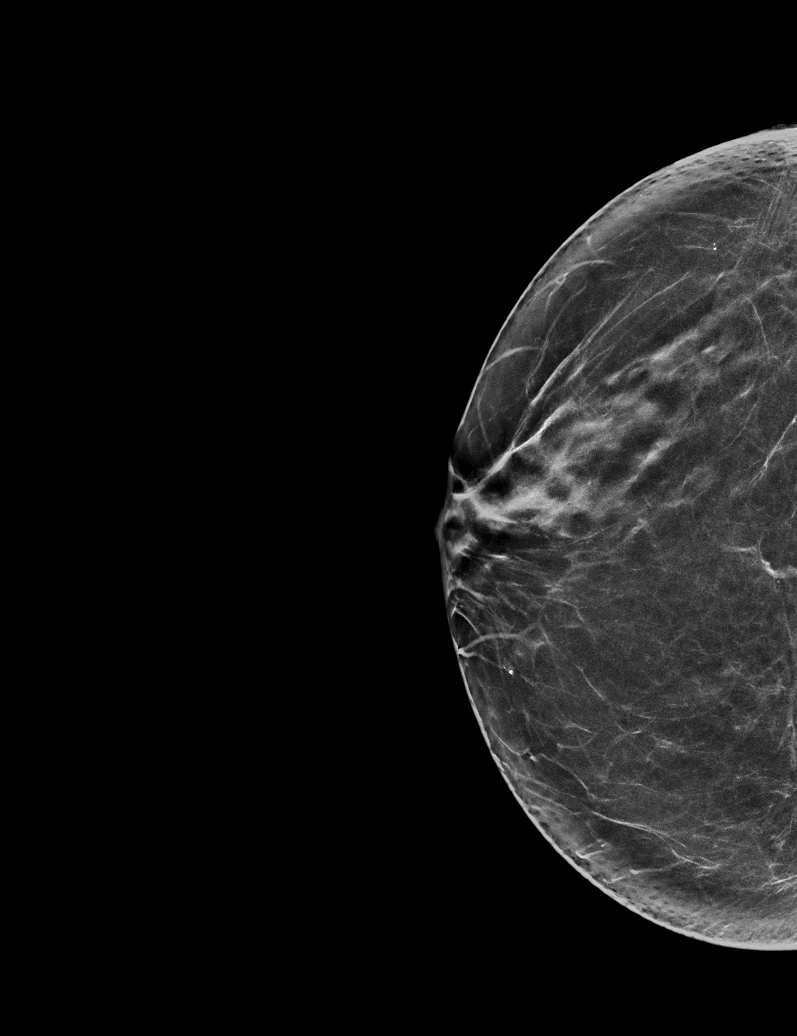

[L MLO synth-2D]
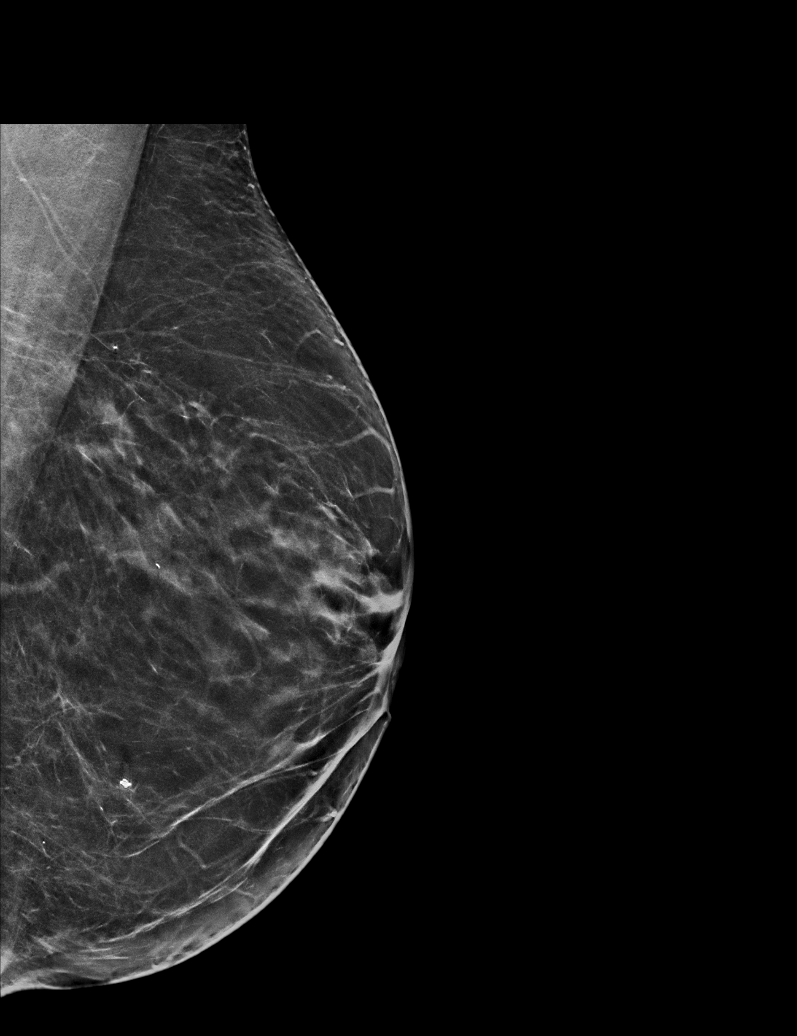

[L CC synth-2D]
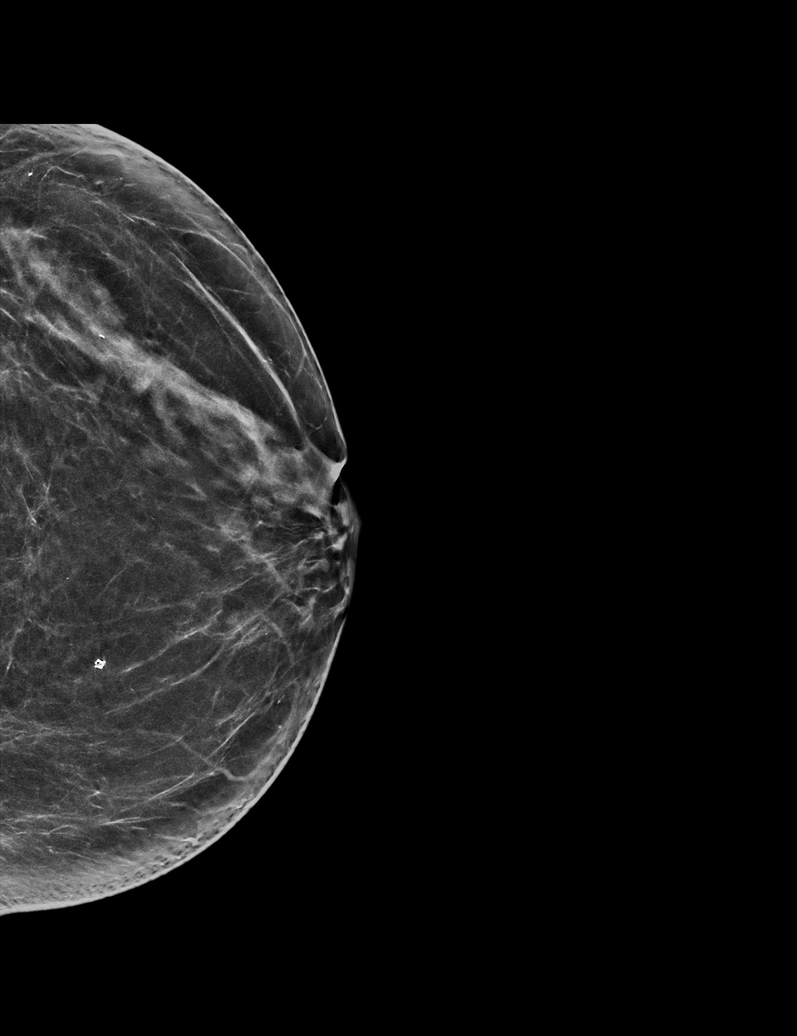

[R MLO synth-2D (2 of 2)]
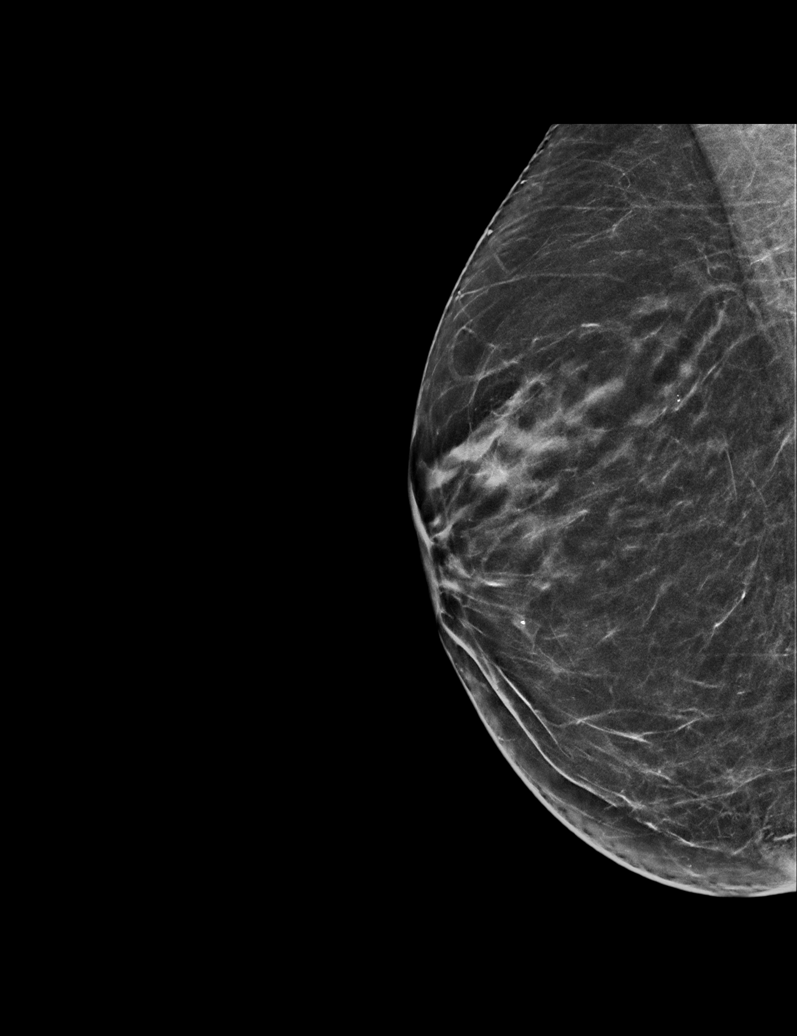

[R MLO tomo · tomo slice 32/63.0]
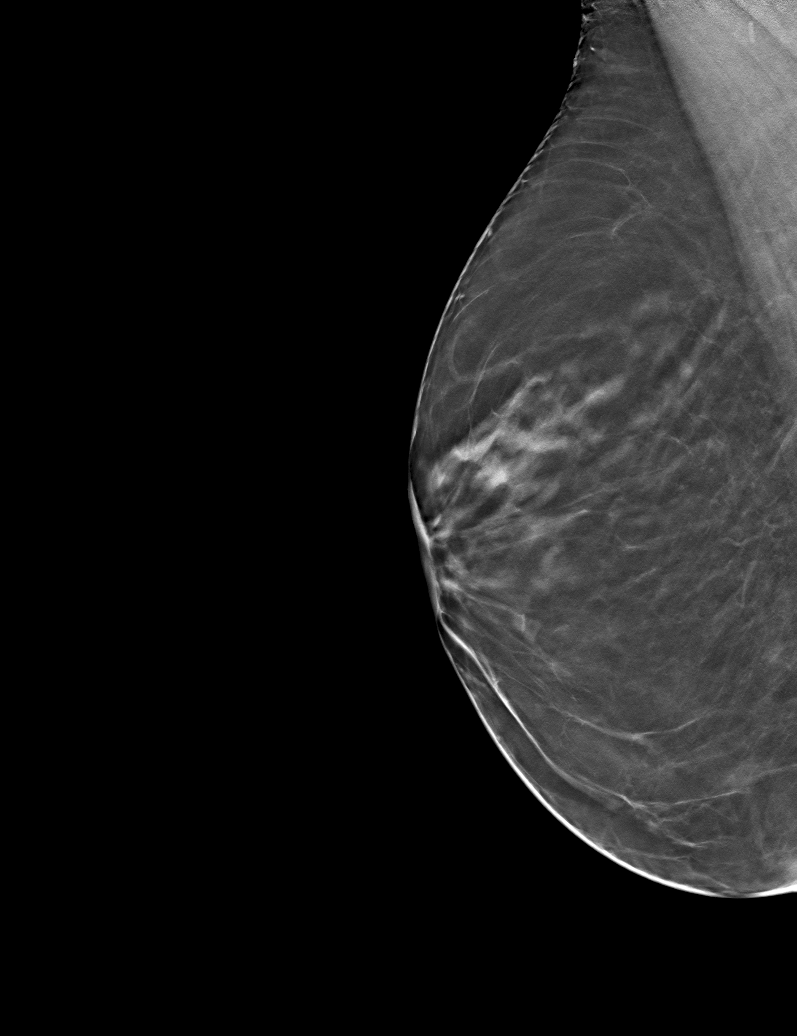

[6 of 30 positions shown; findings below may reference images not displayed]

ACR Breast Density Category b: There are scattered areas of
fibroglandular density.
FINDINGS: There are no findings suspicious for malignancy.
IMPRESSION: No mammographic evidence of malignancy. A result letter of this
screening mammogram will be mailed directly to the patient.

RECOMMENDATION:
Screening mammogram in one year. (Code:51-O-LD2)

BI-RADS CATEGORY  1: Negative.

## 2022-08-11 ENCOUNTER — Other Ambulatory Visit: Payer: Self-pay | Admitting: Nurse Practitioner

## 2022-08-11 DIAGNOSIS — K219 Gastro-esophageal reflux disease without esophagitis: Secondary | ICD-10-CM

## 2022-09-18 ENCOUNTER — Other Ambulatory Visit (INDEPENDENT_AMBULATORY_CARE_PROVIDER_SITE_OTHER): Payer: Self-pay

## 2022-09-18 ENCOUNTER — Encounter (INDEPENDENT_AMBULATORY_CARE_PROVIDER_SITE_OTHER): Payer: Self-pay

## 2022-09-18 DIAGNOSIS — Z1509 Genetic susceptibility to other malignant neoplasm: Secondary | ICD-10-CM

## 2022-09-26 IMAGING — CT CT ABD-PELV W/O CM
2 of 4 series · 16 of 46 positions shown, 18 images · non-contrast
Comparison: CT abdomen/pelvis 10/02/2017

CLINICAL DATA: Right flank pain for 2 weeks with hematuria, history
of kidney stone



[Series 2: axial st · axial · 0.77mm/px · z∈[-522,-162]mm · 13 of 84 slices shown, 15 images]
[im 6/84  soft-tissue]
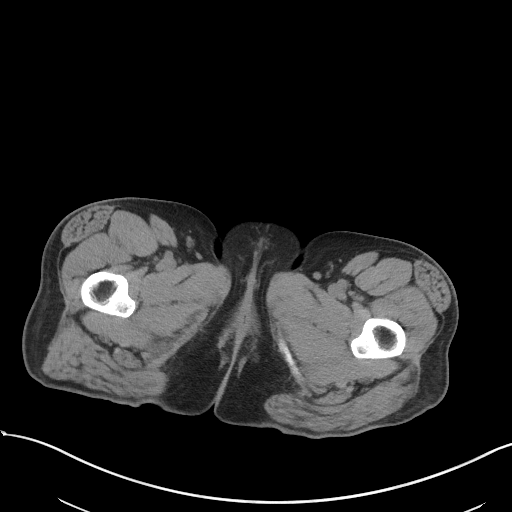
[im 6/84  bone]
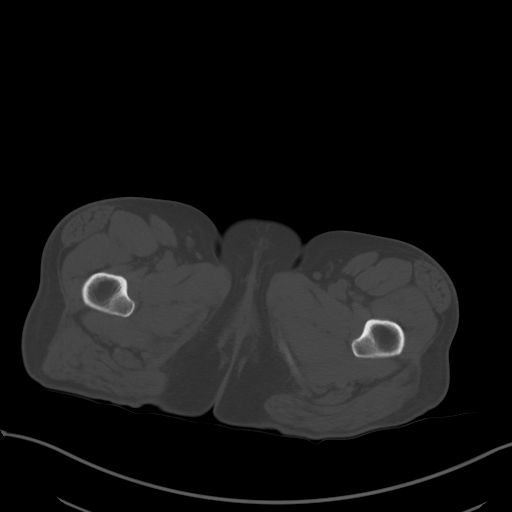
[im 11/84  soft-tissue]
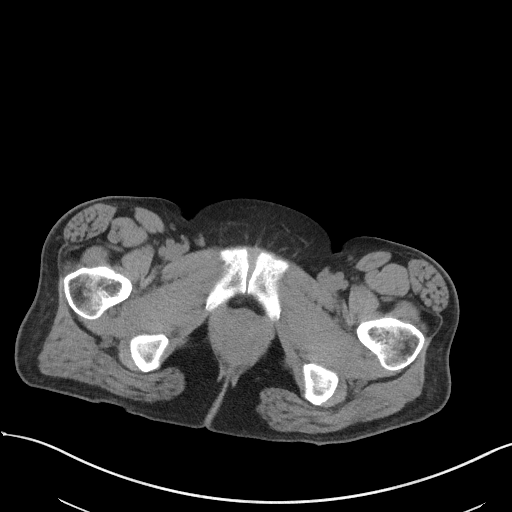
[im 16/84  soft-tissue]
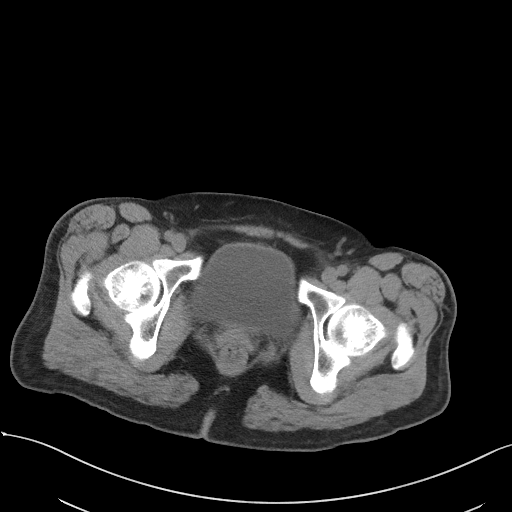
[im 26/84  soft-tissue]
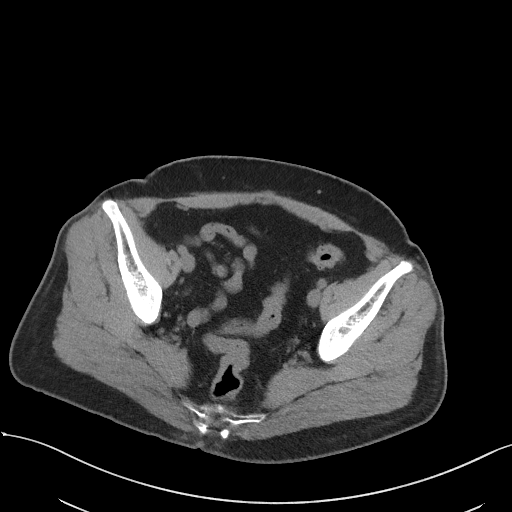
[im 32/84  soft-tissue]
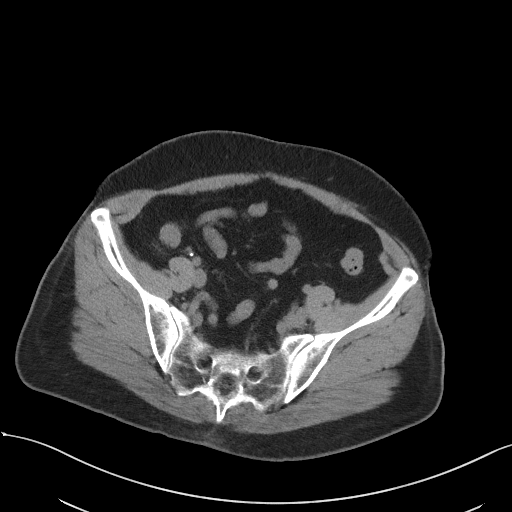
[im 37/84  soft-tissue]
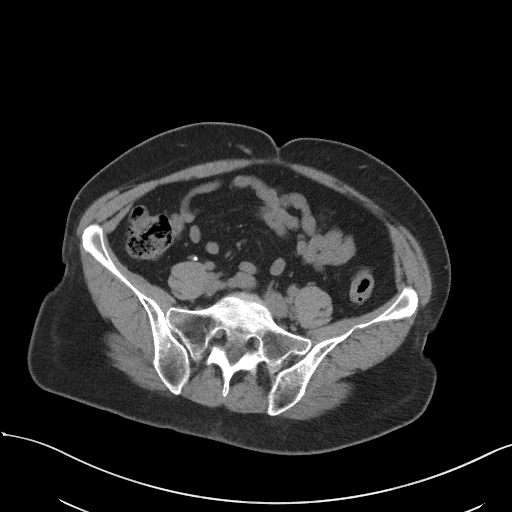
[im 42/84  soft-tissue]
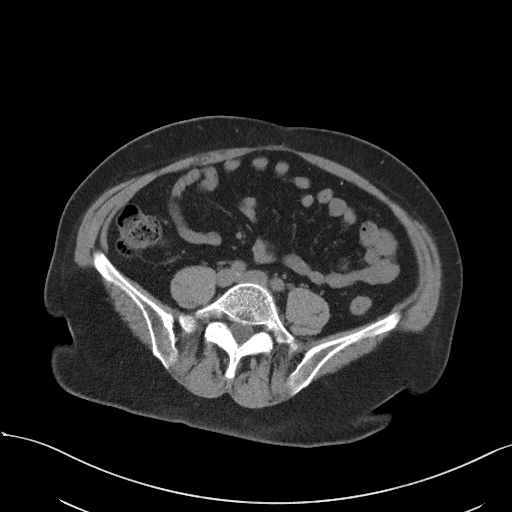
[im 47/84  soft-tissue]
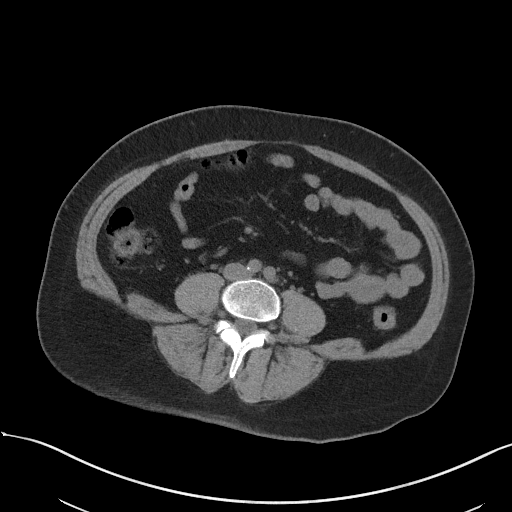
[im 52/84  soft-tissue]
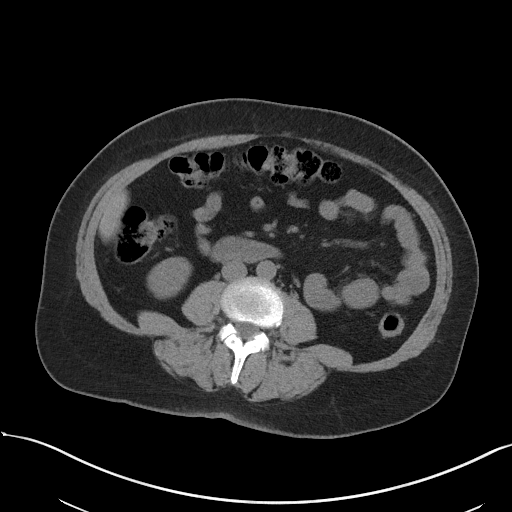
[im 52/84  bone]
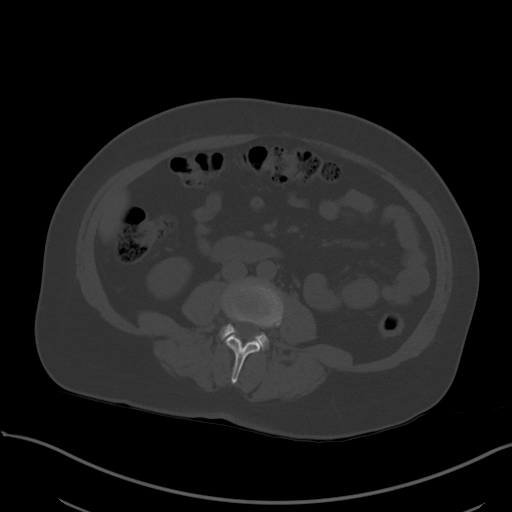
[im 58/84  soft-tissue]
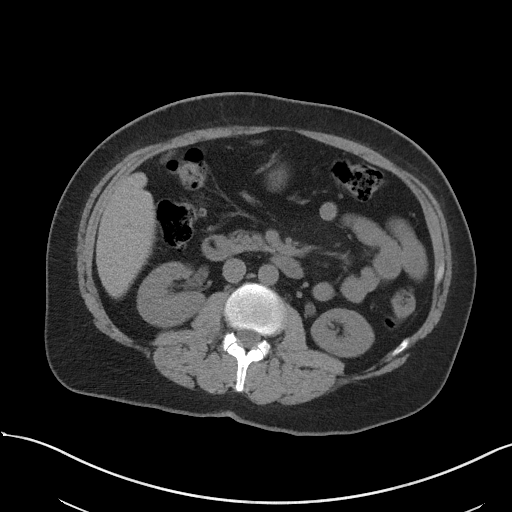
[im 68/84  soft-tissue]
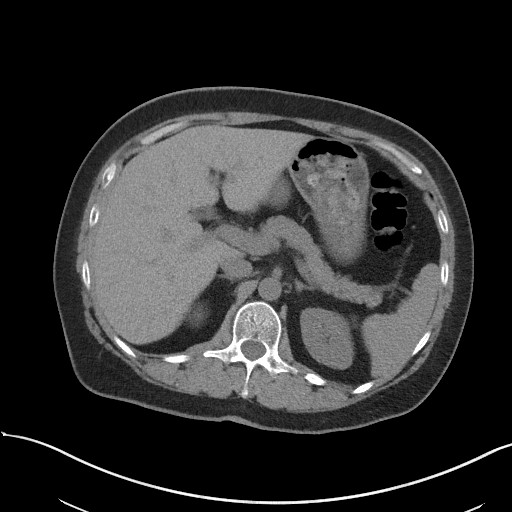
[im 73/84  soft-tissue]
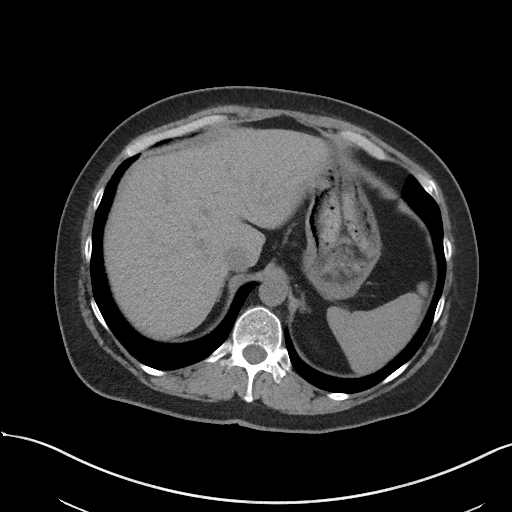
[im 78/84  soft-tissue]
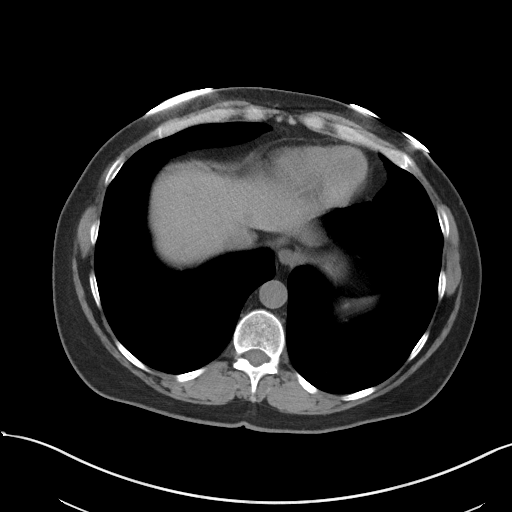

[Series 5: coronal st · coronal · 0.67mm/px · 3 of 105 slices shown]
[im 35/105  soft-tissue]
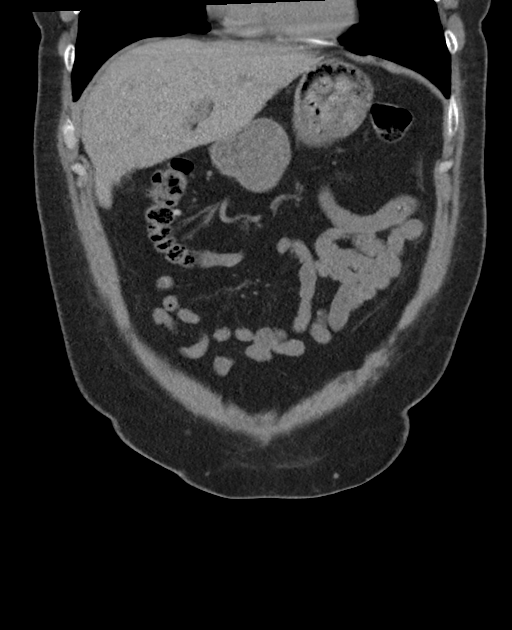
[im 47/105  soft-tissue]
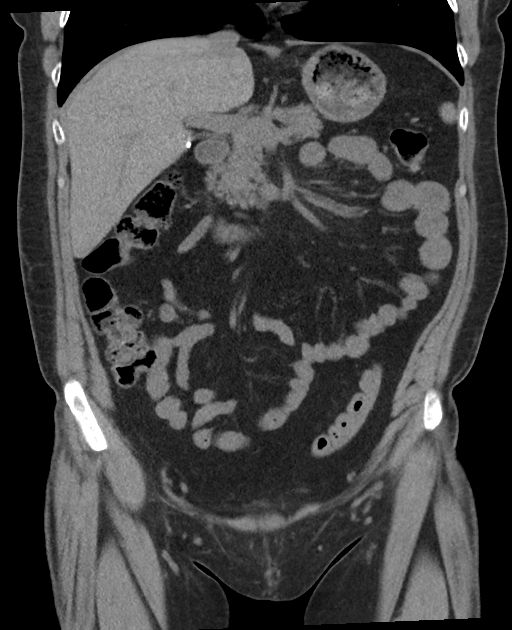
[im 58/105  soft-tissue]
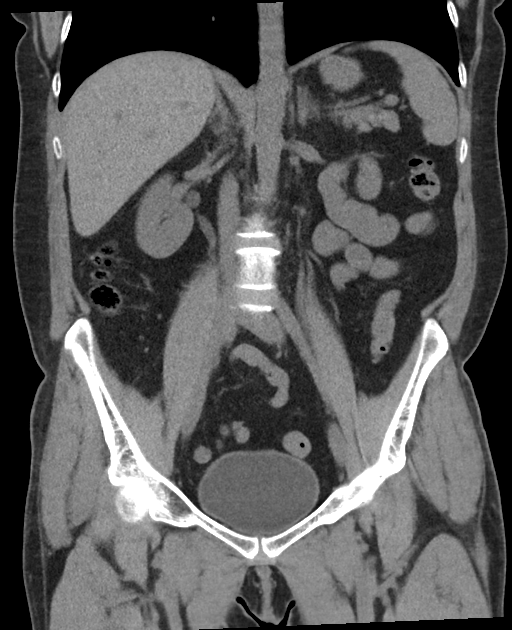

[16 of 46 positions shown; findings below may reference images not displayed]

FINDINGS: Lower chest: The lung bases are clear. Imaged heart is unremarkable.

Hepatobiliary: The liver is unremarkable. The gallbladder is
surgically absent. There is no biliary ductal dilatation.

Pancreas: Unremarkable.

Spleen: Unremarkable.

Adrenals/Urinary Tract: The adrenals are unremarkable.

The kidneys are unremarkable, with no focal lesion, stone,
hydronephrosis or hydroureter. No stone is seen along the course of
either ureter. There is no periureteral stranding to suggest
recently passed stone. The bladder is unremarkable. No stones are
seen in the bladder.

Stomach/Bowel: The stomach is unremarkable. There is no evidence of
bowel obstruction. There is no abnormal bowel wall thickening or
inflammatory change. There are scattered colonic diverticula without
evidence of acute diverticulitis. The appendix is surgically absent.

Vascular/Lymphatic: The abdominal aorta is normal in course and
caliber with scattered calcifications in the right common iliac
artery. There is no abdominal or pelvic lymphadenopathy.

Reproductive: The uterus is surgically absent. There is no adnexal
mass.

Other: There is no ascites or free air.

Musculoskeletal: There is no acute osseous abnormality or aggressive
osseous lesion.
IMPRESSION: 1. No acute findings in the abdomen or pelvis. No renal stones or
hydroureteronephrosis identified.
2. Diverticulosis without evidence of acute diverticulitis.

## 2022-10-02 ENCOUNTER — Other Ambulatory Visit (HOSPITAL_COMMUNITY): Payer: Self-pay | Admitting: Nurse Practitioner

## 2022-10-02 DIAGNOSIS — Z1231 Encounter for screening mammogram for malignant neoplasm of breast: Secondary | ICD-10-CM

## 2022-10-28 ENCOUNTER — Encounter (HOSPITAL_COMMUNITY): Payer: Self-pay

## 2022-10-28 ENCOUNTER — Encounter (HOSPITAL_COMMUNITY)
Admission: RE | Admit: 2022-10-28 | Discharge: 2022-10-28 | Disposition: A | Discharge status: Home / Self Care | Payer: BC Managed Care – PPO | Source: Ambulatory Visit | Attending: Gastroenterology | Admitting: Gastroenterology

## 2022-10-31 ENCOUNTER — Ambulatory Visit (HOSPITAL_COMMUNITY)
Admission: RE | Admit: 2022-10-31 | Discharge: 2022-10-31 | Disposition: A | Discharge status: Home / Self Care | Payer: BC Managed Care – PPO | Source: Ambulatory Visit | Attending: Gastroenterology | Admitting: Gastroenterology

## 2022-10-31 ENCOUNTER — Ambulatory Visit (HOSPITAL_COMMUNITY): Payer: BC Managed Care – PPO | Admitting: Anesthesiology

## 2022-10-31 ENCOUNTER — Encounter (HOSPITAL_COMMUNITY)
Admission: RE | Disposition: A | Discharge status: Home / Self Care | Payer: Self-pay | Source: Ambulatory Visit | Attending: Gastroenterology

## 2022-10-31 DIAGNOSIS — K2950 Unspecified chronic gastritis without bleeding: Secondary | ICD-10-CM | POA: Diagnosis not present

## 2022-10-31 DIAGNOSIS — Z1509 Genetic susceptibility to other malignant neoplasm: Secondary | ICD-10-CM

## 2022-10-31 DIAGNOSIS — K219 Gastro-esophageal reflux disease without esophagitis: Secondary | ICD-10-CM | POA: Diagnosis not present

## 2022-10-31 DIAGNOSIS — R519 Headache, unspecified: Secondary | ICD-10-CM | POA: Diagnosis not present

## 2022-10-31 DIAGNOSIS — K317 Polyp of stomach and duodenum: Secondary | ICD-10-CM | POA: Diagnosis not present

## 2022-10-31 DIAGNOSIS — M549 Dorsalgia, unspecified: Secondary | ICD-10-CM | POA: Diagnosis not present

## 2022-10-31 DIAGNOSIS — R42 Dizziness and giddiness: Secondary | ICD-10-CM | POA: Insufficient documentation

## 2022-10-31 DIAGNOSIS — F32A Depression, unspecified: Secondary | ICD-10-CM | POA: Diagnosis not present

## 2022-10-31 DIAGNOSIS — F419 Anxiety disorder, unspecified: Secondary | ICD-10-CM | POA: Diagnosis not present

## 2022-10-31 HISTORY — PX: ESOPHAGOGASTRODUODENOSCOPY (EGD) WITH PROPOFOL: SHX5813

## 2022-10-31 HISTORY — PX: BIOPSY: SHX5522

## 2022-10-31 HISTORY — PX: POLYPECTOMY: SHX5525

## 2022-10-31 SURGERY — ESOPHAGOGASTRODUODENOSCOPY (EGD) WITH PROPOFOL
Anesthesia: General

## 2022-10-31 MED ORDER — LIDOCAINE HCL 1 % IJ SOLN
INTRAMUSCULAR | Status: DC | PRN
  Administered 2022-10-31: 50 mg via INTRADERMAL

## 2022-10-31 MED ORDER — LACTATED RINGERS IV SOLN: INTRAVENOUS | Status: DC

## 2022-10-31 MED ORDER — PROPOFOL 10 MG/ML IV BOLUS
INTRAVENOUS | Status: DC | PRN
  Administered 2022-10-31: 20 mg via INTRAVENOUS
  Administered 2022-10-31: 100 mg via INTRAVENOUS
  Administered 2022-10-31 (×2): 50 mg via INTRAVENOUS

## 2022-10-31 MED ORDER — PANTOPRAZOLE SODIUM 40 MG PO TBEC: 40.0000 mg | DELAYED_RELEASE_TABLET | Freq: Two times a day (BID) | ORAL | 0 refills | Status: DC

## 2022-10-31 NOTE — Transfer of Care (Addendum)
Immediate Anesthesia Transfer of Care Note  Patient: Sandra Parker  Procedure(s) Performed: ESOPHAGOGASTRODUODENOSCOPY (EGD) WITH PROPOFOL POLYPECTOMY BIOPSY  Patient Location: Short Stay  Anesthesia Type:General  Level of Consciousness: awake  Airway & Oxygen Therapy: Patient Spontanous Breathing  Post-op Assessment: Report given to RN  Post vital signs: Reviewed  Last Vitals:  Vitals Value Taken Time  BP 109/83   Temp 36.4   Pulse 85   Resp 18   SpO2 100     Last Pain:  Vitals:   10/31/22 1229  PainSc: 0-No pain         Complications: No notable events documented.

## 2022-10-31 NOTE — Discharge Instructions (Signed)
You are being discharged to home.  Resume your previous diet.  We are waiting for your pathology results.  Your physician has recommended a repeat upper endoscopy for surveillance based on pathology results.  Schedule neck US for thyroid cancer screening. Use Protonix (pantoprazole) 40 mg PO BID for 1 month.

## 2022-10-31 NOTE — Anesthesia Preprocedure Evaluation (Addendum)
Anesthesia Evaluation  Patient identified by MRN, date of birth, ID band Patient awake    Reviewed: Allergy & Precautions, NPO status , Patient's Chart, lab work & pertinent test results  Airway Mallampati: II  TM Distance: >3 FB Neck ROM: Full   Comment: TMJ dislocation Dental  (+) Dental Advisory Given, Missing   Pulmonary neg pulmonary ROS   Pulmonary exam normal breath sounds clear to auscultation       Cardiovascular negative cardio ROS Normal cardiovascular exam Rhythm:Regular Rate:Normal     Neuro/Psych  Headaches PSYCHIATRIC DISORDERS Anxiety Depression       GI/Hepatic Neg liver ROS,GERD  Medicated and Controlled,,  Endo/Other  negative endocrine ROS    Renal/GU negative Renal ROS  negative genitourinary   Musculoskeletal Back pain   Abdominal   Peds negative pediatric ROS (+)  Hematology negative hematology ROS (+)   Anesthesia Other Findings TMJ Dislocation Vertigo   Reproductive/Obstetrics negative OB ROS                              Anesthesia Physical Anesthesia Plan  ASA: 2  Anesthesia Plan: General   Post-op Pain Management: Minimal or no pain anticipated   Induction: Intravenous  PONV Risk Score and Plan: Propofol infusion  Airway Management Planned: Nasal Cannula and Natural Airway  Additional Equipment:   Intra-op Plan:   Post-operative Plan:   Informed Consent: I have reviewed the patients History and Physical, chart, labs and discussed the procedure including the risks, benefits and alternatives for the proposed anesthesia with the patient or authorized representative who has indicated his/her understanding and acceptance.     Dental advisory given  Plan Discussed with: CRNA and Surgeon  Anesthesia Plan Comments:          Anesthesia Quick Evaluation

## 2022-10-31 NOTE — Anesthesia Postprocedure Evaluation (Signed)
Anesthesia Post Note  Patient: Sandra Parker  Procedure(s) Performed: ESOPHAGOGASTRODUODENOSCOPY (EGD) WITH PROPOFOL POLYPECTOMY BIOPSY  Patient location during evaluation: Short Stay Anesthesia Type: General Level of consciousness: awake and alert Pain management: pain level controlled Vital Signs Assessment: post-procedure vital signs reviewed and stable Respiratory status: spontaneous breathing Cardiovascular status: blood pressure returned to baseline and stable Postop Assessment: no apparent nausea or vomiting Anesthetic complications: no   No notable events documented.   Last Vitals:  Vitals:   10/31/22 1130 10/31/22 1256  BP:  109/83  Pulse: 77 85  Resp: 20 18  Temp:  36.4 C  SpO2: 100% 100%    Last Pain:  Vitals:   10/31/22 1256  TempSrc: Oral  PainSc: 0-No pain                 Willo Yoon

## 2022-10-31 NOTE — Op Note (Addendum)
[facility] Patient Name: Sandra Parker Procedure Date: 10/31/2022 12:11 PM MRN: [id number] Date of Birth: Jan 21, 1976 Attending MD: Maylon Peppers , , [id number] CSN: [id number] Age: 46 Admit Type: Outpatient Procedure:                Upper GI endoscopy Indications:              MUTYH polyposis syndrome, gastric and duodenal                            malignancy screening Providers:                Maylon Peppers, Lambert Mody, Aram Candela Referring MD:              Medicines:                Monitored Anesthesia Care Complications:            No immediate complications. Estimated Blood Loss:     Estimated blood loss: none. Procedure:                Pre-Anesthesia Assessment:                           - Prior to the procedure, a History and Physical                            was performed, and patient medications, allergies                            and sensitivities were reviewed. The patient's                            tolerance of previous anesthesia was reviewed.                           - The risks and benefits of the procedure and the                            sedation options and risks were discussed with the                            patient. All questions were answered and informed                            consent was obtained.                           - ASA Grade Assessment: II - A patient with mild                            systemic disease.                           - Adequate visualization was aided with the use of                            a transparent cap attached  to the distal part of                            the endoscope.                           After obtaining informed consent, the endoscope was                            passed under direct vision. Throughout the                            procedure, the patient's blood pressure, pulse, and                            oxygen saturations were monitored continuously. The                             [id number]-[id number] ([id number]) scope was introduced through the                            mouth, and advanced to the second part of duodenum.                            The upper GI endoscopy was accomplished without                            difficulty. The patient tolerated the procedure                            well. Scope In: 12:32:52 PM Scope Out: 12:48:34 PM Total Procedure Duration: 0 hours 15 minutes 42 seconds  Findings:      The examined esophagus was normal.      A few small semi-sessile polyps with no bleeding and no stigmata of       recent bleeding were found in the gastric body. Three polyps were       removed with a cold snare. Resection and retrieval were complete.      The entire examined stomach was normal. Imaging was performed using       white light and narrow band imaging to visualize the mucosa. Biopsies       from the body, antrum and incisura were taken with a cold forceps for       histology.      The examined duodenum was normal. Imaging was performed using white       light and narrow band imaging to visualize the mucosa, with special       attention to the ampulla. Ampulla did not show any abnormalities, polyps       or adenomatous changes. Biopsies from the ampulla were taken with a cold       forceps for histology. Impression:               - Normal esophagus.                           - A few gastric polyps. Resected and retrieved.                           -  Normal stomach. Biopsied.                           - Normal examined duodenum. Biopsied. Moderate Sedation:      Per Anesthesia Care Recommendation:           - Discharge patient to home (ambulatory).                           - Resume previous diet.                           - Await pathology results.                           - Repeat upper endoscopy for surveillance based on                            pathology results.                           - Schedule neck US for thyroid cancer  screening.                           - Use Protonix (pantoprazole) 40 mg PO BID for 1                            month. Procedure Code(s):        --- Professional ---                           [id number], Esophagogastroduodenoscopy, flexible,                            transoral; with removal of tumor(s), polyp(s), or                            other lesion(s) by snare technique                           43239, 43, Esophagogastroduodenoscopy, flexible,                            transoral; with biopsy, single or multiple Diagnosis Code(s):        --- Professional ---                           K31.7, Polyp of stomach and duodenum CPT copyright 2022 American Medical Association. All rights reserved. The codes documented in this report are preliminary and upon coder review may  be revised to meet current compliance requirements. Maylon Peppers, MD Maylon Peppers,  10/31/2022 12:57:58 PM This report has been signed electronically. Number of Addenda: 0

## 2022-10-31 NOTE — H&P (Signed)
Sandra Parker is an 46 y.o. female.   Chief Complaint: MUTYH associated polyposis HPI: 46 y/o F with PMH MUTYH associated polyposis, depression, GERD, anxiety, coming for evaluation of MUTYH associated polyposis.  The patient denies having any nausea, vomiting, fever, chills, hematochezia, melena, hematemesis, abdominal distention, abdominal pain, diarrhea, jaundice, pruritus or weight loss.   Past Medical History:  Diagnosis Date   Anxiety    Depression    GERD (gastroesophageal reflux disease)    Macromastia 10/2018   Personal history of colonic polyps 05/23/2022   TMJ (temporomandibular joint syndrome)    Upper respiratory tract infection 11/29/2013    Past Surgical History:  Procedure Laterality Date   APPENDECTOMY     BREAST REDUCTION SURGERY Bilateral 11/22/2018   Procedure: BREAST REDUCTION WITH LIPOSUCTION;  Surgeon: Cristine Polio, MD;  Location: [facility];  Service: Plastics;  Laterality: Bilateral;  Bilateral    COLONOSCOPY WITH PROPOFOL N/A 05/21/2022   Procedure: COLONOSCOPY WITH PROPOFOL;  Surgeon: Harvel Quale, MD;  [facility];  Service: Gastroenterology;  Laterality: N/A;  830   HYSTERECTOMY ABDOMINAL WITH SALPINGO-OOPHORECTOMY Left 04/15/2002   HYSTEROSCOPY WITH D & C  08/02/2001   LAPAROSCOPIC CHOLECYSTECTOMY  08/16/2010   POLYPECTOMY  05/21/2022   Procedure: POLYPECTOMY INTESTINAL;  Surgeon: Harvel Quale, MD;  Location: [facility];  Service: Gastroenterology;;   REDUCTION MAMMAPLASTY Bilateral    bilateral breast reduction nov 2019    Family History  Problem Relation Age of Onset   Healthy Mother    Healthy Father    Leukemia Maternal Uncle        d. > 80   Colon cancer Neg Hx    Social History:  reports that she has never smoked. She has never used smokeless tobacco. She reports that she does not currently use alcohol. She reports that she does not use drugs.  Allergies:  Allergies  Allergen  Reactions   Prozac [Fluoxetine] Other (See Comments)    HEADACHES   Wellbutrin [Bupropion] Other (See Comments)    NIGHTMARES   Cephalexin Rash   Ciprofloxacin Rash   Relafen [Nabumetone] Other (See Comments)    UNKNOWN    Medications Prior to Admission  Medication Sig Dispense Refill   ALPRAZolam (XANAX) 0.5 MG tablet Take 1 tablet (0.5 mg total) by mouth 2 (two) times daily as needed for anxiety. 30 tablet 0   amphetamine-dextroamphetamine (ADDERALL XR) 10 MG 24 hr capsule Take 10 mg by mouth every morning.     Multiple Vitamins-Calcium (ONE-A-DAY WOMENS FORMULA PO) Take 1 drop by mouth daily.     pantoprazole (PROTONIX) 40 MG tablet Take 1 tablet (40 mg total) by mouth daily. (Patient taking differently: Take 40 mg by mouth every other day.) 90 tablet 1   acyclovir (ZOVIRAX) 400 MG tablet Take 1 tablet (400 mg total) by mouth 2 (two) times daily. (Patient taking differently: Take 400 mg by mouth 2 (two) times daily as needed (cold sores).) 180 tablet 2   cycloSPORINE (RESTASIS) 0.05 % ophthalmic emulsion Place 1 drop into both eyes 2 (two) times daily.     DOTTI 0.05 MG/24HR patch Place 1 patch onto the skin 2 (two) times a week.     fluticasone (FLONASE) 50 MCG/ACT nasal spray Place 1 spray into both nostrils daily as needed for allergies.     meclizine (ANTIVERT) 12.5 MG tablet Take 1 tablet (12.5 mg total) by mouth 3 (three) times daily as needed for dizziness. 20 tablet 0  Rimegepant Sulfate 75 MG TBDP Take 75 mg by mouth daily as needed (Migraines).     tretinoin (RETIN-A) 0.025 % gel Apply topically at bedtime. 45 g 0   VAGIFEM 10 MCG TABS vaginal tablet Place 1 tablet vaginally at bedtime.      No results found for this or any previous visit (from the past 48 hour(s)). No results found.  Review of Systems  All other systems reviewed and are negative.   Pulse 77, resp. rate 20, SpO2 100 %. Physical Exam  GENERAL: The patient is AO x3, in no acute distress. HEENT: Head  is normocephalic and atraumatic. EOMI are intact. Mouth is well hydrated and without lesions. NECK: Supple. No masses LUNGS: Clear to auscultation. No presence of rhonchi/wheezing/rales. Adequate chest expansion HEART: RRR, normal s1 and s2. ABDOMEN: Soft, nontender, no guarding, no peritoneal signs, and nondistended. BS +. No masses. EXTREMITIES: Without any cyanosis, clubbing, rash, lesions or edema. NEUROLOGIC: AOx3, no focal motor deficit. SKIN: no jaundice, no rashes  Assessment/Plan  46 y/o F with PMH MUTYH associated polyposis, depression, GERD, anxiety, coming for evaluation of MUTYH associated polyposis.  We will proceed with EGD with gastric biopsies and evaluation of the ampulla  Harvel Quale, MD 10/31/2022, 11:37 AM

## 2022-11-03 ENCOUNTER — Telehealth (INDEPENDENT_AMBULATORY_CARE_PROVIDER_SITE_OTHER): Payer: Self-pay | Admitting: *Deleted

## 2022-11-03 DIAGNOSIS — Z1509 Genetic susceptibility to other malignant neoplasm: Secondary | ICD-10-CM

## 2022-11-03 LAB — SURGICAL PATHOLOGY

## 2022-11-03 NOTE — Telephone Encounter (Signed)
Appt scheduled and sent via mychart

## 2022-11-03 NOTE — Telephone Encounter (Signed)
-----   Message from Harvel Quale, MD sent at 10/31/2022 12:16 PM EDT ----- Hi Ghada Abbett/Tammy,   Can you please schedule a neck ultrasound? Dx: MUTYH polyposis, rule out thyroid malignacy.   Thanks,  Maylon Peppers, MD Gastroenterology and Hepatology [facility]

## 2022-11-06 NOTE — Progress Notes (Signed)
MUTYH (MYH)-associated polyposis - genetic polyposis syndrome

## 2022-11-10 ENCOUNTER — Ambulatory Visit (HOSPITAL_COMMUNITY): Payer: BC Managed Care – PPO

## 2022-11-10 ENCOUNTER — Encounter (HOSPITAL_COMMUNITY): Payer: Self-pay | Admitting: Gastroenterology

## 2022-11-12 ENCOUNTER — Other Ambulatory Visit: Payer: Self-pay | Admitting: Nurse Practitioner

## 2022-11-12 DIAGNOSIS — R42 Dizziness and giddiness: Secondary | ICD-10-CM

## 2022-11-15 ENCOUNTER — Other Ambulatory Visit: Payer: Self-pay | Admitting: Nurse Practitioner

## 2022-11-15 DIAGNOSIS — R42 Dizziness and giddiness: Secondary | ICD-10-CM

## 2022-11-19 ENCOUNTER — Ambulatory Visit (HOSPITAL_COMMUNITY)
Admission: RE | Admit: 2022-11-19 | Discharge: 2022-11-19 | Disposition: A | Discharge status: Home / Self Care | Payer: BC Managed Care – PPO | Source: Ambulatory Visit | Attending: Gastroenterology | Admitting: Gastroenterology

## 2022-11-19 DIAGNOSIS — D1391 Familial adenomatous polyposis: Secondary | ICD-10-CM | POA: Diagnosis not present

## 2022-11-19 DIAGNOSIS — Z1509 Genetic susceptibility to other malignant neoplasm: Secondary | ICD-10-CM | POA: Insufficient documentation

## 2022-11-24 ENCOUNTER — Encounter (INDEPENDENT_AMBULATORY_CARE_PROVIDER_SITE_OTHER): Payer: Self-pay

## 2022-12-15 ENCOUNTER — Ambulatory Visit: Admit: 2022-12-15 | Payer: BC Managed Care – PPO

## 2022-12-18 ENCOUNTER — Other Ambulatory Visit (HOSPITAL_COMMUNITY): Payer: Self-pay | Admitting: Family Medicine

## 2022-12-18 DIAGNOSIS — R051 Acute cough: Secondary | ICD-10-CM

## 2022-12-26 ENCOUNTER — Ambulatory Visit (HOSPITAL_COMMUNITY)
Admission: RE | Admit: 2022-12-26 | Discharge: 2022-12-26 | Disposition: A | Discharge status: Home / Self Care | Payer: BC Managed Care – PPO | Source: Ambulatory Visit | Attending: Nurse Practitioner | Admitting: Nurse Practitioner

## 2022-12-26 DIAGNOSIS — Z1231 Encounter for screening mammogram for malignant neoplasm of breast: Secondary | ICD-10-CM | POA: Insufficient documentation

## 2022-12-30 ENCOUNTER — Institutional Professional Consult (permissible substitution): Payer: BC Managed Care – PPO | Admitting: Plastic Surgery

## 2023-01-01 ENCOUNTER — Other Ambulatory Visit (HOSPITAL_COMMUNITY): Payer: Self-pay | Admitting: Nurse Practitioner

## 2023-01-01 DIAGNOSIS — R928 Other abnormal and inconclusive findings on diagnostic imaging of breast: Secondary | ICD-10-CM

## 2023-01-15 ENCOUNTER — Encounter (HOSPITAL_COMMUNITY): Payer: Self-pay

## 2023-01-15 ENCOUNTER — Ambulatory Visit (HOSPITAL_COMMUNITY)
Admission: RE | Admit: 2023-01-15 | Discharge: 2023-01-15 | Disposition: A | Discharge status: Home / Self Care | Payer: BC Managed Care – PPO | Source: Ambulatory Visit | Attending: Nurse Practitioner | Admitting: Nurse Practitioner

## 2023-01-15 DIAGNOSIS — R928 Other abnormal and inconclusive findings on diagnostic imaging of breast: Secondary | ICD-10-CM

## 2023-01-19 ENCOUNTER — Ambulatory Visit: Payer: BC Managed Care – PPO | Admitting: Podiatry

## 2023-02-16 ENCOUNTER — Ambulatory Visit (INDEPENDENT_AMBULATORY_CARE_PROVIDER_SITE_OTHER): Payer: BC Managed Care – PPO | Admitting: Podiatry

## 2023-02-16 DIAGNOSIS — Z91199 Patient's noncompliance with other medical treatment and regimen due to unspecified reason: Secondary | ICD-10-CM

## 2023-02-16 NOTE — Progress Notes (Signed)
No show

## 2023-02-28 ENCOUNTER — Other Ambulatory Visit: Payer: Self-pay | Admitting: Nurse Practitioner

## 2023-03-08 ENCOUNTER — Other Ambulatory Visit: Payer: Self-pay | Admitting: Nurse Practitioner

## 2023-03-26 ENCOUNTER — Other Ambulatory Visit: Payer: BC Managed Care – PPO

## 2023-03-26 DIAGNOSIS — R748 Abnormal levels of other serum enzymes: Secondary | ICD-10-CM

## 2023-03-26 DIAGNOSIS — Z13220 Encounter for screening for lipoid disorders: Secondary | ICD-10-CM

## 2023-03-26 DIAGNOSIS — E663 Overweight: Secondary | ICD-10-CM

## 2023-03-27 LAB — TIQ- MISLABELED: DATE RECEIVED:: 3282024

## 2023-03-30 ENCOUNTER — Ambulatory Visit: Payer: BC Managed Care – PPO

## 2023-03-30 LAB — CBC WITH DIFFERENTIAL/PLATELET
Absolute Monocytes: 317 cells/uL (ref 200–950)
Basophils Absolute: 31 cells/uL (ref 0–200)
Basophils Relative: 0.5 %
Eosinophils Absolute: 128 cells/uL (ref 15–500)
Eosinophils Relative: 2.1 %
HCT: 41 % (ref 35.0–45.0)
Hemoglobin: 13.5 g/dL (ref 11.7–15.5)
Lymphs Abs: 2342.4 cells/uL (ref 850–3900)
MCH: 29.4 pg (ref 27.0–33.0)
MCHC: 32.9 g/dL (ref 32.0–36.0)
MCV: 89.3 fL (ref 80.0–100.0)
MPV: 11.1 fL (ref 7.5–12.5)
Monocytes Relative: 5.2 %
Neutro Abs: 3282 cells/uL (ref 1500–7800)
Neutrophils Relative %: 53.8 %
Platelets: 222 10*3/uL (ref 140–400)
RBC: 4.59 10*6/uL (ref 3.80–5.10)
RDW: 11.9 % (ref 11.0–15.0)
Total Lymphocyte: 38.4 %
WBC: 6.1 10*3/uL (ref 3.8–10.8)

## 2023-03-30 LAB — COMPLETE METABOLIC PANEL WITH GFR
AG Ratio: 1.6 (calc) (ref 1.0–2.5)
ALT: 19 U/L (ref 6–29)
AST: 15 U/L (ref 10–35)
Albumin: 4.4 g/dL (ref 3.6–5.1)
Alkaline phosphatase (APISO): 56 U/L (ref 31–125)
BUN: 13 mg/dL (ref 7–25)
CO2: 25 mmol/L (ref 20–32)
Calcium: 9.4 mg/dL (ref 8.6–10.2)
Chloride: 107 mmol/L (ref 98–110)
Creat: 0.81 mg/dL (ref 0.50–0.99)
Globulin: 2.7 g/dL (calc) (ref 1.9–3.7)
Glucose, Bld: 96 mg/dL (ref 65–99)
Potassium: 4.6 mmol/L (ref 3.5–5.3)
Sodium: 141 mmol/L (ref 135–146)
Total Bilirubin: 0.4 mg/dL (ref 0.2–1.2)
Total Protein: 7.1 g/dL (ref 6.1–8.1)
eGFR: 90 mL/min/{1.73_m2} (ref >=60)

## 2023-03-30 LAB — PAT ID TIQ DOC: Test Affected: 6399

## 2023-03-30 LAB — LIPID PANEL
Cholesterol: 210 mg/dL — ABNORMAL HIGH (ref <200)
HDL: 63 mg/dL (ref >=50)
LDL Cholesterol (Calc): 125 mg/dL (calc) — ABNORMAL HIGH
Non-HDL Cholesterol (Calc): 147 mg/dL (calc) — ABNORMAL HIGH (ref <130)
Total CHOL/HDL Ratio: 3.3 (calc) (ref <5.0)
Triglycerides: 108 mg/dL (ref <150)

## 2023-04-02 ENCOUNTER — Ambulatory Visit: Payer: BC Managed Care – PPO | Admitting: Family Medicine

## 2023-04-13 ENCOUNTER — Other Ambulatory Visit (INDEPENDENT_AMBULATORY_CARE_PROVIDER_SITE_OTHER): Payer: Self-pay | Admitting: Gastroenterology

## 2023-04-13 DIAGNOSIS — K219 Gastro-esophageal reflux disease without esophagitis: Secondary | ICD-10-CM

## 2023-04-13 NOTE — Telephone Encounter (Signed)
Never seen in office and no upcoming appt. Had EGD last November and recommended repeat EGD in 2 years.

## 2023-04-13 NOTE — Telephone Encounter (Signed)
Course was provided for a month, will need to have evaluation in clinic for further refills if needed.

## 2023-04-16 ENCOUNTER — Ambulatory Visit: Payer: BC Managed Care – PPO | Admitting: Family Medicine

## 2023-04-16 ENCOUNTER — Encounter: Payer: Self-pay | Admitting: Family Medicine

## 2023-04-16 VITALS — BP 122/84 | HR 92 | Temp 98.8°F | Ht 61.0 in | Wt 141.0 lb

## 2023-04-16 DIAGNOSIS — F419 Anxiety disorder, unspecified: Secondary | ICD-10-CM | POA: Diagnosis not present

## 2023-04-16 DIAGNOSIS — E782 Mixed hyperlipidemia: Secondary | ICD-10-CM

## 2023-04-16 DIAGNOSIS — F32A Depression, unspecified: Secondary | ICD-10-CM | POA: Diagnosis not present

## 2023-04-16 DIAGNOSIS — F909 Attention-deficit hyperactivity disorder, unspecified type: Secondary | ICD-10-CM

## 2023-04-16 DIAGNOSIS — Z7689 Persons encountering health services in other specified circumstances: Secondary | ICD-10-CM | POA: Insufficient documentation

## 2023-04-16 NOTE — Assessment & Plan Note (Signed)
ASCVD 0.8%. Encouraged heart healthy diet and 150 minutes moderate intensity exercise weekly.

## 2023-04-16 NOTE — Assessment & Plan Note (Signed)
Well controlled on Adderall XR  daily. Follow up in 3 months.

## 2023-04-16 NOTE — Assessment & Plan Note (Signed)
PHQ 1

## 2023-04-16 NOTE — Assessment & Plan Note (Signed)
GAD 10. Reports well controlled on Xanax 0.5mg  BID.

## 2023-04-16 NOTE — Progress Notes (Signed)
New Patient Office Visit  Subjective    Patient ID: Sandra Parker, female    DOB: Mar 27, 1976  Age: 47 y.o. MRN: [id number]  CC:  Chief Complaint  Patient presents with   Establish Care    HPI Sandra Parker presents to establish care. Oriented to practice routines and expectations. PMH includes anxiety, depression, ADD, and GERD. Concerns today include  Breast CA screening: Mammogram status: Completed 01/15/2023. Repeat every year Cervical CA screening:  hysterectomy Colon CA screening: colonoscopy 1 years ago with abnormalities. Benign polyps. Tobacco: never smoker Vaccines:  UTD  The 10-year ASCVD risk score (Arnett DK, et al., 2019) is: 0.8%   Values used to calculate the score:     Age: 72 years     Sex: Female     Is Non-Hispanic African American: No     Diabetic: No     Tobacco smoker: No     Systolic Blood Pressure: 122 mmHg     Is BP treated: No     HDL Cholesterol: 63 mg/dL     Total Cholesterol: 210 mg/dL   Outpatient Encounter Medications as of 04/16/2023  Medication Sig   acyclovir (ZOVIRAX) 400 MG tablet Take 1 tablet (400 mg total) by mouth 2 (two) times daily. (Patient taking differently: Take 400 mg by mouth 2 (two) times daily as needed (cold sores).)   ALPRAZolam (XANAX) 0.5 MG tablet Take 1 tablet (0.5 mg total) by mouth 2 (two) times daily as needed for anxiety.   amphetamine-dextroamphetamine (ADDERALL XR) 25 MG 24 hr capsule Take 25 mg by mouth every morning.   cycloSPORINE (RESTASIS) 0.05 % ophthalmic emulsion Place 1 drop into both eyes 2 (two) times daily.   DOTTI 0.05 MG/24HR patch Place 1 patch onto the skin 2 (two) times a week.   fluticasone (FLONASE) 50 MCG/ACT nasal spray Place 1 spray into both nostrils daily as needed for allergies.   meclizine (ANTIVERT) 12.5 MG tablet Take 1 tablet (12.5 mg total) by mouth 3 (three) times daily as needed for dizziness.   Multiple Vitamins-Calcium (ONE-A-DAY WOMENS FORMULA PO) Take 1 drop by mouth daily.    pantoprazole (PROTONIX) 40 MG tablet Take 1 tablet (40 mg total) by mouth 2 (two) times daily.   Rimegepant Sulfate 75 MG TBDP Take 75 mg by mouth daily as needed (Migraines).   tretinoin (RETIN-A) 0.025 % gel Apply topically at bedtime.   VAGIFEM 10 MCG TABS vaginal tablet Place 1 tablet vaginally at bedtime.   [DISCONTINUED] amphetamine-dextroamphetamine (ADDERALL XR) 10 MG 24 hr capsule Take 10 mg by mouth every morning. (Patient not taking: Reported on 04/16/2023)   No facility-administered encounter medications on file as of 04/16/2023.    Past Medical History:  Diagnosis Date   Anxiety    Depression    GERD (gastroesophageal reflux disease)    Macromastia 10/2018   Personal history of colonic polyps 05/23/2022   TMJ (temporomandibular joint syndrome)    Upper respiratory tract infection 11/29/2013    Past Surgical History:  Procedure Laterality Date   APPENDECTOMY     BIOPSY  10/31/2022   Procedure: BIOPSY;  Surgeon: Dolores Frame, MD;  [facility];  Service: Gastroenterology;;   BREAST REDUCTION SURGERY Bilateral 11/22/2018   Procedure: BREAST REDUCTION WITH LIPOSUCTION;  Surgeon: Louisa Second, MD;  Location: [facility];  Service: Plastics;  Laterality: Bilateral;  Bilateral    COLONOSCOPY WITH PROPOFOL N/A 05/21/2022   Procedure: COLONOSCOPY WITH PROPOFOL;  Surgeon: Levon Hedger  Alisia Ferrari, MD;  [facility]: [facility];  Service: Gastroenterology;  Laterality: N/A;  830   ESOPHAGOGASTRODUODENOSCOPY (EGD) WITH PROPOFOL N/A 10/31/2022   Procedure: ESOPHAGOGASTRODUODENOSCOPY (EGD) WITH PROPOFOL;  Surgeon: Dolores Frame, MD;  [facility];  Service: Gastroenterology;  Laterality: N/A;  1230 ASA 2   HYSTERECTOMY ABDOMINAL WITH SALPINGO-OOPHORECTOMY Left 04/15/2002   HYSTEROSCOPY WITH D & C  08/02/2001   LAPAROSCOPIC CHOLECYSTECTOMY  08/16/2010   POLYPECTOMY  05/21/2022   Procedure: POLYPECTOMY INTESTINAL;  Surgeon:  Dolores Frame, MD;  [facility]: [facility];  Service: Gastroenterology;;   POLYPECTOMY  10/31/2022   Procedure: POLYPECTOMY;  Surgeon: Marguerita Merles, Reuel Boom, MD;  Location: [facility];  Service: Gastroenterology;;   REDUCTION MAMMAPLASTY Bilateral    bilateral breast reduction nov 2019    Family History  Problem Relation Age of Onset   Healthy Mother    Healthy Father    Leukemia Maternal Uncle        d. > 50   Colon cancer Neg Hx     Social History   Socioeconomic History   Marital status: Divorced    Spouse name: Not on file   Number of children: Not on file   Years of education: Not on file   Highest education level: Not on file  Occupational History   Not on file  Tobacco Use   Smoking status: Never   Smokeless tobacco: Never  Vaping Use   Vaping Use: Never used  Substance and Sexual Activity   Alcohol use: Not Currently   Drug use: No   Sexual activity: Not on file  Other Topics Concern   Not on file  Social History Narrative   Not on file   Social Determinants of Health   Financial Resource Strain: Not on file  Food Insecurity: Not on file  Transportation Needs: Not on file  Physical Activity: Not on file  Stress: Not on file  Social Connections: Not on file  Intimate Partner Violence: Not on file    Review of Systems  Constitutional: Negative.   HENT: Negative.    Eyes: Negative.   Respiratory: Negative.    Cardiovascular: Negative.   Gastrointestinal: Negative.   Genitourinary: Negative.   Musculoskeletal: Negative.   Skin: Negative.   Neurological: Negative.   Endo/Heme/Allergies: Negative.   Psychiatric/Behavioral: Negative.    All other systems reviewed and are negative.       Objective    BP 122/84   Pulse 92   Temp 98.8 F (37.1 C) (Oral)   Ht 5\' 1"  (1.549 m)   Wt 141 lb (64 kg)   SpO2 99%   BMI 26.64 kg/m   Physical Exam Vitals and nursing note reviewed.  Constitutional:      Appearance: Normal  appearance. She is normal weight.  HENT:     Head: Normocephalic and atraumatic.     Right Ear: Tympanic membrane, ear canal and external ear normal.     Left Ear: Tympanic membrane, ear canal and external ear normal.     Nose: Nose normal.     Mouth/Throat:     Mouth: Mucous membranes are moist.     Pharynx: Oropharynx is clear.  Eyes:     Extraocular Movements: Extraocular movements intact.     Conjunctiva/sclera: Conjunctivae normal.     Pupils: Pupils are equal, round, and reactive to light.  Cardiovascular:     Rate and Rhythm: Normal rate and regular rhythm.     Pulses: Normal  pulses.     Heart sounds: Normal heart sounds.  Pulmonary:     Effort: Pulmonary effort is normal.     Breath sounds: Normal breath sounds.  Abdominal:     General: Bowel sounds are normal.     Palpations: Abdomen is soft.  Musculoskeletal:        General: Normal range of motion.     Cervical back: Normal range of motion and neck supple.  Skin:    General: Skin is warm and dry.     Capillary Refill: Capillary refill takes less than 2 seconds.  Neurological:     General: No focal deficit present.     Mental Status: She is alert and oriented to person, place, and time. Mental status is at baseline.  Psychiatric:        Mood and Affect: Mood normal.        Behavior: Behavior normal.        Thought Content: Thought content normal.        Judgment: Judgment normal.         Assessment & Plan:   Problem List Items Addressed This Visit     Anxiety    GAD 10. Reports well controlled on Xanax 0.5mg  BID.      Depressive disorder    PHQ 1      Attention deficit hyperactivity disorder (ADHD)    Well controlled on Adderall XR  daily. Follow up in 3 months.      Encounter to establish care with new doctor - Primary    Today your medical history and current concerns were reviewed. Discussed labs results and current medication regimen. HM items up to date. Will return to office for physical  with labs in 1 year.      Moderate mixed hyperlipidemia not requiring statin therapy    ASCVD 0.8%. Encouraged heart healthy diet and 150 minutes moderate intensity exercise weekly.       Return in about 3 months (around 07/16/2023) for ADD.   Park Meo, FNP

## 2023-04-16 NOTE — Assessment & Plan Note (Signed)
Today your medical history and current concerns were reviewed. Discussed labs results and current medication regimen. HM items up to date. Will return to office for physical with labs in 1 year.

## 2023-04-24 ENCOUNTER — Encounter (INDEPENDENT_AMBULATORY_CARE_PROVIDER_SITE_OTHER): Payer: Self-pay | Admitting: *Deleted

## 2023-05-14 ENCOUNTER — Telehealth (INDEPENDENT_AMBULATORY_CARE_PROVIDER_SITE_OTHER): Payer: Self-pay | Admitting: Gastroenterology

## 2023-05-14 NOTE — Telephone Encounter (Signed)
Who is your primary care physician: Dr.Halls office  Reasons for the colonoscopy: hx polyps  Have you had a colonoscopy before?  Yes 05/21/22  Do you have family history of colon cancer? no  Previous colonoscopy with polyps removed? Yes (about 25)  Do you have a history colorectal cancer?   no  Are you diabetic? If yes, Type 1 or Type 2?    no  Do you have a prosthetic or mechanical heart valve? no  Do you have a pacemaker/defibrillator?   no  Have you had endocarditis/atrial fibrillation? no  Have you had joint replacement within the last 12 months?  no  Do you tend to be constipated or have to use laxatives? Sometimes but only uses Metamucil  Do you have any history of drugs or alchohol?  no  Do you use supplemental oxygen?  no  Have you had a stroke or heart attack within the last 6 months? no  Do you take weight loss medication?  no  For female patients: have you had a hysterectomy?  yes                                     are you post menopausal?       no                                            do you still have your menstrual cycle? no      Do you take any blood-thinning medications such as: (aspirin, warfarin, Plavix, Aggrenox)  no  If yes we need the name, milligram, dosage and who is prescribing doctor  Current Outpatient Medications on File Prior to Visit  Medication Sig Dispense Refill   acyclovir (ZOVIRAX) 400 MG tablet Take 1 tablet (400 mg total) by mouth 2 (two) times daily. (Patient taking differently: Take 400 mg by mouth 2 (two) times daily as needed (cold sores).) 180 tablet 2   ALPRAZolam (XANAX) 0.5 MG tablet Take 1 tablet (0.5 mg total) by mouth 2 (two) times daily as needed for anxiety. 30 tablet 0   amphetamine-dextroamphetamine (ADDERALL XR) 25 MG 24 hr capsule Take 25 mg by mouth every morning.     cycloSPORINE (RESTASIS) 0.05 % ophthalmic emulsion Place 1 drop into both eyes 2 (two) times daily.     DOTTI 0.05 MG/24HR patch Place 1 patch onto  the skin 2 (two) times a week.     fluticasone (FLONASE) 50 MCG/ACT nasal spray Place 1 spray into both nostrils daily as needed for allergies.     meclizine (ANTIVERT) 12.5 MG tablet Take 1 tablet (12.5 mg total) by mouth 3 (three) times daily as needed for dizziness. 20 tablet 0   Multiple Vitamins-Calcium (ONE-A-DAY WOMENS FORMULA PO) Take 1 drop by mouth daily.     pantoprazole (PROTONIX) 40 MG tablet Take 1 tablet (40 mg total) by mouth 2 (two) times daily. 60 tablet 0   Rimegepant Sulfate 75 MG TBDP Take 75 mg by mouth daily as needed (Migraines).     tretinoin (RETIN-A) 0.025 % gel Apply topically at bedtime. 45 g 0   VAGIFEM 10 MCG TABS vaginal tablet Place 1 tablet vaginally at bedtime.     No current facility-administered medications on file prior to visit.    Allergies  Allergen  Reactions   Prozac [Fluoxetine] Other (See Comments)    HEADACHES   Wellbutrin [Bupropion] Other (See Comments)    NIGHTMARES   Cephalexin Rash   Ciprofloxacin Rash   Relafen [Nabumetone] Other (See Comments)    UNKNOWN     Pharmacy: [facility]  Primary Insurance Name: BCBS  Best number where you can be reached: [phone]

## 2023-05-14 NOTE — Telephone Encounter (Signed)
Any room Thanks 

## 2023-05-15 ENCOUNTER — Encounter: Payer: Self-pay | Admitting: Family Medicine

## 2023-05-15 MED ORDER — PEG 3350-KCL-NA BICARB-NACL 420 G PO SOLR: 4000.0000 mL | Freq: Once | ORAL | 0 refills | Status: AC

## 2023-05-15 NOTE — Telephone Encounter (Signed)
Questionnaire from recall, no referral needed  

## 2023-05-15 NOTE — Telephone Encounter (Signed)
Pt contacted and scheduled TCS for 06/18/23 @ 9AM. Prep sent to pharmacy. Instructions sent via my chart. Contacted insurance and spoke with Thayer Ohm R-no PA needed but must meet medical requirements. Gave pt CPT code and ICD 10 code in case she needed to verify insurance.

## 2023-05-15 NOTE — Addendum Note (Signed)
Addended by: Marlowe Shores on: 05/15/2023 09:03 AM   Modules accepted: Orders

## 2023-05-18 ENCOUNTER — Telehealth: Payer: Self-pay

## 2023-05-18 NOTE — Telephone Encounter (Signed)
LVM for pt to call to make an appt w/Amber,FNP to discuss medication options.   If pt call, please make appt.

## 2023-05-19 ENCOUNTER — Encounter: Payer: Self-pay | Admitting: Family Medicine

## 2023-05-19 ENCOUNTER — Telehealth: Payer: Self-pay

## 2023-05-19 NOTE — Telephone Encounter (Signed)
FYI:  spoke w/pt re: amphetamine-dextroamphetamine (ADDERALL XR) , per pt she will start to take her meds a little later like at 8am vs 5am, hoping that will last longer through the day.

## 2023-05-19 NOTE — Telephone Encounter (Signed)
Pt returned call:   FYI:  spoke w/pt re: amphetamine-dextroamphetamine (ADDERALL XR) , per pt she will start to take her meds a little later like at 8am vs 5am, hoping that will last longer through the day.

## 2023-05-21 ENCOUNTER — Telehealth: Payer: Self-pay

## 2023-05-21 ENCOUNTER — Other Ambulatory Visit: Payer: Self-pay | Admitting: Family Medicine

## 2023-05-21 MED ORDER — AMPHETAMINE-DEXTROAMPHET ER 25 MG PO CP24: 25.0000 mg | ORAL_CAPSULE | ORAL | 0 refills | Status: DC

## 2023-05-21 NOTE — Telephone Encounter (Signed)
Pt called in to request a refill of this med amphetamine-dextroamphetamine (ADDERALL XR) 25 MG 24 hr capsule [[id number]].   LOV: 04/16/23  PHARMACY: [facility]  CB#: [phone]

## 2023-06-10 ENCOUNTER — Encounter (INDEPENDENT_AMBULATORY_CARE_PROVIDER_SITE_OTHER): Payer: Self-pay

## 2023-06-10 ENCOUNTER — Telehealth: Payer: Self-pay | Admitting: Genetic Counselor

## 2023-06-10 ENCOUNTER — Telehealth (INDEPENDENT_AMBULATORY_CARE_PROVIDER_SITE_OTHER): Payer: Self-pay

## 2023-06-10 NOTE — Telephone Encounter (Signed)
Per Crystal, Dr. Wilburt Finlay office plans to file PA and conduct peer to peer if necessary.  Called Sandra Parker to let her know.  Encouraged her to follow up with Dr. Wilburt Finlay office with questions/updates.

## 2023-06-10 NOTE — Telephone Encounter (Signed)
Pt contacted office and would like to reschedule TCS. Pt has been rescheduled to 07/03/23. Updated instructions sent to my chart.

## 2023-06-10 NOTE — Telephone Encounter (Signed)
Spoke with insurance and they state no PA is needed but has to meet medical requirements. Informed rep what was going on and I was transferred to claims and benefits. Spoke with Spike(?) gave dx code Z15.09 and he states that code is not covered. Can do PA directly by calling [phone]. Will also try to do PA on [employer].   Should we use Z15.09 as diagnosis code? If not, what dx code should be used? FYI- pt rescheduled TCS from 06/18/23 to 07/03/23.  Please advise. Thank you

## 2023-06-10 NOTE — Telephone Encounter (Signed)
Please send the request with the following three codes altogether Z15.09, D12.9, V84.09 and Z86.010. If still rejected for coverage, please request a peer to peer ASAP. Thanks

## 2023-06-10 NOTE — Telephone Encounter (Signed)
Sandra Parker made aware we will try to address this and she will notify the patient we are trying to get this resolved.

## 2023-06-10 NOTE — Telephone Encounter (Signed)
Thanks so much. 

## 2023-06-10 NOTE — Telephone Encounter (Signed)
Absolutely, we need to discuss this with with her insurance. It is unacceptable she will only be covered every 10 years given her increased risk of colon cancer. Kenney Houseman, can we ask for a PA auth ? If a peer to peer is needed, please set up one after 330 PM Wednesday to Friday. Thanks

## 2023-06-10 NOTE — Telephone Encounter (Signed)
Lyla Son returned call and I have advised her of update. Lyla Son asked had pt been called and I had told not at this time but I would.  Contacted patient and made her aware of update. Pt verbalized understanding.

## 2023-06-10 NOTE — Telephone Encounter (Signed)
Patient called with concerns about cost of colonoscopies.  Says her insurance company will only cover colonoscopies every 10 years.  Patient says she is considering cancelling her upcoming colonoscopy.  Discussed her diagnosis of MUTYH-associated polyposis puts her at high risk for colon cancer (70-90% lifetime chance of colon cancer if polyposis left untreated) and coloscopies every 1-2 years is medically necessary.  Strongly encouraged her to have colonoscopies as recommended by GI team.   Contacted Dr. Wilburt Finlay office and spoke with nurse Crystal.  Per Sandra Parker, she will speak with Dr. Wilburt Finlay about next steps in requesting authorization for annual colonoscopies.

## 2023-06-10 NOTE — Telephone Encounter (Signed)
Your welcome!  Also left message on Lyla Son [facility]) voicemail to give her update.

## 2023-06-10 NOTE — Telephone Encounter (Signed)
Spoke with insurance and gave codes as listed below in message. Insurance states they can bill this as medically necessary. Peer to peer not needed at this time unless something changes. Codes are billable.  Reference [id number]

## 2023-06-10 NOTE — Telephone Encounter (Signed)
Vonna Drafts with [facility] [phone] called today regarding the patient and her upcoming TCS. Per Lyla Son the patient has a diagnosis of Mutyh associated polyposis and has a 90 % chance of developing Colon cancer in her life time. She says the patient is thinking of cancelling her tcs as it is going to cost her 3000.00 and insurance says they only pay for a Tcs every ten years. Lyla Son would like to know if there is a possible way we can let the insurance know the circumstances, either through a PA or a Peer to Peer. Please advise.

## 2023-06-23 ENCOUNTER — Telehealth: Payer: Self-pay

## 2023-06-23 NOTE — Telephone Encounter (Signed)
Prescription Request  06/23/2023  LOV: 04/16/23  What is the name of the medication or equipment? amphetamine-dextroamphetamine (ADDERALL XR) 25 MG 24 hr capsule  Have you contacted your pharmacy to request a refill? No   Which pharmacy would you like this sent to?  [facility] - [place], [place] - [address], [place] [address] Phone: [phone]  Fax: [phone]   Patient notified that their request is being sent to the clinical staff for review and that they should receive a response within 2 business days.   Please advise at [facility] [phone]

## 2023-06-30 ENCOUNTER — Other Ambulatory Visit: Payer: Self-pay | Admitting: Family Medicine

## 2023-06-30 MED ORDER — AMPHETAMINE-DEXTROAMPHET ER 25 MG PO CP24: 25.0000 mg | ORAL_CAPSULE | ORAL | 0 refills | Status: DC

## 2023-07-01 ENCOUNTER — Telehealth (INDEPENDENT_AMBULATORY_CARE_PROVIDER_SITE_OTHER): Payer: Self-pay | Admitting: Gastroenterology

## 2023-07-01 NOTE — Telephone Encounter (Signed)
Pt called in and wanted to reschedule TCS. Pt originally on for 07/03/23 with Dr.Castaneda. Pt wanted another Friday; no other Fridays available. Pt would like to be called when August schedule is ready. Offered Dr.Ahmed but pt is most comfortable with Dr.Castaneda.

## 2023-07-03 ENCOUNTER — Ambulatory Visit (HOSPITAL_COMMUNITY)
Admission: RE | Admit: 2023-07-03 | Payer: BC Managed Care – PPO | Source: Home / Self Care | Admitting: Gastroenterology

## 2023-07-03 ENCOUNTER — Encounter (HOSPITAL_COMMUNITY): Admission: RE | Payer: Self-pay | Source: Home / Self Care

## 2023-07-03 SURGERY — COLONOSCOPY WITH PROPOFOL
Anesthesia: Monitor Anesthesia Care

## 2023-07-21 MED ORDER — PEG 3350-KCL-NA BICARB-NACL 420 G PO SOLR: 4000.0000 mL | Freq: Once | ORAL | 0 refills | Status: AC

## 2023-07-21 NOTE — Addendum Note (Signed)
Addended by: Marlowe Shores on: 07/21/2023 09:39 AM   Modules accepted: Orders

## 2023-07-21 NOTE — Telephone Encounter (Signed)
Pt called into office and asked about August schedule. Pt has been scheduled for 08/07/23 at 8 am . Prep sent to pharmacy. Instructions sent via my chart. Pt will need pre op. Will call with pre op.

## 2023-07-24 ENCOUNTER — Encounter (INDEPENDENT_AMBULATORY_CARE_PROVIDER_SITE_OTHER): Payer: Self-pay

## 2023-07-29 ENCOUNTER — Encounter (INDEPENDENT_AMBULATORY_CARE_PROVIDER_SITE_OTHER): Payer: Self-pay

## 2023-07-30 ENCOUNTER — Telehealth: Payer: Self-pay | Admitting: Genetic Counselor

## 2023-07-30 ENCOUNTER — Telehealth (INDEPENDENT_AMBULATORY_CARE_PROVIDER_SITE_OTHER): Payer: Self-pay | Admitting: Gastroenterology

## 2023-07-30 ENCOUNTER — Encounter (INDEPENDENT_AMBULATORY_CARE_PROVIDER_SITE_OTHER): Payer: Self-pay

## 2023-07-30 NOTE — Telephone Encounter (Signed)
Left message at pre service center to see if they could help with what I need to do next. Do I need to update the case, do another PA, update codes with them, etc.

## 2023-07-30 NOTE — Telephone Encounter (Signed)
error 

## 2023-07-30 NOTE — Telephone Encounter (Signed)
Received following message from patient: " have my colonoscopy next Friday. I have spoke with Doctor office and she has gave me sone codes that I think she had got from you, however the Hospital called with my quote for this and it's $1,500 but subject to change.  So I called insurance company and they stated to my that it would need to submitted preventive / preventive with that code 96045. The code Z86.010 is throwing it to be diagnosis.  If I skip this year and maybe try doing every 2 years I might could afford to it."   Spoke with Dr. Wilburt Finlay office who stated they will continue to explore appropriate codes.  Patient has already been in contact with Dr. Wilburt Finlay office.    Told patient that it is best to address questions about codes to Dr. Wilburt Finlay office.  Provided financial support information through [facility].  Encouraged her to speak with Dr. Levon Hedger about alternative screening plan if she decides that that annual colonoscopies are not feasible at this time.

## 2023-07-30 NOTE — Telephone Encounter (Signed)
Can we add diagnosis  Z12.11 to it? This is for screening for colorectal cancer. Thanks   You routed conversation to [employer], [place], MD2 hours ago (12:28 PM)   Maleiya R Annabell Howells  You 2 hours ago (11:57 AM)   AW Halina Andreas, I just got off the phone with pre services. They are telling me that the codes that was submitted  Z86.010 is a diagnostic code that is what is making the cost be so much. The insurance co said that it would have to be submitted  prevented/prevented.  Can you tell me what I need  to do    Roselani R Orner  You3 hours ago (10:51 AM)   AW Thank you so much.  Messages that only contain a simple "Thank you" are hidden in In Basket to save you clicks.   You  Nan R Wrenn5 hours ago (9:29 AM)    Good morning Marguriete. The hospital has changed you to a phone visit for your pre op. Thank you.

## 2023-07-31 ENCOUNTER — Encounter (HOSPITAL_COMMUNITY): Admission: RE | Admit: 2023-07-31 | Payer: BC Managed Care – PPO | Source: Ambulatory Visit

## 2023-07-31 ENCOUNTER — Encounter (HOSPITAL_COMMUNITY)
Admission: RE | Admit: 2023-07-31 | Discharge: 2023-07-31 | Disposition: A | Discharge status: Home / Self Care | Payer: BC Managed Care – PPO | Source: Ambulatory Visit | Attending: Gastroenterology | Admitting: Gastroenterology

## 2023-07-31 ENCOUNTER — Encounter (HOSPITAL_COMMUNITY): Payer: Self-pay

## 2023-07-31 ENCOUNTER — Other Ambulatory Visit: Payer: Self-pay

## 2023-08-07 ENCOUNTER — Other Ambulatory Visit: Payer: Self-pay

## 2023-08-07 ENCOUNTER — Encounter (HOSPITAL_COMMUNITY): Payer: Self-pay | Admitting: Gastroenterology

## 2023-08-07 ENCOUNTER — Ambulatory Visit (HOSPITAL_COMMUNITY)
Admission: RE | Admit: 2023-08-07 | Discharge: 2023-08-07 | Disposition: A | Discharge status: Home / Self Care | Payer: BC Managed Care – PPO | Attending: Gastroenterology | Admitting: Gastroenterology

## 2023-08-07 ENCOUNTER — Ambulatory Visit (HOSPITAL_COMMUNITY): Payer: BC Managed Care – PPO | Admitting: Anesthesiology

## 2023-08-07 ENCOUNTER — Encounter (HOSPITAL_COMMUNITY)
Admission: RE | Disposition: A | Discharge status: Home / Self Care | Payer: Self-pay | Source: Home / Self Care | Attending: Gastroenterology

## 2023-08-07 DIAGNOSIS — D123 Benign neoplasm of transverse colon: Secondary | ICD-10-CM | POA: Insufficient documentation

## 2023-08-07 DIAGNOSIS — D120 Benign neoplasm of cecum: Secondary | ICD-10-CM | POA: Diagnosis not present

## 2023-08-07 DIAGNOSIS — K219 Gastro-esophageal reflux disease without esophagitis: Secondary | ICD-10-CM | POA: Diagnosis not present

## 2023-08-07 DIAGNOSIS — Z860100 Personal history of colon polyps, unspecified: Secondary | ICD-10-CM

## 2023-08-07 DIAGNOSIS — K5730 Diverticulosis of large intestine without perforation or abscess without bleeding: Secondary | ICD-10-CM | POA: Insufficient documentation

## 2023-08-07 DIAGNOSIS — D1391 Familial adenomatous polyposis: Secondary | ICD-10-CM

## 2023-08-07 DIAGNOSIS — D125 Benign neoplasm of sigmoid colon: Secondary | ICD-10-CM | POA: Diagnosis not present

## 2023-08-07 DIAGNOSIS — Z1509 Genetic susceptibility to other malignant neoplasm: Secondary | ICD-10-CM | POA: Diagnosis not present

## 2023-08-07 DIAGNOSIS — Z85038 Personal history of other malignant neoplasm of large intestine: Secondary | ICD-10-CM | POA: Insufficient documentation

## 2023-08-07 DIAGNOSIS — Z09 Encounter for follow-up examination after completed treatment for conditions other than malignant neoplasm: Secondary | ICD-10-CM | POA: Diagnosis not present

## 2023-08-07 DIAGNOSIS — D122 Benign neoplasm of ascending colon: Secondary | ICD-10-CM | POA: Diagnosis not present

## 2023-08-07 HISTORY — PX: COLONOSCOPY WITH PROPOFOL: SHX5780

## 2023-08-07 HISTORY — PX: POLYPECTOMY: SHX149

## 2023-08-07 LAB — HM COLONOSCOPY

## 2023-08-07 SURGERY — COLONOSCOPY WITH PROPOFOL
Anesthesia: General

## 2023-08-07 MED ORDER — PROPOFOL 10 MG/ML IV BOLUS
INTRAVENOUS | Status: DC | PRN
Start: 2023-08-07 — End: 2023-08-07
  Administered 2023-08-07: 100 mg via INTRAVENOUS

## 2023-08-07 MED ORDER — LACTATED RINGERS IV SOLN
INTRAVENOUS | Status: DC | PRN
Start: 2023-08-07 — End: 2023-08-07

## 2023-08-07 MED ORDER — LIDOCAINE HCL 1 % IJ SOLN
INTRAMUSCULAR | Status: DC | PRN
  Administered 2023-08-07: 50 mg via INTRADERMAL

## 2023-08-07 MED ORDER — PROPOFOL 500 MG/50ML IV EMUL
INTRAVENOUS | Status: DC | PRN
  Administered 2023-08-07: 125 ug/kg/min via INTRAVENOUS

## 2023-08-07 NOTE — Discharge Instructions (Signed)
You are being discharged to home.  °Resume your previous diet.  °We are waiting for your pathology results.  °Your physician has recommended a repeat colonoscopy in two years for surveillance.  °

## 2023-08-07 NOTE — Op Note (Signed)
[facility] Patient Name: Sandra Parker Procedure Date: 08/07/2023 8:13 AM MRN: [id number] Date of Birth: 1976/11/18 Attending MD: Katrinka Blazing , , [id number] CSN: [id number] Age: 47 Admit Type: Outpatient Procedure:                Colonoscopy Indications:              Surveillance: History of numerous (> 10) adenomas                            on last colonoscopy (< 3 yrs), Personal history of                            MUTYH polyposis colorectal cancer Providers:                Katrinka Blazing, Crystal Page, Pandora Leiter,                            Technician Referring MD:              Medicines:                Monitored Anesthesia Care Complications:            No immediate complications. Estimated Blood Loss:     Estimated blood loss: none. Procedure:                Pre-Anesthesia Assessment:                           - Prior to the procedure, a History and Physical                            was performed, and patient medications, allergies                            and sensitivities were reviewed. The patient's                            tolerance of previous anesthesia was reviewed.                           - The risks and benefits of the procedure and the                            sedation options and risks were discussed with the                            patient. All questions were answered and informed                            consent was obtained.                           - ASA Grade Assessment: II - A patient with mild                            systemic disease.  After obtaining informed consent, the colonoscope                            was passed under direct vision. Throughout the                            procedure, the patient's blood pressure, pulse, and                            oxygen saturations were monitored continuously. The                            [id number]-HQ190L ([id number]) scope was introduced through                             the anus and advanced to the the cecum, identified                            by appendiceal orifice and ileocecal valve. The                            colonoscopy was performed without difficulty. The                            patient tolerated the procedure well. The quality                            of the bowel preparation was excellent. Scope In: 8:24:22 AM Scope Out: 8:41:53 AM Scope Withdrawal Time: 0 hours 12 minutes 54 seconds  Total Procedure Duration: 0 hours 17 minutes 31 seconds  Findings:      The perianal and digital rectal examinations were normal.      Five sessile polyps were found in the ascending colon and cecum. The       polyps were 2 to 6 mm in size. These polyps were removed with a cold       snare. Resection and retrieval were complete.      A 3 mm polyp was found in the transverse colon. The polyp was sessile.       The polyp was removed with a cold snare. Resection and retrieval were       complete.      A 4 mm polyp was found in the sigmoid colon. The polyp was sessile. The       polyp was removed with a cold snare. Resection and retrieval were       complete.      Scattered small-mouthed diverticula were found in the sigmoid colon and       ascending colon.      The retroflexed view of the distal rectum and anal verge was normal and       showed no anal or rectal abnormalities. Impression:               - Five 2 to 6 mm polyps in the ascending colon and                            in the  cecum, removed with a cold snare. Resected                            and retrieved.                           - One 3 mm polyp in the transverse colon, removed                            with a cold snare. Resected and retrieved.                           - One 4 mm polyp in the sigmoid colon, removed with                            a cold snare. Resected and retrieved.                           - Diverticulosis in the sigmoid colon and in the                             ascending colon.                           - The distal rectum and anal verge are normal on                            retroflexion view. Moderate Sedation:      Per Anesthesia Care Recommendation:           - Discharge patient to home (ambulatory).                           - Resume previous diet.                           - Await pathology results.                           - Repeat colonoscopy in 2 years for surveillance. Procedure Code(s):        --- Professional ---                           [phone], Colonoscopy, flexible; with removal of                            tumor(s), polyp(s), or other lesion(s) by snare                            technique Diagnosis Code(s):        --- Professional ---                           Z86.010, Personal history of colonic polyps  D12.2, Benign neoplasm of ascending colon                           D12.0, Benign neoplasm of cecum                           D12.3, Benign neoplasm of transverse colon (hepatic                            flexure or splenic flexure)                           D12.5, Benign neoplasm of sigmoid colon                           Z85.038, Personal history of other malignant                            neoplasm of large intestine                           Z15.09, Genetic susceptibility to other malignant                            neoplasm                           K57.30, Diverticulosis of large intestine without                            perforation or abscess without bleeding CPT copyright 2022 American Medical Association. All rights reserved. The codes documented in this report are preliminary and upon coder review may  be revised to meet current compliance requirements. Katrinka Blazing, MD Katrinka Blazing,  08/07/2023 8:46:01 AM This report has been signed electronically. Number of Addenda: 0

## 2023-08-07 NOTE — Transfer of Care (Signed)
Immediate Anesthesia Transfer of Care Note  Patient: Sandra Parker  Procedure(s) Performed: COLONOSCOPY WITH PROPOFOL POLYPECTOMY INTESTINAL  Patient Location: Short Stay  Anesthesia Type:General  Level of Consciousness: awake  Airway & Oxygen Therapy: Patient Spontanous Breathing  Post-op Assessment: Report given to RN  Post vital signs: Reviewed and stable  Last Vitals:  Vitals Value Taken Time  BP    Temp    Pulse    Resp    SpO2      Last Pain:  Vitals:   08/07/23 0821  TempSrc:   PainSc: 0-No pain         Complications: No notable events documented.

## 2023-08-07 NOTE — H&P (Signed)
Sandra Parker is an 47 y.o. female.   Chief Complaint: MUTYH polyposis, history of colon polyps HPI:  47 y.o. F with PMH MUTYH associated polyposis, depression, GERD, anxiety, coming for MUTYH associated polyposis and history of colon polyps.  The patient denies having any nausea, vomiting, fever, chills, hematochezia, melena, hematemesis, abdominal distention, abdominal pain, diarrhea, jaundice, pruritus or weight loss.  Last colonoscopy she was found to have 28 tubular adenoma polyps.  Past Medical History:  Diagnosis Date   Anxiety    Depression    GERD (gastroesophageal reflux disease)    Macromastia 10/2018   Personal history of colonic polyps 05/23/2022   TMJ (temporomandibular joint syndrome)    Upper respiratory tract infection 11/29/2013    Past Surgical History:  Procedure Laterality Date   APPENDECTOMY     BIOPSY  10/31/2022   Procedure: BIOPSY;  Surgeon: Dolores Frame, MD;  [facility];  Service: Gastroenterology;;   BREAST REDUCTION SURGERY Bilateral 11/22/2018   Procedure: BREAST REDUCTION WITH LIPOSUCTION;  Surgeon: Louisa Second, MD;  Location: [facility];  Service: Plastics;  Laterality: Bilateral;  Bilateral    COLONOSCOPY WITH PROPOFOL N/A 05/21/2022   Procedure: COLONOSCOPY WITH PROPOFOL;  Surgeon: Dolores Frame, MD;  [facility];  Service: Gastroenterology;  Laterality: N/A;  830   ESOPHAGOGASTRODUODENOSCOPY (EGD) WITH PROPOFOL N/A 10/31/2022   Procedure: ESOPHAGOGASTRODUODENOSCOPY (EGD) WITH PROPOFOL;  Surgeon: Dolores Frame, MD;  [facility];  Service: Gastroenterology;  Laterality: N/A;  1230 ASA 2   HYSTEROSCOPY WITH D & C  08/02/2001   LAPAROSCOPIC CHOLECYSTECTOMY  08/16/2010   POLYPECTOMY  05/21/2022   Procedure: POLYPECTOMY INTESTINAL;  Surgeon: Dolores Frame, MD;  [facility];  Service: Gastroenterology;;   POLYPECTOMY  10/31/2022   Procedure:  POLYPECTOMY;  Surgeon: Marguerita Merles, Reuel Boom, MD;  [facility]: [facility];  Service: Gastroenterology;;   REDUCTION MAMMAPLASTY Bilateral    bilateral breast reduction nov 2019   TOTAL ABDOMINAL HYSTERECTOMY W/ BILATERAL SALPINGOOPHORECTOMY Left 04/16/2011    Family History  Problem Relation Age of Onset   Healthy Mother    Healthy Father    Leukemia Maternal Uncle        d. > 50   Colon cancer Neg Hx    Social History:  reports that she has never smoked. She has never used smokeless tobacco. She reports that she does not currently use alcohol. She reports that she does not use drugs.  Allergies:  Allergies  Allergen Reactions   Prozac [Fluoxetine] Other (See Comments)    HEADACHES   Wellbutrin [Bupropion] Other (See Comments)    NIGHTMARES   Cephalexin Rash   Ciprofloxacin Rash   Relafen [Nabumetone] Other (See Comments)    UNKNOWN    Medications Prior to Admission  Medication Sig Dispense Refill   ALPRAZolam (XANAX) 0.5 MG tablet Take 1 tablet (0.5 mg total) by mouth 2 (two) times daily as needed for anxiety. 30 tablet 0   amphetamine-dextroamphetamine (ADDERALL XR) 25 MG 24 hr capsule Take 1 capsule by mouth every morning. 30 capsule 0   cycloSPORINE (RESTASIS) 0.05 % ophthalmic emulsion Place 1 drop into both eyes 2 (two) times daily.     DOTTI 0.05 MG/24HR patch Place 1 patch onto the skin 2 (two) times a week.     fluticasone (FLONASE) 50 MCG/ACT nasal spray Place 1 spray into both nostrils daily as needed for allergies.     Multiple Vitamins-Calcium (ONE-A-DAY WOMENS FORMULA  PO) Take 1 drop by mouth daily.     pantoprazole (PROTONIX) 40 MG tablet Take 1 tablet (40 mg total) by mouth 2 (two) times daily. 60 tablet 0   tretinoin (RETIN-A) 0.025 % gel Apply topically at bedtime. 45 g 0   VAGIFEM 10 MCG TABS vaginal tablet Place 1 tablet vaginally at bedtime.     acyclovir (ZOVIRAX) 400 MG tablet Take 1 tablet (400 mg total) by mouth 2 (two) times daily. (Patient  taking differently: Take 400 mg by mouth 2 (two) times daily as needed (cold sores).) 180 tablet 2   meclizine (ANTIVERT) 12.5 MG tablet Take 1 tablet (12.5 mg total) by mouth 3 (three) times daily as needed for dizziness. 20 tablet 0   Rimegepant Sulfate 75 MG TBDP Take 75 mg by mouth daily as needed (Migraines).      No results found for this or any previous visit (from the past 48 hour(s)). No results found.  Review of Systems  All other systems reviewed and are negative.   Blood pressure 121/72, pulse 77, temperature 97.9 F (36.6 C), temperature source Oral, resp. rate 17, height 5\' 1"  (1.549 m), weight 64 kg, SpO2 100%. Physical Exam  GENERAL: The patient is AO x3, in no acute distress. HEENT: Head is normocephalic and atraumatic. EOMI are intact. Mouth is well hydrated and without lesions. NECK: Supple. No masses LUNGS: Clear to auscultation. No presence of rhonchi/wheezing/rales. Adequate chest expansion HEART: RRR, normal s1 and s2. ABDOMEN: Soft, nontender, no guarding, no peritoneal signs, and nondistended. BS +. No masses. EXTREMITIES: Without any cyanosis, clubbing, rash, lesions or edema. NEUROLOGIC: AOx3, no focal motor deficit. SKIN: no jaundice, no rashes  Assessment/Plan 47 y.o. F with PMH MUTYH associated polyposis, depression, GERD, anxiety, coming for MUTYH associated polyposis and history of colon polyps.  Will proceed with colonoscopy.  Dolores Frame, MD 08/07/2023, 7:27 AM

## 2023-08-07 NOTE — Anesthesia Preprocedure Evaluation (Signed)
Anesthesia Evaluation  Patient identified by MRN, date of birth, ID band Patient awake    Reviewed: Allergy & Precautions, H&P , NPO status , Patient's Chart, lab work & pertinent test results  Airway Mallampati: II  TM Distance: >3 FB Neck ROM: Full   Comment: TMJ syndrome Dental  (+) Dental Advisory Given, Teeth Intact   Pulmonary neg pulmonary ROS   Pulmonary exam normal breath sounds clear to auscultation       Cardiovascular negative cardio ROS Normal cardiovascular exam Rhythm:Regular Rate:Normal     Neuro/Psych  Headaches PSYCHIATRIC DISORDERS Anxiety Depression       GI/Hepatic Neg liver ROS,GERD  Medicated,,  Endo/Other  negative endocrine ROS    Renal/GU negative Renal ROS  negative genitourinary   Musculoskeletal negative musculoskeletal ROS (+)    Abdominal   Peds negative pediatric ROS (+) ADHD Hematology negative hematology ROS (+)   Anesthesia Other Findings   Reproductive/Obstetrics negative OB ROS                             Anesthesia Physical Anesthesia Plan  ASA: 2  Anesthesia Plan: General   Post-op Pain Management: Minimal or no pain anticipated   Induction: Intravenous  PONV Risk Score and Plan: 1 and Propofol infusion  Airway Management Planned: Nasal Cannula and Natural Airway  Additional Equipment:   Intra-op Plan:   Post-operative Plan:   Informed Consent: I have reviewed the patients History and Physical, chart, labs and discussed the procedure including the risks, benefits and alternatives for the proposed anesthesia with the patient or authorized representative who has indicated his/her understanding and acceptance.     Dental advisory given  Plan Discussed with: CRNA and Surgeon  Anesthesia Plan Comments:        Anesthesia Quick Evaluation

## 2023-08-07 NOTE — Anesthesia Postprocedure Evaluation (Signed)
Anesthesia Post Note  Patient: Sandra Parker  Procedure(s) Performed: COLONOSCOPY WITH PROPOFOL POLYPECTOMY INTESTINAL  Patient location during evaluation: Short Stay Anesthesia Type: General Level of consciousness: awake and alert Pain management: pain level controlled Vital Signs Assessment: post-procedure vital signs reviewed and stable Respiratory status: spontaneous breathing Cardiovascular status: blood pressure returned to baseline and stable Anesthetic complications: no   No notable events documented.   Last Vitals:  Vitals:   08/07/23 0704  BP: 121/72  Pulse: 77  Resp: 17  Temp: 36.6 C  SpO2: 100%    Last Pain:  Vitals:   08/07/23 0821  TempSrc:   PainSc: 0-No pain                 Lynix Bonine

## 2023-08-10 ENCOUNTER — Encounter (INDEPENDENT_AMBULATORY_CARE_PROVIDER_SITE_OTHER): Payer: Self-pay | Admitting: *Deleted

## 2023-08-11 ENCOUNTER — Encounter (INDEPENDENT_AMBULATORY_CARE_PROVIDER_SITE_OTHER): Payer: Self-pay | Admitting: *Deleted

## 2023-08-11 DIAGNOSIS — L659 Nonscarring hair loss, unspecified: Secondary | ICD-10-CM | POA: Insufficient documentation

## 2023-08-13 ENCOUNTER — Encounter (HOSPITAL_COMMUNITY): Payer: Self-pay | Admitting: Gastroenterology

## 2023-08-17 ENCOUNTER — Ambulatory Visit: Payer: BC Managed Care – PPO | Admitting: Family Medicine

## 2023-08-17 ENCOUNTER — Encounter: Payer: Self-pay | Admitting: Family Medicine

## 2023-08-17 VITALS — BP 120/90 | HR 87 | Temp 97.6°F | Ht 61.0 in | Wt 142.0 lb

## 2023-08-17 DIAGNOSIS — N3000 Acute cystitis without hematuria: Secondary | ICD-10-CM | POA: Insufficient documentation

## 2023-08-17 DIAGNOSIS — N898 Other specified noninflammatory disorders of vagina: Secondary | ICD-10-CM | POA: Diagnosis not present

## 2023-08-17 DIAGNOSIS — R300 Dysuria: Secondary | ICD-10-CM

## 2023-08-17 DIAGNOSIS — E782 Mixed hyperlipidemia: Secondary | ICD-10-CM

## 2023-08-17 LAB — WET PREP FOR TRICH, YEAST, CLUE

## 2023-08-17 LAB — URINALYSIS, ROUTINE W REFLEX MICROSCOPIC
Bilirubin Urine: NEGATIVE
Casts: NONE SEEN /LPF
Crystals: NONE SEEN /HPF
Glucose, UA: NEGATIVE
Ketones, ur: NEGATIVE
Leukocytes,Ua: NEGATIVE
Nitrite: NEGATIVE
Protein, ur: NEGATIVE
Specific Gravity, Urine: 1.01 (ref 1.001–1.035)
Yeast: NONE SEEN /HPF
pH: 6.5 (ref 5.0–8.0)

## 2023-08-17 LAB — MICROSCOPIC MESSAGE

## 2023-08-17 MED ORDER — FLUCONAZOLE 150 MG PO TABS: 150.0000 mg | ORAL_TABLET | Freq: Once | ORAL | 0 refills | Status: AC

## 2023-08-17 MED ORDER — NITROFURANTOIN MONOHYD MACRO 100 MG PO CAPS: 100.0000 mg | ORAL_CAPSULE | Freq: Two times a day (BID) | ORAL | 0 refills | Status: AC

## 2023-08-17 NOTE — Assessment & Plan Note (Addendum)
Wet prep showed WBCs and bacteria only. Urine dipstick shows positive for RBC's.  Micro exam: 0-5 WBC's per HPF, 0-2 RBC's per HPF, few+ bacteria, and contaminated specimen. Will treat with Macrobid 100mg  BID for 5 days. Provided with Fluconazole 150mg  once for history of vaginal yeast infections with antibiotic use and instructed to fill for symptoms of yeast infection only. Return to office if symptoms persist or worsen.

## 2023-08-17 NOTE — Progress Notes (Signed)
Subjective:  HPI: Sandra Parker is a 47 y.o. female presenting on 08/17/2023 for Urinary Tract Infection   Urinary Tract Infection    Patient is in today for 1 week of dysuria, vaginal itching, urinary frequency, and lower back pain. Denies hematuria, fever, chills, body aches, abdominal pain, discharge. Had tried Monistat with only mild relief.  Review of Systems  All other systems reviewed and are negative.   Relevant past medical history reviewed and updated as indicated.   Past Medical History:  Diagnosis Date   Anxiety    Depression    GERD (gastroesophageal reflux disease)    Macromastia 10/2018   Personal history of colonic polyps 05/23/2022   TMJ (temporomandibular joint syndrome)    Upper respiratory tract infection 11/29/2013     Past Surgical History:  Procedure Laterality Date   APPENDECTOMY     BIOPSY  10/31/2022   Procedure: BIOPSY;  Surgeon: Dolores Frame, MD;  [facility];  Service: Gastroenterology;;   BREAST REDUCTION SURGERY Bilateral 11/22/2018   Procedure: BREAST REDUCTION WITH LIPOSUCTION;  Surgeon: Louisa Second, MD;  Location: [facility];  Service: Plastics;  Laterality: Bilateral;  Bilateral    COLONOSCOPY WITH PROPOFOL N/A 05/21/2022   Procedure: COLONOSCOPY WITH PROPOFOL;  Surgeon: Dolores Frame, MD;  [facility];  Service: Gastroenterology;  Laterality: N/A;  830   COLONOSCOPY WITH PROPOFOL N/A 08/07/2023   Procedure: COLONOSCOPY WITH PROPOFOL;  Surgeon: Dolores Frame, MD;  [facility];  Service: Gastroenterology;  Laterality: N/A;  8:00am;asa any   ESOPHAGOGASTRODUODENOSCOPY (EGD) WITH PROPOFOL N/A 10/31/2022   Procedure: ESOPHAGOGASTRODUODENOSCOPY (EGD) WITH PROPOFOL;  Surgeon: Dolores Frame, MD;  [facility];  Service: Gastroenterology;  Laterality: N/A;  1230 ASA 2   HYSTEROSCOPY WITH D & C  08/02/2001   LAPAROSCOPIC  CHOLECYSTECTOMY  08/16/2010   POLYPECTOMY  05/21/2022   Procedure: POLYPECTOMY INTESTINAL;  Surgeon: Dolores Frame, MD;  [facility];  Service: Gastroenterology;;   POLYPECTOMY  10/31/2022   Procedure: POLYPECTOMY;  Surgeon: Dolores Frame, MD;  [facility];  Service: Gastroenterology;;   POLYPECTOMY  08/07/2023   Procedure: POLYPECTOMY INTESTINAL;  Surgeon: Marguerita Merles, Reuel Boom, MD;  [facility];  Service: Gastroenterology;;   REDUCTION MAMMAPLASTY Bilateral    bilateral breast reduction nov 2019   TOTAL ABDOMINAL HYSTERECTOMY W/ BILATERAL SALPINGOOPHORECTOMY Left 04/16/2011    Allergies and medications reviewed and updated.   Current Outpatient Medications:    acyclovir (ZOVIRAX) 400 MG tablet, Take 1 tablet (400 mg total) by mouth 2 (two) times daily. (Patient taking differently: Take 400 mg by mouth 2 (two) times daily as needed (cold sores).), Disp: 180 tablet, Rfl: 2   ALPRAZolam (XANAX) 0.5 MG tablet, Take 1 tablet (0.5 mg total) by mouth 2 (two) times daily as needed for anxiety., Disp: 30 tablet, Rfl: 0   amphetamine-dextroamphetamine (ADDERALL XR) 25 MG 24 hr capsule, Take 1 capsule by mouth every morning., Disp: 30 capsule, Rfl: 0   cycloSPORINE (RESTASIS) 0.05 % ophthalmic emulsion, Place 1 drop into both eyes 2 (two) times daily., Disp: , Rfl:    DOTTI 0.05 MG/24HR patch, Place 1 patch onto the skin 2 (two) times a week., Disp: , Rfl:    fluconazole (DIFLUCAN) 150 MG tablet, Take 1 tablet (150 mg total) by mouth once for 1 dose., Disp: 1 tablet, Rfl: 0   fluticasone (FLONASE) 50 MCG/ACT nasal spray, Place 1 spray into  both nostrils daily as needed for allergies., Disp: , Rfl:    meclizine (ANTIVERT) 12.5 MG tablet, Take 1 tablet (12.5 mg total) by mouth 3 (three) times daily as needed for dizziness., Disp: 20 tablet, Rfl: 0   Multiple Vitamins-Calcium (ONE-A-DAY WOMENS FORMULA PO), Take 1 drop by mouth daily., Disp: ,  Rfl:    nitrofurantoin, macrocrystal-monohydrate, (MACROBID) 100 MG capsule, Take 1 capsule (100 mg total) by mouth 2 (two) times daily for 5 days., Disp: 10 capsule, Rfl: 0   pantoprazole (PROTONIX) 40 MG tablet, Take 1 tablet (40 mg total) by mouth 2 (two) times daily., Disp: 60 tablet, Rfl: 0   Rimegepant Sulfate 75 MG TBDP, Take 75 mg by mouth daily as needed (Migraines)., Disp: , Rfl:    tretinoin (RETIN-A) 0.025 % gel, Apply topically at bedtime., Disp: 45 g, Rfl: 0   VAGIFEM 10 MCG TABS vaginal tablet, Place 1 tablet vaginally at bedtime., Disp: , Rfl:   Allergies  Allergen Reactions   Prozac [Fluoxetine] Other (See Comments)    HEADACHES   Wellbutrin [Bupropion] Other (See Comments)    NIGHTMARES   Cephalexin Rash   Ciprofloxacin Rash   Relafen [Nabumetone] Other (See Comments)    UNKNOWN    Objective:   BP (!) 120/90   Pulse 87   Temp 97.6 F (36.4 C) (Oral)   Ht 5\' 1"  (1.549 m)   Wt 142 lb (64.4 kg)   SpO2 99%   BMI 26.83 kg/m      08/17/2023    2:40 PM 08/17/2023    2:37 PM 08/07/2023    8:50 AM  Vitals with BMI  Height  5\' 1"    Weight  142 lbs   BMI  26.84   Systolic 120 122 657  Diastolic 90 92 59  Pulse  87 77     Physical Exam Vitals and nursing note reviewed.  Constitutional:      Appearance: Normal appearance. She is normal weight.  HENT:     Head: Normocephalic and atraumatic.  Abdominal:     General: Bowel sounds are normal.     Palpations: Abdomen is soft.     Tenderness: There is no right CVA tenderness or left CVA tenderness.  Skin:    General: Skin is warm and dry.  Neurological:     General: No focal deficit present.     Mental Status: She is alert and oriented to person, place, and time. Mental status is at baseline.  Psychiatric:        Mood and Affect: Mood normal.        Behavior: Behavior normal.        Thought Content: Thought content normal.        Judgment: Judgment normal.     Assessment & Plan:  Acute cystitis without  hematuria Assessment & Plan: Wet prep showed WBCs and bacteria only. Urine dipstick shows positive for RBC's.  Micro exam: 0-5 WBC's per HPF, 0-2 RBC's per HPF, few+ bacteria, and contaminated specimen. Will treat with Macrobid 100mg  BID for 5 days. Provided with Fluconazole 150mg  once for history of vaginal yeast infections with antibiotic use and instructed to fill for symptoms of yeast infection only. Return to office if symptoms persist or worsen.    Dysuria -     Urinalysis, Routine w reflex microscopic; Future  Vaginal itching -     WET PREP FOR TRICH, YEAST, CLUE  Moderate mixed hyperlipidemia not requiring statin therapy -     Lipid  panel  Other orders -     Nitrofurantoin Monohyd Macro; Take 1 capsule (100 mg total) by mouth 2 (two) times daily for 5 days.  Dispense: 10 capsule; Refill: 0 -     Fluconazole; Take 1 tablet (150 mg total) by mouth once for 1 dose.  Dispense: 1 tablet; Refill: 0     Follow up plan: Return if symptoms worsen or fail to improve.  Park Meo, FNP

## 2023-08-18 LAB — LIPID PANEL
Cholesterol: 212 mg/dL — ABNORMAL HIGH (ref <200)
HDL: 63 mg/dL (ref >=50)
LDL Cholesterol (Calc): 124 mg/dL — ABNORMAL HIGH
Non-HDL Cholesterol (Calc): 149 mg/dL — ABNORMAL HIGH (ref <130)
Total CHOL/HDL Ratio: 3.4 (calc) (ref <5.0)
Triglycerides: 136 mg/dL (ref <150)

## 2023-09-02 ENCOUNTER — Other Ambulatory Visit: Payer: Self-pay | Admitting: Family Medicine

## 2023-09-02 ENCOUNTER — Encounter: Payer: Self-pay | Admitting: Family Medicine

## 2023-09-02 DIAGNOSIS — E782 Mixed hyperlipidemia: Secondary | ICD-10-CM

## 2023-09-15 ENCOUNTER — Telehealth: Payer: Self-pay | Admitting: Family Medicine

## 2023-09-15 NOTE — Telephone Encounter (Signed)
Prescription Request  09/15/2023  LOV: 08/17/2023  What is the name of the medication or equipment?   amphetamine-dextroamphetamine (ADDERALL XR) 25 MG 24 hr capsule   Have you contacted your pharmacy to request a refill? Yes   Which pharmacy would you like this sent to?  [facility] - [place], [place] - [address] [place] [address] Phone: [phone] Fax: [phone]    Patient notified that their request is being sent to the clinical staff for review and that they should receive a response within 2 business days.   Please advise patient at [phone].

## 2023-09-16 ENCOUNTER — Other Ambulatory Visit: Payer: Self-pay | Admitting: Family Medicine

## 2023-09-17 ENCOUNTER — Other Ambulatory Visit: Payer: Self-pay | Admitting: Family Medicine

## 2023-09-17 MED ORDER — AMPHETAMINE-DEXTROAMPHET ER 25 MG PO CP24: 25.0000 mg | ORAL_CAPSULE | ORAL | 0 refills | Status: DC

## 2023-09-17 NOTE — Telephone Encounter (Signed)
Patient called back to advise she went to the other provider for a refill because NP Dimas Aguas was out of the office. Patient asked if the visit she had on 08/17/23 would suffice; she left a urine specimen that day.  If that visit will not suffice, patient asked if she can do a virtual appointment for med refills. Patient stated she will be out of pills in about 1 week.  Pharmacy confirmed as:   [facility] - [place], [place] - [address] [place], [place] [address] Phone: [phone]  Fax: [phone]   Please advise patient at [phone].

## 2023-10-07 ENCOUNTER — Encounter: Payer: Self-pay | Admitting: Family Medicine

## 2023-10-07 ENCOUNTER — Other Ambulatory Visit: Payer: Self-pay | Admitting: Family Medicine

## 2023-10-07 DIAGNOSIS — R600 Localized edema: Secondary | ICD-10-CM

## 2023-10-07 NOTE — Telephone Encounter (Signed)
Pt called again concern about possibly DVT in her leg. Pt request if FNP could order to for DVT? Told that she can self-refer to [facility] in [place] to get a STAT DVT done. Per pt will call [phone] and f/u with NP afterwards.   Pt aware and voiced understanding.

## 2023-10-14 ENCOUNTER — Ambulatory Visit: Payer: BC Managed Care – PPO | Admitting: Family Medicine

## 2023-10-21 ENCOUNTER — Ambulatory Visit: Payer: BC Managed Care – PPO | Admitting: Family Medicine

## 2023-10-21 ENCOUNTER — Encounter: Payer: Self-pay | Admitting: Family Medicine

## 2023-10-21 VITALS — BP 130/86 | HR 100 | Temp 98.6°F | Ht 61.0 in | Wt 140.0 lb

## 2023-10-21 DIAGNOSIS — R7989 Other specified abnormal findings of blood chemistry: Secondary | ICD-10-CM | POA: Diagnosis not present

## 2023-10-21 DIAGNOSIS — F909 Attention-deficit hyperactivity disorder, unspecified type: Secondary | ICD-10-CM

## 2023-10-21 DIAGNOSIS — Z79899 Other long term (current) drug therapy: Secondary | ICD-10-CM | POA: Diagnosis not present

## 2023-10-21 MED ORDER — MELATONIN 3 MG PO TABS: 3.0000 mg | ORAL_TABLET | Freq: Every day | ORAL | 0 refills | Status: AC

## 2023-10-21 MED ORDER — AMPHETAMINE-DEXTROAMPHET ER 25 MG PO CP24: 25.0000 mg | ORAL_CAPSULE | ORAL | 0 refills | Status: DC

## 2023-10-21 NOTE — Assessment & Plan Note (Signed)
Has tried testosterone cream and did not tolerate. Reports thinning hair. Would like Endo referral.

## 2023-10-21 NOTE — Progress Notes (Signed)
Subjective:  HPI: Sandra Parker is a 47 y.o. female presenting on 10/21/2023 for Follow-up (Med f/u)   HPI Patient is in today for request for Endocrinology referral. She recently found she has low testosterone levels at her OBGYN, did not tolerate this due to lower extremity swelling, she also reports losing hair. She is also following up for ADHD and adderall refills. She has had ADHD since a child, did resume her Adderall 1 year ago and is doing well on her current dose.   ADHD FOLLOW UP ADHD status: controlled Satisfied with current therapy: yes Medication compliance:  excellent compliance Controlled substance contract:  today Previous psychiatry evaluation: yes Previous medications: no adderall XR   Taking meds on weekends/vacations: no Work/school performance:  excellent Difficulty sustaining attention/completing tasks: no Distracted by extraneous stimuli: no Does not listen when spoken to: no  Fidgets with hands or feet: no Unable to stay in seat: no Blurts out/interrupts others: no ADHD Medication Side Effects: no    Decreased appetite: no    Headache: no    Sleeping disturbance pattern: yes    Irritability: no    Rebound effects (worse than baseline) off medication: no    Anxiousness: no    Dizziness: no    Tics: no   Review of Systems  All other systems reviewed and are negative.   Relevant past medical history reviewed and updated as indicated.   Past Medical History:  Diagnosis Date   Anxiety    Depression    GERD (gastroesophageal reflux disease)    Macromastia 10/2018   Personal history of colonic polyps 05/23/2022   TMJ (temporomandibular joint syndrome)    Upper respiratory tract infection 11/29/2013     Past Surgical History:  Procedure Laterality Date   APPENDECTOMY     BIOPSY  10/31/2022   Procedure: BIOPSY;  Surgeon: Dolores Frame, MD;  Location: [facility];  Service: Gastroenterology;;   BREAST REDUCTION SURGERY Bilateral  11/22/2018   Procedure: BREAST REDUCTION WITH LIPOSUCTION;  Surgeon: Louisa Second, MD;  Location: [facility];  Service: Plastics;  Laterality: Bilateral;  Bilateral    COLONOSCOPY WITH PROPOFOL N/A 05/21/2022   Procedure: COLONOSCOPY WITH PROPOFOL;  Surgeon: Dolores Frame, MD;  Location: [facility];  Service: Gastroenterology;  Laterality: N/A;  830   COLONOSCOPY WITH PROPOFOL N/A 08/07/2023   Procedure: COLONOSCOPY WITH PROPOFOL;  Surgeon: Dolores Frame, MD;  Location: [facility];  Service: Gastroenterology;  Laterality: N/A;  8:00am;asa any   ESOPHAGOGASTRODUODENOSCOPY (EGD) WITH PROPOFOL N/A 10/31/2022   Procedure: ESOPHAGOGASTRODUODENOSCOPY (EGD) WITH PROPOFOL;  Surgeon: Dolores Frame, MD;  Location: [facility];  Service: Gastroenterology;  Laterality: N/A;  1230 ASA 2   HYSTEROSCOPY WITH D & C  08/02/2001   LAPAROSCOPIC CHOLECYSTECTOMY  08/16/2010   POLYPECTOMY  05/21/2022   Procedure: POLYPECTOMY INTESTINAL;  Surgeon: Dolores Frame, MD;  Location: [facility];  Service: Gastroenterology;;   POLYPECTOMY  10/31/2022   Procedure: POLYPECTOMY;  Surgeon: Dolores Frame, MD;  Location: [facility];  Service: Gastroenterology;;   POLYPECTOMY  08/07/2023   Procedure: POLYPECTOMY INTESTINAL;  Surgeon: Marguerita Merles, Reuel Boom, MD;  Location: [facility];  Service: Gastroenterology;;   REDUCTION MAMMAPLASTY Bilateral    bilateral breast reduction nov 2019   TOTAL ABDOMINAL HYSTERECTOMY W/ BILATERAL SALPINGOOPHORECTOMY Left 04/16/2011    Allergies and medications reviewed and updated.   Current Outpatient Medications:    acyclovir (ZOVIRAX) 400 MG tablet, Take 1  tablet (400 mg total) by mouth 2 (two) times daily. (Patient taking differently: Take 400 mg by mouth 2 (two) times daily as needed (cold sores).), Disp: 180 tablet, Rfl: 2   ALPRAZolam (XANAX) 0.5 MG tablet, Take 1 tablet (0.5 mg total) by mouth  2 (two) times daily as needed for anxiety., Disp: 30 tablet, Rfl: 0   cycloSPORINE (RESTASIS) 0.05 % ophthalmic emulsion, Place 1 drop into both eyes 2 (two) times daily., Disp: , Rfl:    DOTTI 0.05 MG/24HR patch, Place 1 patch onto the skin 2 (two) times a week., Disp: , Rfl:    fluticasone (FLONASE) 50 MCG/ACT nasal spray, Place 1 spray into both nostrils daily as needed for allergies., Disp: , Rfl:    meclizine (ANTIVERT) 12.5 MG tablet, Take 1 tablet (12.5 mg total) by mouth 3 (three) times daily as needed for dizziness., Disp: 20 tablet, Rfl: 0   melatonin 3 MG TABS tablet, Take 1 tablet (3 mg total) by mouth at bedtime., Disp: 90 tablet, Rfl: 0   Multiple Vitamins-Calcium (ONE-A-DAY WOMENS FORMULA PO), Take 1 drop by mouth daily., Disp: , Rfl:    pantoprazole (PROTONIX) 40 MG tablet, Take 1 tablet (40 mg total) by mouth 2 (two) times daily., Disp: 60 tablet, Rfl: 0   Rimegepant Sulfate 75 MG TBDP, Take 75 mg by mouth daily as needed (Migraines)., Disp: , Rfl:    tretinoin (RETIN-A) 0.025 % gel, Apply topically at bedtime., Disp: 45 g, Rfl: 0   VAGIFEM 10 MCG TABS vaginal tablet, Place 1 tablet vaginally at bedtime., Disp: , Rfl:    amphetamine-dextroamphetamine (ADDERALL XR) 25 MG 24 hr capsule, Take 1 capsule by mouth every morning., Disp: 30 capsule, Rfl: 0  Allergies  Allergen Reactions   Prozac [Fluoxetine] Other (See Comments)    HEADACHES   Wellbutrin [Bupropion] Other (See Comments)    NIGHTMARES   Cephalexin Rash   Ciprofloxacin Rash   Relafen [Nabumetone] Other (See Comments)    UNKNOWN    Objective:   BP 130/86   Pulse 100   Temp 98.6 F (37 C) (Oral)   Ht 5\' 1"  (1.549 m)   Wt 140 lb (63.5 kg)   SpO2 98%   BMI 26.45 kg/m      10/21/2023    2:51 PM 08/17/2023    2:40 PM 08/17/2023    2:37 PM  Vitals with BMI  Height 5\' 1"   5\' 1"   Weight 140 lbs  142 lbs  BMI 26.47  26.84  Systolic 130 120 202  Diastolic 86 90 92  Pulse 100  87     Physical Exam Vitals  and nursing note reviewed.  Constitutional:      Appearance: Normal appearance. She is normal weight.  HENT:     Head: Normocephalic and atraumatic.  Skin:    General: Skin is warm and dry.  Neurological:     General: No focal deficit present.     Mental Status: She is alert and oriented to person, place, and time. Mental status is at baseline.  Psychiatric:        Mood and Affect: Mood normal.        Behavior: Behavior normal.        Thought Content: Thought content normal.        Judgment: Judgment normal.     Assessment & Plan:  Attention deficit hyperactivity disorder (ADHD), unspecified ADHD type Assessment & Plan: Well controlled on Adderall XR 25mg  daily. Follow up in 3 months.  PDMP reviewed. Controlled substance agreement and UDS completed.  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 Reports thinning hair. Would like Endo referral.  Orders: -     Ambulatory referral to Endocrinology  Other orders -     Melatonin; Take 1 tablet (3 mg total) by mouth at bedtime.  Dispense: 90 tablet; Refill: 0 -     Amphetamine-Dextroamphet ER; Take 1 capsule by mouth every morning.  Dispense: 30 capsule; Refill: 0     Follow up plan: Return in about 3 months (around 01/21/2024) for annual physical with labs 1 week prior, ADHD.  Park Meo, FNP

## 2023-10-21 NOTE — Assessment & Plan Note (Signed)
Well controlled on Adderall XR 25mg  daily. Follow up in 3 months. PDMP reviewed. Controlled substance agreement and UDS completed.

## 2023-10-23 ENCOUNTER — Telehealth: Payer: Self-pay

## 2023-10-23 ENCOUNTER — Encounter (INDEPENDENT_AMBULATORY_CARE_PROVIDER_SITE_OTHER): Payer: Self-pay | Admitting: *Deleted

## 2023-10-23 LAB — DRUG MONITOR, PANEL 1, W/CONF, URINE
Amphetamine: 4506 ng/mL — ABNORMAL HIGH (ref <250)
Amphetamines: POSITIVE ng/mL — AB (ref <500)
Barbiturates: NEGATIVE ng/mL (ref <300)
Benzodiazepines: NEGATIVE ng/mL (ref <100)
Cocaine Metabolite: NEGATIVE ng/mL (ref <150)
Creatinine: 46.4 mg/dL (ref >=20.0)
Marijuana Metabolite: NEGATIVE ng/mL (ref <20)
Methadone Metabolite: NEGATIVE ng/mL (ref <100)
Methamphetamine: NEGATIVE ng/mL (ref <250)
Opiates: NEGATIVE ng/mL (ref <100)
Oxidant: NEGATIVE ug/mL (ref <200)
Oxycodone: NEGATIVE ng/mL (ref <100)
Phencyclidine: NEGATIVE ng/mL (ref <25)
pH: 6.3 (ref 4.5–9.0)

## 2023-10-23 LAB — DM TEMPLATE

## 2023-10-23 NOTE — Telephone Encounter (Signed)
Pt called in stating that office where recent referral was sent in is not accepting new pts. Pt would like for referral to be sent along with current OV notes, last 3 months of labs, and pt's demographic information to be faxed to Dr. Ocie Cornfield. Please advise  Please fax this info to Dr. Ocie Cornfield at [phone]  Please call pt at [phone]

## 2023-10-26 NOTE — Telephone Encounter (Signed)
Put notes and labs up front to be fax to Dr. Roanna Raider.

## 2023-11-12 ENCOUNTER — Other Ambulatory Visit (HOSPITAL_COMMUNITY): Payer: Self-pay | Admitting: Family Medicine

## 2023-11-12 DIAGNOSIS — Z1231 Encounter for screening mammogram for malignant neoplasm of breast: Secondary | ICD-10-CM

## 2023-12-07 ENCOUNTER — Other Ambulatory Visit: Payer: Self-pay | Admitting: Family Medicine

## 2023-12-08 MED ORDER — AMPHETAMINE-DEXTROAMPHET ER 25 MG PO CP24: 25.0000 mg | ORAL_CAPSULE | ORAL | 0 refills | Status: DC

## 2023-12-08 NOTE — Telephone Encounter (Signed)
Copied from CRM [id number]. Topic: Clinical - Medication Question >> Dec 08, 2023  1:44 PM Colletta Maryland S wrote: Reason for CRM: pt is checking on status of amphetamine-dextroamphetamine (ADDERALL XR) 25 MG 24 hr capsule rx being sent to pharmacy, pt runs out of rx 12/12

## 2023-12-28 ENCOUNTER — Encounter: Payer: Self-pay | Admitting: Family Medicine

## 2023-12-28 ENCOUNTER — Ambulatory Visit: Payer: BC Managed Care – PPO | Admitting: Family Medicine

## 2023-12-28 VITALS — BP 120/82 | HR 73 | Temp 98.2°F | Ht 61.0 in | Wt 147.0 lb

## 2023-12-28 DIAGNOSIS — H60503 Unspecified acute noninfective otitis externa, bilateral: Secondary | ICD-10-CM | POA: Diagnosis not present

## 2023-12-28 MED ORDER — NEOMYCIN-POLYMYXIN-HC 1 % OT SOLN: 3.0000 [drp] | Freq: Four times a day (QID) | OTIC | 0 refills | Status: DC

## 2023-12-28 NOTE — Progress Notes (Signed)
Subjective:  HPI: Sandra Parker is a 47 y.o. female presenting on 12/28/2023 for Acute Visit (Pain in right ear--about 3 days, hurts to open mouth)   HPI Patient is in today for right ear pain and pressure that is associated with right jaw pain when opening her mouth for 3 days. She denies ear drainage, hearing loss, fever, chills, body aches. Did have a recent URI, has a history of TMJ. Has tried sinus congestion medications and Ibuprofen.   Review of Systems  All other systems reviewed and are negative.   Relevant past medical history reviewed and updated as indicated.   Past Medical History:  Diagnosis Date   Anxiety    Depression    GERD (gastroesophageal reflux disease)    Macromastia 10/2018   Personal history of colonic polyps 05/23/2022   TMJ (temporomandibular joint syndrome)    Upper respiratory tract infection 11/29/2013     Past Surgical History:  Procedure Laterality Date   APPENDECTOMY     BIOPSY  10/31/2022   Procedure: BIOPSY;  Surgeon: Dolores Frame, MD;  Location: [facility];  Service: Gastroenterology;;   BREAST REDUCTION SURGERY Bilateral 11/22/2018   Procedure: BREAST REDUCTION WITH LIPOSUCTION;  Surgeon: Louisa Second, MD;  Location: [facility];  Service: Plastics;  Laterality: Bilateral;  Bilateral    COLONOSCOPY WITH PROPOFOL N/A 05/21/2022   Procedure: COLONOSCOPY WITH PROPOFOL;  Surgeon: Dolores Frame, MD;  Location: [facility];  Service: Gastroenterology;  Laterality: N/A;  830   COLONOSCOPY WITH PROPOFOL N/A 08/07/2023   Procedure: COLONOSCOPY WITH PROPOFOL;  Surgeon: Dolores Frame, MD;  Location: [facility];  Service: Gastroenterology;  Laterality: N/A;  8:00am;asa any   ESOPHAGOGASTRODUODENOSCOPY (EGD) WITH PROPOFOL N/A 10/31/2022   Procedure: ESOPHAGOGASTRODUODENOSCOPY (EGD) WITH PROPOFOL;  Surgeon: Dolores Frame, MD;  Location: [facility];  Service: Gastroenterology;   Laterality: N/A;  1230 ASA 2   HYSTEROSCOPY WITH D & C  08/02/2001   LAPAROSCOPIC CHOLECYSTECTOMY  08/16/2010   POLYPECTOMY  05/21/2022   Procedure: POLYPECTOMY INTESTINAL;  Surgeon: Dolores Frame, MD;  Location: [facility];  Service: Gastroenterology;;   POLYPECTOMY  10/31/2022   Procedure: POLYPECTOMY;  Surgeon: Dolores Frame, MD;  Location: [facility];  Service: Gastroenterology;;   POLYPECTOMY  08/07/2023   Procedure: POLYPECTOMY INTESTINAL;  Surgeon: Marguerita Merles, Reuel Boom, MD;  Location: [facility];  Service: Gastroenterology;;   REDUCTION MAMMAPLASTY Bilateral    bilateral breast reduction nov 2019   TOTAL ABDOMINAL HYSTERECTOMY W/ BILATERAL SALPINGOOPHORECTOMY Left 04/16/2011    Allergies and medications reviewed and updated.   Current Outpatient Medications:    ALPRAZolam (XANAX) 0.5 MG tablet, Take 1 tablet (0.5 mg total) by mouth 2 (two) times daily as needed for anxiety., Disp: 30 tablet, Rfl: 0   amphetamine-dextroamphetamine (ADDERALL XR) 25 MG 24 hr capsule, Take 1 capsule by mouth every morning., Disp: 30 capsule, Rfl: 0   cycloSPORINE (RESTASIS) 0.05 % ophthalmic emulsion, Place 1 drop into both eyes 2 (two) times daily., Disp: , Rfl:    DOTTI 0.05 MG/24HR patch, Place 1 patch onto the skin 2 (two) times a week., Disp: , Rfl:    fluticasone (FLONASE) 50 MCG/ACT nasal spray, Place 1 spray into both nostrils daily as needed for allergies., Disp: , Rfl:    meclizine (ANTIVERT) 12.5 MG tablet, Take 1 tablet (12.5 mg total) by mouth 3 (three) times daily as needed for dizziness., Disp: 20 tablet, Rfl: 0  melatonin 3 MG TABS tablet, Take 1 tablet (3 mg total) by mouth at bedtime., Disp: 90 tablet, Rfl: 0   Multiple Vitamins-Calcium (ONE-A-DAY WOMENS FORMULA PO), Take 1 drop by mouth daily., Disp: , Rfl:    NEOMYCIN-POLYMYXIN-HYDROCORTISONE (CORTISPORIN) 1 % SOLN OTIC solution, Place 3 drops into both ears 4 (four) times daily., Disp: 10 mL, Rfl:  0   pantoprazole (PROTONIX) 40 MG tablet, Take 1 tablet (40 mg total) by mouth 2 (two) times daily., Disp: 60 tablet, Rfl: 0   Rimegepant Sulfate 75 MG TBDP, Take 75 mg by mouth daily as needed (Migraines)., Disp: , Rfl:    tretinoin (RETIN-A) 0.025 % gel, Apply topically at bedtime., Disp: 45 g, Rfl: 0   VAGIFEM 10 MCG TABS vaginal tablet, Place 1 tablet vaginally at bedtime., Disp: , Rfl:    acyclovir (ZOVIRAX) 400 MG tablet, Take 1 tablet (400 mg total) by mouth 2 (two) times daily. (Patient not taking: Reported on 12/28/2023), Disp: 180 tablet, Rfl: 2  Allergies  Allergen Reactions   Prozac [Fluoxetine] Other (See Comments)    HEADACHES   Wellbutrin [Bupropion] Other (See Comments)    NIGHTMARES   Cephalexin Rash   Ciprofloxacin Rash   Relafen [Nabumetone] Other (See Comments)    UNKNOWN    Objective:   BP 120/82   Pulse 73   Temp 98.2 F (36.8 C) (Oral)   Ht 5\' 1"  (1.549 m)   Wt 147 lb (66.7 kg)   SpO2 97%   BMI 27.78 kg/m      12/28/2023    9:58 AM 12/28/2023    9:34 AM 10/21/2023    2:51 PM  Vitals with BMI  Height  5\' 1"  5\' 1"   Weight  147 lbs 140 lbs  BMI  27.79 26.47  Systolic 120 140 914  Diastolic 82 92 86  Pulse  73 100     Physical Exam Vitals and nursing note reviewed.  Constitutional:      Appearance: Normal appearance. She is normal weight.  HENT:     Head: Normocephalic and atraumatic.     Right Ear: Hearing, tympanic membrane and external ear normal. Tenderness present.     Left Ear: Hearing, tympanic membrane and external ear normal.     Ears:     Comments: Canal erythema bilaterally Skin:    General: Skin is warm and dry.  Neurological:     General: No focal deficit present.     Mental Status: She is alert and oriented to person, place, and time. Mental status is at baseline.  Psychiatric:        Mood and Affect: Mood normal.        Behavior: Behavior normal.        Thought Content: Thought content normal.        Judgment: Judgment  normal.     Assessment & Plan:  Acute otitis externa of both ears, unspecified type Assessment & Plan: Bilateral ear canals are erythematous with tenderness to right ear. Start Cortisporin bilaterally 4 times daily and continue OTC symptomatic management. Return to office if symptoms persist or worsen. If jaw discomfort continues will trial muscle relaxer for possible recurrence of TMJ.   Other orders -     Neomycin-Polymyxin-HC; Place 3 drops into both ears 4 (four) times daily.  Dispense: 10 mL; Refill: 0     Follow up plan: Return if symptoms worsen or fail to improve.  Park Meo, FNP

## 2023-12-28 NOTE — Assessment & Plan Note (Signed)
Bilateral ear canals are erythematous with tenderness to right ear. Start Cortisporin bilaterally 4 times daily and continue OTC symptomatic management. Return to office if symptoms persist or worsen. If jaw discomfort continues will trial muscle relaxer for possible recurrence of TMJ.

## 2024-01-01 ENCOUNTER — Other Ambulatory Visit: Payer: 59

## 2024-01-01 DIAGNOSIS — R748 Abnormal levels of other serum enzymes: Secondary | ICD-10-CM

## 2024-01-01 DIAGNOSIS — E782 Mixed hyperlipidemia: Secondary | ICD-10-CM

## 2024-01-01 DIAGNOSIS — Z13220 Encounter for screening for lipoid disorders: Secondary | ICD-10-CM

## 2024-01-02 LAB — COMPLETE METABOLIC PANEL WITH GFR
AG Ratio: 1.8 (calc) (ref 1.0–2.5)
ALT: 24 U/L (ref 6–29)
AST: 20 U/L (ref 10–35)
Albumin: 4.4 g/dL (ref 3.6–5.1)
Alkaline phosphatase (APISO): 54 U/L (ref 31–125)
BUN: 13 mg/dL (ref 7–25)
CO2: 26 mmol/L (ref 20–32)
Calcium: 9.5 mg/dL (ref 8.6–10.2)
Chloride: 104 mmol/L (ref 98–110)
Creat: 0.85 mg/dL (ref 0.50–0.99)
Globulin: 2.5 g/dL (ref 1.9–3.7)
Glucose, Bld: 96 mg/dL (ref 65–99)
Potassium: 4.4 mmol/L (ref 3.5–5.3)
Sodium: 139 mmol/L (ref 135–146)
Total Bilirubin: 0.6 mg/dL (ref 0.2–1.2)
Total Protein: 6.9 g/dL (ref 6.1–8.1)
eGFR: 84 mL/min/{1.73_m2} (ref >=60)

## 2024-01-02 LAB — CBC WITH DIFFERENTIAL/PLATELET
Absolute Lymphocytes: 2504 {cells}/uL (ref 850–3900)
Absolute Monocytes: 402 {cells}/uL (ref 200–950)
Basophils Absolute: 51 {cells}/uL (ref 0–200)
Basophils Relative: 0.7 %
Eosinophils Absolute: 219 {cells}/uL (ref 15–500)
Eosinophils Relative: 3 %
HCT: 40.1 % (ref 35.0–45.0)
Hemoglobin: 13.6 g/dL (ref 11.7–15.5)
MCH: 29.2 pg (ref 27.0–33.0)
MCHC: 33.9 g/dL (ref 32.0–36.0)
MCV: 86.2 fL (ref 80.0–100.0)
MPV: 11 fL (ref 7.5–12.5)
Monocytes Relative: 5.5 %
Neutro Abs: 4125 {cells}/uL (ref 1500–7800)
Neutrophils Relative %: 56.5 %
Platelets: 226 10*3/uL (ref 140–400)
RBC: 4.65 10*6/uL (ref 3.80–5.10)
RDW: 12 % (ref 11.0–15.0)
Total Lymphocyte: 34.3 %
WBC: 7.3 10*3/uL (ref 3.8–10.8)

## 2024-01-02 LAB — LIPID PANEL
Cholesterol: 217 mg/dL — ABNORMAL HIGH (ref <200)
HDL: 66 mg/dL (ref >=50)
LDL Cholesterol (Calc): 126 mg/dL — ABNORMAL HIGH
Non-HDL Cholesterol (Calc): 151 mg/dL — ABNORMAL HIGH (ref <130)
Total CHOL/HDL Ratio: 3.3 (calc) (ref <5.0)
Triglycerides: 131 mg/dL (ref <150)

## 2024-01-07 ENCOUNTER — Encounter: Payer: Self-pay | Admitting: Family Medicine

## 2024-01-07 ENCOUNTER — Ambulatory Visit (INDEPENDENT_AMBULATORY_CARE_PROVIDER_SITE_OTHER): Payer: BC Managed Care – PPO | Admitting: Family Medicine

## 2024-01-07 VITALS — BP 120/82 | HR 72 | Temp 97.7°F | Ht 61.0 in | Wt 144.0 lb

## 2024-01-07 DIAGNOSIS — E782 Mixed hyperlipidemia: Secondary | ICD-10-CM | POA: Diagnosis not present

## 2024-01-07 DIAGNOSIS — Z0001 Encounter for general adult medical examination with abnormal findings: Secondary | ICD-10-CM | POA: Diagnosis not present

## 2024-01-07 DIAGNOSIS — R5383 Other fatigue: Secondary | ICD-10-CM | POA: Insufficient documentation

## 2024-01-07 DIAGNOSIS — Z0000 Encounter for general adult medical examination without abnormal findings: Secondary | ICD-10-CM | POA: Insufficient documentation

## 2024-01-07 MED ORDER — AMPHETAMINE-DEXTROAMPHET ER 25 MG PO CP24: 25.0000 mg | ORAL_CAPSULE | ORAL | 0 refills | Status: DC

## 2024-01-07 NOTE — Assessment & Plan Note (Signed)
 Your labs showed elevated cholesterol. I recommend consuming a heart healthy diet such as Mediterranean diet or DASH diet with whole grains, fruits, vegetable, fish, lean meats, nuts, and olive oil. Limit sweets and processed foods. I also encourage moderate intensity exercise 150 minutes weekly. This is 3-5 times weekly for 30-50 minutes each session. Goal should be pace of 3 miles/hours, or walking 1.5 miles in 30 minutes. The 10-year ASCVD risk score (Arnett DK, et al., 2019) is: 0.8%

## 2024-01-07 NOTE — Progress Notes (Addendum)
 Complete physical exam  Patient: Sandra Parker   DOB: [birth year]   48 y.o. Female  MRN: [id number]  Subjective:    Chief Complaint  Patient presents with   Annual Exam    Sandra Parker is a 48 y.o. female who presents today for a complete physical exam. She reports consuming a general diet. Home exercise routine includes walking .5 hrs per day. She generally feels well. She reports sleeping well. She does not have additional problems to discuss today.   The 10-year ASCVD risk score (Arnett DK, et al., 2019) is: 0.8%   Values used to calculate the score:     Age: 57 years     Sex: Female     Is Non-Hispanic African American: No     Diabetic: No     Tobacco smoker: No     Systolic Blood Pressure: 120 mmHg     Is BP treated: No     HDL Cholesterol: 66 mg/dL     Total Cholesterol: 217 mg/dL   Most recent fall risk assessment:    04/16/2023    2:51 PM  Fall Risk   Falls in the past year? 0  Number falls in past yr: 0  Injury with Fall? 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 10/21/2023   Acute cystitis without hematuria 08/17/2023   Loss of hair 08/11/2023   Autosomal recessive colorectal adenomatous polyposis associated with mutation in MUTYH gene 08/07/2023   Attention deficit hyperactivity disorder (ADHD) 04/16/2023   Encounter to establish care with new doctor 04/16/2023   Moderate mixed hyperlipidemia not requiring statin therapy 04/16/2023   Genetic testing 06/10/2022   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   History of colonic polyps 05/23/2022   Anal fissure  03/05/2022   Pain of left shoulder joint on movement 02/07/2022   Elevated liver enzymes 01/20/2022   Gastroesophageal reflux disease without esophagitis 12/13/2021   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   History of hysterectomy 10/22/2016   Acne vulgaris 08/28/2015   Insomnia 04/24/2015   Large breasts 09/12/2014   Mid back pain 09/12/2014   Tendinitis of forearm 08/28/2014   Surgical menopause on hormone replacement therapy 08/28/2014   Migraine headache 06/17/2014   Rotator cuff syndrome 04/28/2014   Overweight (BMI 25.0-29.9) 07/20/2013   Pain in joint, shoulder region 07/20/2013   Vertigo 06/19/2013   Anxiety    Depressive disorder    Past Medical History:  Diagnosis Date   Anxiety    Depression    GERD (gastroesophageal reflux disease)    Macromastia 10/2018   Personal history of colonic polyps 05/23/2022   TMJ (temporomandibular joint syndrome)    Upper respiratory tract infection 11/29/2013   Past Surgical History:  Procedure Laterality Date   APPENDECTOMY     BIOPSY  10/31/2022   Procedure: BIOPSY;  Surgeon: Eartha Angelia Sieving, MD;  Location: [facility];  Service: Gastroenterology;;   BREAST REDUCTION SURGERY Bilateral 11/22/2018   Procedure: BREAST REDUCTION WITH  LIPOSUCTION;  Surgeon: Marcus Lung, MD;  Location: [facility];  Service: Plastics;  Laterality: Bilateral;  Bilateral    COLONOSCOPY WITH PROPOFOL  N/A 05/21/2022   Procedure: COLONOSCOPY WITH PROPOFOL ;  Surgeon: Eartha Angelia Sieving, MD;  Location: [facility];  Service: Gastroenterology;  Laterality: N/A;  830   COLONOSCOPY WITH PROPOFOL  N/A 08/07/2023   Procedure: COLONOSCOPY WITH PROPOFOL ;  Surgeon: Eartha Angelia Sieving, MD;  Location: [facility];  Service: Gastroenterology;  Laterality: N/A;  8:00am;asa any   ESOPHAGOGASTRODUODENOSCOPY (EGD) WITH PROPOFOL  N/A 10/31/2022   Procedure: ESOPHAGOGASTRODUODENOSCOPY (EGD) WITH PROPOFOL ;  Surgeon: Eartha Angelia Sieving, MD;  Location: [facility];  Service: Gastroenterology;  Laterality: N/A;  1230 ASA 2   HYSTEROSCOPY WITH D & C  08/02/2001   LAPAROSCOPIC CHOLECYSTECTOMY  08/16/2010   POLYPECTOMY  05/21/2022   Procedure: POLYPECTOMY INTESTINAL;  Surgeon: Eartha Angelia Sieving, MD;  Location: [facility];  Service: Gastroenterology;;   POLYPECTOMY  10/31/2022   Procedure: POLYPECTOMY;  Surgeon: Eartha Angelia Sieving, MD;  Location: [facility];  Service: Gastroenterology;;   POLYPECTOMY  08/07/2023   Procedure: POLYPECTOMY INTESTINAL;  Surgeon: Eartha Angelia, Sieving, MD;  Location: [facility];  Service: Gastroenterology;;   REDUCTION MAMMAPLASTY Bilateral    bilateral breast reduction nov 2019   TOTAL ABDOMINAL HYSTERECTOMY W/ BILATERAL SALPINGOOPHORECTOMY Left 04/16/2011   Social History   Tobacco Use   Smoking status: Never   Smokeless tobacco: Never  Vaping Use   Vaping status: Never Used  Substance Use Topics   Alcohol use: Not Currently   Drug use: No   Family History  Problem Relation Age of Onset   Healthy Mother    Healthy Father    Leukemia Maternal Uncle        d. > 50   Colon cancer Neg Hx    Allergies  Allergen Reactions   Prozac  [Fluoxetine ] Other (See Comments)    HEADACHES   Wellbutrin [Bupropion] Other (See Comments)    NIGHTMARES   Cephalexin Rash   Ciprofloxacin Rash   Relafen [Nabumetone] Other (See Comments)    UNKNOWN      Patient Care Team: Kayla Jeoffrey RAMAN, FNP as PCP - General (Family Medicine)   Outpatient Medications Prior to Visit  Medication Sig   ALPRAZolam  (XANAX ) 0.5 MG tablet Take 1 tablet (0.5 mg total) by mouth 2 (two) times daily as needed for anxiety.   cycloSPORINE (RESTASIS) 0.05 % ophthalmic emulsion Place 1 drop into both eyes 2 (two) times daily.   DOTTI  0.05 MG/24HR patch Place 1 patch onto the skin 2 (two) times a week.   fluticasone  (FLONASE ) 50 MCG/ACT nasal spray Place 1 spray into both nostrils daily as  needed for allergies.   meclizine  (ANTIVERT ) 12.5 MG tablet Take 1 tablet (12.5 mg total) by mouth 3 (three) times daily as needed for dizziness.   melatonin 3 MG TABS tablet Take 1 tablet (3 mg total) by mouth at bedtime.   Multiple Vitamins-Calcium (ONE-A-DAY WOMENS FORMULA PO) Take 1 drop by mouth daily.   NEOMYCIN -POLYMYXIN-HYDROCORTISONE (CORTISPORIN) 1 % SOLN OTIC solution Place 3 drops into both ears 4 (four) times daily.   pantoprazole  (PROTONIX ) 40 MG tablet Take 1 tablet (40 mg total) by mouth 2 (two) times daily.   Rimegepant Sulfate 75 MG TBDP Take 75 mg by mouth daily as needed (Migraines).   tretinoin  (RETIN-A ) 0.025 % gel Apply topically at bedtime.   VAGIFEM  10 MCG TABS vaginal tablet Place 1 tablet vaginally  at bedtime.   [DISCONTINUED] amphetamine -dextroamphetamine (ADDERALL XR) 25 MG 24 hr capsule Take 1 capsule by mouth every morning.   acyclovir  (ZOVIRAX ) 400 MG tablet Take 1 tablet (400 mg total) by mouth 2 (two) times daily. (Patient not taking: Reported on 12/28/2023)   No facility-administered medications prior to visit.    Review of Systems  Constitutional: Negative.   HENT:  Positive for congestion.   Eyes: Negative.   Respiratory: Negative.    Cardiovascular: Negative.   Gastrointestinal: Negative.   Genitourinary: Negative.   Musculoskeletal: Negative.   Skin: Negative.   Neurological: Negative.   Endo/Heme/Allergies: Negative.   Psychiatric/Behavioral: Negative.    All other systems reviewed and are negative.         Objective:     BP 120/82   Pulse 72   Temp 97.7 F (36.5 C) (Oral)   Ht 5' 1 (1.549 m)   Wt 144 lb (65.3 kg)   SpO2 92%   BMI 27.21 kg/m  BP Readings from Last 3 Encounters:  01/07/24 120/82  12/28/23 120/82  10/21/23 130/86   Wt Readings from Last 3 Encounters:  01/07/24 144 lb (65.3 kg)  12/28/23 147 lb (66.7 kg)  10/21/23 140 lb (63.5 kg)      Physical Exam Vitals and nursing note reviewed.  Constitutional:       Appearance: Normal appearance. She is normal weight.  HENT:     Head: Normocephalic and atraumatic.     Right Ear: Tympanic membrane, ear canal and external ear normal.     Left Ear: Tympanic membrane, ear canal and external ear normal.     Nose: Nose normal.     Mouth/Throat:     Mouth: Mucous membranes are moist.     Pharynx: Oropharynx is clear.  Eyes:     Extraocular Movements: Extraocular movements intact.     Conjunctiva/sclera: Conjunctivae normal.     Pupils: Pupils are equal, round, and reactive to light.  Cardiovascular:     Rate and Rhythm: Normal rate and regular rhythm.     Pulses: Normal pulses.     Heart sounds: Normal heart sounds.  Pulmonary:     Effort: Pulmonary effort is normal.     Breath sounds: Normal breath sounds.  Abdominal:     General: Bowel sounds are normal.     Palpations: Abdomen is soft.  Musculoskeletal:        General: Normal range of motion.     Cervical back: Normal range of motion and neck supple.  Skin:    General: Skin is warm and dry.     Capillary Refill: Capillary refill takes less than 2 seconds.  Neurological:     General: No focal deficit present.     Mental Status: She is alert and oriented to person, place, and time. Mental status is at baseline.  Psychiatric:        Mood and Affect: Mood normal.        Behavior: Behavior normal.        Thought Content: Thought content normal.        Judgment: Judgment normal.      No results found for any visits on 01/07/24. Last CBC Lab Results  Component Value Date   WBC 7.3 01/01/2024   HGB 13.6 01/01/2024   HCT 40.1 01/01/2024   MCV 86.2 01/01/2024   MCH 29.2 01/01/2024   RDW 12.0 01/01/2024   PLT 226 01/01/2024   Last metabolic panel Lab Results  Component  Value Date   GLUCOSE 96 01/01/2024   NA 139 01/01/2024   K 4.4 01/01/2024   CL 104 01/01/2024   CO2 26 01/01/2024   BUN 13 01/01/2024   CREATININE 0.85 01/01/2024   EGFR 84 01/01/2024   CALCIUM 9.5 01/01/2024    PROT 6.9 01/01/2024   ALBUMIN 4.4 06/02/2016   BILITOT 0.6 01/01/2024   ALKPHOS 69 06/02/2016   AST 20 01/01/2024   ALT 24 01/01/2024   Last lipids Lab Results  Component Value Date   CHOL 217 (H) 01/01/2024   HDL 66 01/01/2024   LDLCALC 126 (H) 01/01/2024   TRIG 131 01/01/2024   CHOLHDL 3.3 01/01/2024   Last hemoglobin A1c Lab Results  Component Value Date   HGBA1C 5.6 01/17/2022   Last thyroid functions Lab Results  Component Value Date   TSH 0.67 01/17/2022   Last vitamin D  Lab Results  Component Value Date   VD25OH 40 01/17/2022   Last vitamin B12 and Folate No results found for: VITAMINB12, FOLATE      Assessment & Plan:    Routine Health Maintenance and Physical Exam  Immunization History  Administered Date(s) Administered   PFIZER(Purple Top)SARS-COV-2 Vaccination 08/24/2020, 09/14/2020   Tdap 12/03/2020   Unspecified SARS-COV-2 Vaccination 07/29/2020, 08/29/2020    Health Maintenance  Topic Date Due   COVID-19 Vaccine (5 - 2024-25 season) 01/23/2024 (Originally 08/30/2023)   INFLUENZA VACCINE  03/28/2024 (Originally 07/30/2023)   Colonoscopy  08/06/2025   DTaP/Tdap/Td (2 - Td or Tdap) 12/03/2030   Hepatitis C Screening  Completed   HIV Screening  Completed   HPV VACCINES  Aged Out    Discussed health benefits of physical activity, and encouraged her to engage in regular exercise appropriate for her age and condition.  Problem List Items Addressed This Visit     Moderate mixed hyperlipidemia not requiring statin therapy   Your labs showed elevated cholesterol. I recommend consuming a heart healthy diet such as Mediterranean diet or DASH diet with whole grains, fruits, vegetable, fish, lean meats, nuts, and olive oil. Limit sweets and processed foods. I also encourage moderate intensity exercise 150 minutes weekly. This is 3-5 times weekly for 30-50 minutes each session. Goal should be pace of 3 miles/hours, or walking 1.5 miles in 30  minutes. The 10-year ASCVD risk score (Arnett DK, et al., 2019) is: 0.8%       Physical exam, annual - Primary   Today your medical history was reviewed and routine physical exam with labs was performed. Recommend 150 minutes of moderate intensity exercise weekly and consuming a well-balanced diet. Advised to stop smoking if a smoker, avoid smoking if a non-smoker, limit alcohol consumption to 1 drink per day for women and 2 drinks per day for men, and avoid illicit drug use. Counseled on safe sex practices and offered STI testing today. Counseled on the importance of sunscreen use. Counseled in mental health awareness and when to seek medical care. Vaccine maintenance discussed. Appropriate health maintenance items reviewed. Return to office in 1 year for annual physical exam.       Return in about 6 months (around 07/06/2024) for follow-up.     Jeoffrey GORMAN Barrio, FNP

## 2024-01-07 NOTE — Assessment & Plan Note (Signed)

## 2024-01-13 ENCOUNTER — Encounter: Payer: Self-pay | Admitting: Family Medicine

## 2024-01-13 ENCOUNTER — Telehealth: Payer: Self-pay

## 2024-01-13 NOTE — Telephone Encounter (Signed)
 Copied from CRM [id number]. Topic: Clinical - Medical Advice >> Jan 13, 2024  8:18 AM Baldomero Bone wrote: Reason for CRM: Patient wants a prescription called in for a sinus infection that is not getting better. Callback number is [phone]

## 2024-01-14 ENCOUNTER — Telehealth: Payer: 59 | Admitting: Family Medicine

## 2024-01-14 ENCOUNTER — Encounter: Payer: Self-pay | Admitting: Family Medicine

## 2024-01-14 ENCOUNTER — Telehealth: Payer: 59 | Admitting: Physician Assistant

## 2024-01-14 DIAGNOSIS — J019 Acute sinusitis, unspecified: Secondary | ICD-10-CM

## 2024-01-14 DIAGNOSIS — B9789 Other viral agents as the cause of diseases classified elsewhere: Secondary | ICD-10-CM

## 2024-01-14 MED ORDER — BENZONATATE 100 MG PO CAPS: 100.0000 mg | ORAL_CAPSULE | Freq: Three times a day (TID) | ORAL | 0 refills | Status: DC | PRN

## 2024-01-14 MED ORDER — HYDROCODONE BIT-HOMATROP MBR 5-1.5 MG/5ML PO SOLN: 5.0000 mL | Freq: Three times a day (TID) | ORAL | 0 refills | Status: DC | PRN

## 2024-01-14 MED ORDER — IPRATROPIUM BROMIDE 0.03 % NA SOLN
2.0000 | Freq: Two times a day (BID) | NASAL | 0 refills | Status: DC
Start: 2024-01-14 — End: 2024-06-16

## 2024-01-14 NOTE — Progress Notes (Signed)
I have spent 5 minutes in review of e-visit questionnaire, review and updating patient chart, medical decision making and response to patient.   Mia Milan Cody Jacklynn Dehaas, PA-C    

## 2024-01-14 NOTE — Assessment & Plan Note (Signed)
Reassured patient that symptoms and exam findings are most consistent with a viral upper respiratory infection and explained lack of efficacy of antibiotics against viruses.  Discussed expected course and features suggestive of secondary bacterial infection.  Continue supportive care. Increase fluid intake with water or electrolyte solution like pedialyte. Encouraged acetaminophen as needed for fever/pain. Encouraged salt water gargling, chloraseptic spray and throat lozenges. Encouraged OTC guaifenesin. Encouraged saline sinus flushes and/or neti with humidified air. Start Hycodan PRN for cough that is interfering with day to day activities.

## 2024-01-14 NOTE — Progress Notes (Signed)
E-Visit for Sinus Problems  We are sorry that you are not feeling well.  Here is how we plan to help!  Based on what you have shared with me it looks like you have sinusitis.  Sinusitis is inflammation and infection in the sinus cavities of the head.  Based on your presentation I believe you most likely have Acute Viral Sinusitis.This is an infection most likely caused by a virus. There is not specific treatment for viral sinusitis other than to help you with the symptoms until the infection runs its course.  You may use an oral decongestant such as Mucinex D or if you have glaucoma or high blood pressure use plain Mucinex. Saline nasal spray help and can safely be used as often as needed for congestion, I have prescribed: Ipratropium Bromide nasal spray 0.03% 2 sprays in eah nostril 2-3 times a day  I have also prescribed a cough medication for you to use as directed.  Some authorities believe that zinc sprays or the use of Echinacea may shorten the course of your symptoms.  Sinus infections are not as easily transmitted as other respiratory infection, however we still recommend that you avoid close contact with loved ones, especially the very young and elderly.  Remember to wash your hands thoroughly throughout the day as this is the number one way to prevent the spread of infection!  Home Care: Only take medications as instructed by your medical team. Do not take these medications with alcohol. A steam or ultrasonic humidifier can help congestion.  You can place a towel over your head and breathe in the steam from hot water coming from a faucet. Avoid close contacts especially the very young and the elderly. Cover your mouth when you cough or sneeze. Always remember to wash your hands.  Get Help Right Away If: You develop worsening fever or sinus pain. You develop a severe head ache or visual changes. Your symptoms persist after you have completed your treatment plan.  Make sure  you Understand these instructions. Will watch your condition. Will get help right away if you are not doing well or get worse.   Thank you for choosing an e-visit.  Your e-visit answers were reviewed by a board certified advanced clinical practitioner to complete your personal care plan. Depending upon the condition, your plan could have included both over the counter or prescription medications.  Please review your pharmacy choice. Make sure the pharmacy is open so you can pick up prescription now. If there is a problem, you may contact your provider through [employer] and have the prescription routed to another pharmacy.  Your safety is important to [place]. If you have drug allergies check your prescription carefully.   For the next 24 hours you can use MyChart to ask questions about today's visit, request a non-urgent call back, or ask for a work or school excuse. You will get an email in the next two days asking about your experience. I hope that your e-visit has been valuable and will speed your recovery.

## 2024-01-14 NOTE — Progress Notes (Signed)
Virtual Visit via Video note  I connected with Sandra Parker on 01/14/24 at 0849 by video and verified that I am speaking with the correct person using two identifiers. Sandra Parker is currently located at home and no one is currently with her during visit. The provider, Park Meo, FNP is located in their office at time of visit.  I discussed the limitations, risks, security and privacy concerns of performing an evaluation and management service by video and the availability of in person appointments. I also discussed with the patient that there may be a patient responsible charge related to this service. The patient expressed understanding and agreed to proceed.  Subjective: PCP: Park Meo, FNP  No chief complaint on file.   5 days of sinus pressure and congestion with headache, clear rhinorrhea, dry cough. Denies fever, chills, body aches, SOB, wheezing, pleurisy, dental pain, or loss of taste or smell.  Has tried dayquil, nyquil, mucinex, flonase. Symptoms are stable. Home covid was negative.     ROS: Per HPI  Current Outpatient Medications:    HYDROcodone bit-homatropine (HYCODAN) 5-1.5 MG/5ML syrup, Take 5 mLs by mouth every 8 (eight) hours as needed for cough., Disp: 120 mL, Rfl: 0   acyclovir (ZOVIRAX) 400 MG tablet, Take 1 tablet (400 mg total) by mouth 2 (two) times daily. (Patient not taking: Reported on 12/28/2023), Disp: 180 tablet, Rfl: 2   ALPRAZolam (XANAX) 0.5 MG tablet, Take 1 tablet (0.5 mg total) by mouth 2 (two) times daily as needed for anxiety., Disp: 30 tablet, Rfl: 0   amphetamine-dextroamphetamine (ADDERALL XR) 25 MG 24 hr capsule, Take 1 capsule by mouth every morning., Disp: 30 capsule, Rfl: 0   benzonatate (TESSALON) 100 MG capsule, Take 1 capsule (100 mg total) by mouth 3 (three) times daily as needed for cough., Disp: 30 capsule, Rfl: 0   cycloSPORINE (RESTASIS) 0.05 % ophthalmic emulsion, Place 1 drop into both eyes 2 (two) times daily., Disp: ,  Rfl:    DOTTI 0.05 MG/24HR patch, Place 1 patch onto the skin 2 (two) times a week., Disp: , Rfl:    fluticasone (FLONASE) 50 MCG/ACT nasal spray, Place 1 spray into both nostrils daily as needed for allergies., Disp: , Rfl:    ipratropium (ATROVENT) 0.03 % nasal spray, Place 2 sprays into both nostrils every 12 (twelve) hours., Disp: 30 mL, Rfl: 0   meclizine (ANTIVERT) 12.5 MG tablet, Take 1 tablet (12.5 mg total) by mouth 3 (three) times daily as needed for dizziness., Disp: 20 tablet, Rfl: 0   melatonin 3 MG TABS tablet, Take 1 tablet (3 mg total) by mouth at bedtime., Disp: 90 tablet, Rfl: 0   Multiple Vitamins-Calcium (ONE-A-DAY WOMENS FORMULA PO), Take 1 drop by mouth daily., Disp: , Rfl:    NEOMYCIN-POLYMYXIN-HYDROCORTISONE (CORTISPORIN) 1 % SOLN OTIC solution, Place 3 drops into both ears 4 (four) times daily., Disp: 10 mL, Rfl: 0   pantoprazole (PROTONIX) 40 MG tablet, Take 1 tablet (40 mg total) by mouth 2 (two) times daily., Disp: 60 tablet, Rfl: 0   Rimegepant Sulfate 75 MG TBDP, Take 75 mg by mouth daily as needed (Migraines)., Disp: , Rfl:    tretinoin (RETIN-A) 0.025 % gel, Apply topically at bedtime., Disp: 45 g, Rfl: 0   VAGIFEM 10 MCG TABS vaginal tablet, Place 1 tablet vaginally at bedtime., Disp: , Rfl:   Observations/Objective: Physical Exam Constitutional:      General: She is not in acute distress.  Appearance: Normal appearance. She is not toxic-appearing.  HENT:     Nose: Congestion present.     Mouth/Throat:     Comments: hoarseness Eyes:     Conjunctiva/sclera: Conjunctivae normal.  Pulmonary:     Effort: Pulmonary effort is normal. No respiratory distress.  Skin:    Coloration: Skin is not pale.  Neurological:     General: No focal deficit present.     Mental Status: She is alert and oriented to person, place, and time.  Psychiatric:        Mood and Affect: Mood normal.        Behavior: Behavior normal.        Thought Content: Thought content normal.         Judgment: Judgment normal.    Assessment and Plan: Acute viral sinusitis Assessment & Plan: Reassured patient that symptoms and exam findings are most consistent with a viral upper respiratory infection and explained lack of efficacy of antibiotics against viruses.  Discussed expected course and features suggestive of secondary bacterial infection.  Continue supportive care. Increase fluid intake with water or electrolyte solution like pedialyte. Encouraged acetaminophen as needed for fever/pain. Encouraged salt water gargling, chloraseptic spray and throat lozenges. Encouraged OTC guaifenesin. Encouraged saline sinus flushes and/or neti with humidified air. Start Hycodan PRN for cough that is interfering with day to day activities.    Other orders -     HYDROcodone Bit-Homatrop MBr; Take 5 mLs by mouth every 8 (eight) hours as needed for cough.  Dispense: 120 mL; Refill: 0    Follow Up Instructions: Return if symptoms worsen or fail to improve.   I discussed the assessment and treatment plan with the patient. The patient was provided an opportunity to ask questions and all were answered. The patient agreed with the plan and demonstrated an understanding of the instructions.   The patient was advised to call back or seek an in-person evaluation if the symptoms worsen or if the condition fails to improve as anticipated.  The above assessment and management plan was discussed with the patient. The patient verbalized understanding of and has agreed to the management plan. Patient is aware to call the clinic if symptoms persist or worsen. Patient is aware when to return to the clinic for a follow-up visit. Patient educated on when it is appropriate to go to the emergency department.   Time call ended: 0858  I provided 9 minutes of face-to-face time during this encounter.   Kurtis Bushman, MSN, APRN, FNP-C [facility]

## 2024-01-18 ENCOUNTER — Other Ambulatory Visit (HOSPITAL_COMMUNITY): Payer: Self-pay | Admitting: Family Medicine

## 2024-01-18 ENCOUNTER — Encounter (HOSPITAL_COMMUNITY): Payer: Self-pay

## 2024-01-18 ENCOUNTER — Ambulatory Visit (HOSPITAL_COMMUNITY)
Admission: RE | Admit: 2024-01-18 | Discharge: 2024-01-18 | Disposition: A | Discharge status: Home / Self Care | Payer: 59 | Source: Ambulatory Visit | Attending: Family Medicine | Admitting: Family Medicine

## 2024-01-18 DIAGNOSIS — Z1231 Encounter for screening mammogram for malignant neoplasm of breast: Secondary | ICD-10-CM | POA: Diagnosis present

## 2024-01-19 ENCOUNTER — Ambulatory Visit: Payer: Self-pay | Admitting: Family Medicine

## 2024-01-19 ENCOUNTER — Encounter: Payer: Self-pay | Admitting: Family Medicine

## 2024-01-19 ENCOUNTER — Other Ambulatory Visit: Payer: Self-pay | Admitting: Family Medicine

## 2024-01-19 MED ORDER — DOXYCYCLINE HYCLATE 100 MG PO TABS: 100.0000 mg | ORAL_TABLET | Freq: Two times a day (BID) | ORAL | 0 refills | Status: AC

## 2024-01-19 NOTE — Telephone Encounter (Signed)
Copied from CRM [id number]. Topic: Clinical - Medication Question >> Jan 19, 2024  9:17 AM Prudencio Pair wrote: Reason for CRM: Patient stated she sent a message via MyChart to nurse & has not gotten a response. She states she is still having sinus issues & thinks she needs an antibiotic. Please give patient a call back to advise as requested. CB #: [id number].   Chief Complaint: Sinus pressure Symptoms: Sinus pressure, coughing, blowing her nose a lot--was clear and now it is getting thick now starting to have a little blood in it Frequency: x 1 week Pertinent Negatives: Patient denies nausea, vomiting, diarrhea, ear ache, difficulty breathing Disposition: [] ED /[] Urgent Care (no appt availability in office) / [] Appointment(In office/virtual)/ []  [facility]/ [] Home Care/ [x] Refused Recommended Disposition /[] [facility]/ []  Follow-up with PCP Additional Notes: Patient called and advised that she had been assessed and treated by her PCP office for sinus issues.  She advised that Amber also called in some cough medicine. Patient states that she sent a MyChart message this morning but hadn't had a response back yet.  Patient states that she is still feeling bad and was wondering about possibly getting antibiotics at this point.  She denies any difficulty breathing, fevers, nausea, vomiting, diarrhea, ear aches.  Patient was advised to let her PCP know how she was doing to see if she was getting any better.  Patient states that she just is not getting any better at this point and wanted to hear back from her PCP on antibiotics or if she needed to make an office visit appointment now.  She is advised that if she gets worse she can go to the emergency room.  Patient verbalized understanding.  Reason for Disposition  Lots of coughing  Answer Assessment - Initial Assessment Questions 1. LOCATION: "Where does it hurt?"      Head, nose 2. ONSET: "When did the sinus pain start?"  (e.g.,  hours, days)      X 1 week 3. SEVERITY: "How bad is the pain?"   (Scale 1-10; mild, moderate or severe)   - MILD (1-3): doesn't interfere with normal activities    - MODERATE (4-7): interferes with normal activities (e.g., work or school) or awakens from sleep   - SEVERE (8-10): excruciating pain and patient unable to do any normal activities        7-8 4. RECURRENT SYMPTOM: "Have you ever had sinus problems before?" If Yes, ask: "When was the last time?" and "What happened that time?"      Recently but it is worse 5. NASAL CONGESTION: "Is the nose blocked?" If Yes, ask: "Can you open it or must you breathe through your mouth?"     yes 6. NASAL DISCHARGE: "Do you have discharge from your nose?" If so ask, "What color?"     Clear thick with a little blood 7. FEVER: "Do you have a fever?" If Yes, ask: "What is it, how was it measured, and when did it start?"      no 8. OTHER SYMPTOMS: "Do you have any other symptoms?" (e.g., sore throat, cough, earache, difficulty breathing)     Cough, 9. PREGNANCY: "Is there any chance you are pregnant?" "When was your last menstrual period?"     No  Protocols used: Sinus Pain or Congestion-A-AH

## 2024-01-22 MED ORDER — HYDROCODONE BIT-HOMATROP MBR 5-1.5 MG/5ML PO SOLN: 5.0000 mL | Freq: Three times a day (TID) | ORAL | 0 refills | Status: DC | PRN

## 2024-01-22 NOTE — Telephone Encounter (Signed)
Copied from CRM [id number]. Topic: Clinical - Prescription Issue >> Jan 22, 2024  9:57 AM [name] wrote: Reason for CRM: The patient's pharmacy does not have HYDROcodone bit-homatropine (HYCODAN) 5-1.5 MG/5ML syrup in stock and she is requesting it to be sent to [facility] at [address], [place], [place] [address] instead. She is requesting a call back at [phone].

## 2024-01-26 ENCOUNTER — Ambulatory Visit (HOSPITAL_COMMUNITY)
Admission: RE | Admit: 2024-01-26 | Discharge: 2024-01-26 | Disposition: A | Discharge status: Home / Self Care | Payer: 59 | Source: Ambulatory Visit | Attending: Family Medicine | Admitting: Family Medicine

## 2024-01-26 ENCOUNTER — Ambulatory Visit (INDEPENDENT_AMBULATORY_CARE_PROVIDER_SITE_OTHER): Payer: 59 | Admitting: Family Medicine

## 2024-01-26 ENCOUNTER — Encounter: Payer: Self-pay | Admitting: Family Medicine

## 2024-01-26 VITALS — BP 120/80 | HR 107 | Temp 98.1°F | Ht 61.0 in | Wt 146.0 lb

## 2024-01-26 DIAGNOSIS — J069 Acute upper respiratory infection, unspecified: Secondary | ICD-10-CM | POA: Diagnosis present

## 2024-01-26 MED ORDER — PREDNISONE 20 MG PO TABS
ORAL_TABLET | ORAL | 0 refills | Status: DC
Start: 2024-01-26 — End: 2024-06-16

## 2024-01-26 MED ORDER — ALBUTEROL SULFATE HFA 108 (90 BASE) MCG/ACT IN AERS: 2.0000 | INHALATION_SPRAY | Freq: Four times a day (QID) | RESPIRATORY_TRACT | 0 refills | Status: DC | PRN

## 2024-01-26 NOTE — Assessment & Plan Note (Addendum)
Will obtain CXR to r/o pneumonia in setting of SOB and fatigue with prolonged symptoms. Albuterol PRN for SOB and wheezing. Start prednisone taper. Covid, flu, RSV test done today. Continue OTC symptomatic management. Return to office if symptoms persist or worsen.

## 2024-01-26 NOTE — Progress Notes (Signed)
Subjective:  HPI: Sandra Parker is a 48 y.o. female presenting on 01/26/2024 for Acute Visit (Cold/flu symptoms)   HPI Patient is in today for 3 weeks of cough, shortness of breath, chest congestion, rhinorrhea, improving sinus pressure, chills, and fatigue. She was treated 2 weeks ago with Doxycycline for sinusitis and her symptoms improved until 1 week ago she became ill again. She reports coming across many sick people at work who are coughing. Denies fever, body aches, wheezing. Has tried Doxycycline, hycodan, mucinex   Review of Systems  All other systems reviewed and are negative.   Relevant past medical history reviewed and updated as indicated.   Past Medical History:  Diagnosis Date   Anxiety    Depression    GERD (gastroesophageal reflux disease)    Macromastia 10/2018   Personal history of colonic polyps 05/23/2022   TMJ (temporomandibular joint syndrome)    Upper respiratory tract infection 11/29/2013     Past Surgical History:  Procedure Laterality Date   APPENDECTOMY     BIOPSY  10/31/2022   Procedure: BIOPSY;  Surgeon: Dolores Frame, MD;  [facility]: [facility];  Service: Gastroenterology;;   BREAST REDUCTION SURGERY Bilateral 11/22/2018   Procedure: BREAST REDUCTION WITH LIPOSUCTION;  Surgeon: Louisa Second, MD;  Location: [facility];  Service: Plastics;  Laterality: Bilateral;  Bilateral    COLONOSCOPY WITH PROPOFOL N/A 05/21/2022   Procedure: COLONOSCOPY WITH PROPOFOL;  Surgeon: Dolores Frame, MD;  Location: [facility];  Service: Gastroenterology;  Laterality: N/A;  830   COLONOSCOPY WITH PROPOFOL N/A 08/07/2023   Procedure: COLONOSCOPY WITH PROPOFOL;  Surgeon: Dolores Frame, MD;  Location: [facility];  Service: Gastroenterology;  Laterality: N/A;  8:00am;asa any   ESOPHAGOGASTRODUODENOSCOPY (EGD) WITH PROPOFOL N/A 10/31/2022   Procedure: ESOPHAGOGASTRODUODENOSCOPY (EGD) WITH PROPOFOL;  Surgeon:  Dolores Frame, MD;  Location: [facility];  Service: Gastroenterology;  Laterality: N/A;  1230 ASA 2   HYSTEROSCOPY WITH D & C  08/02/2001   LAPAROSCOPIC CHOLECYSTECTOMY  08/16/2010   POLYPECTOMY  05/21/2022   Procedure: POLYPECTOMY INTESTINAL;  Surgeon: Dolores Frame, MD;  [facility]: [facility];  Service: Gastroenterology;;   POLYPECTOMY  10/31/2022   Procedure: POLYPECTOMY;  Surgeon: Dolores Frame, MD;  [facility]: [facility];  Service: Gastroenterology;;   POLYPECTOMY  08/07/2023   Procedure: POLYPECTOMY INTESTINAL;  Surgeon: Marguerita Merles, Reuel Boom, MD;  Location: [facility];  Service: Gastroenterology;;   REDUCTION MAMMAPLASTY Bilateral    bilateral breast reduction nov 2019   TOTAL ABDOMINAL HYSTERECTOMY W/ BILATERAL SALPINGOOPHORECTOMY Left 04/16/2011    Allergies and medications reviewed and updated.   Current Outpatient Medications:    albuterol (VENTOLIN HFA) 108 (90 Base) MCG/ACT inhaler, Inhale 2 puffs into the lungs every 6 (six) hours as needed for wheezing or shortness of breath., Disp: 8 g, Rfl: 0   ALPRAZolam (XANAX) 0.5 MG tablet, Take 1 tablet (0.5 mg total) by mouth 2 (two) times daily as needed for anxiety., Disp: 30 tablet, Rfl: 0   amphetamine-dextroamphetamine (ADDERALL XR) 25 MG 24 hr capsule, Take 1 capsule by mouth every morning., Disp: 30 capsule, Rfl: 0   benzonatate (TESSALON) 100 MG capsule, Take 1 capsule (100 mg total) by mouth 3 (three) times daily as needed for cough., Disp: 30 capsule, Rfl: 0   cycloSPORINE (RESTASIS) 0.05 % ophthalmic emulsion, Place 1 drop into both eyes 2 (two) times daily., Disp: , Rfl:    DOTTI 0.05 MG/24HR patch, Place  1 patch onto the skin 2 (two) times a week., Disp: , Rfl:    doxycycline (VIBRA-TABS) 100 MG tablet, Take 1 tablet (100 mg total) by mouth 2 (two) times daily for 7 days., Disp: 14 tablet, Rfl: 0   fluticasone (FLONASE) 50 MCG/ACT nasal spray, Place 1 spray into both nostrils  daily as needed for allergies., Disp: , Rfl:    HYDROcodone bit-homatropine (HYCODAN) 5-1.5 MG/5ML syrup, Take 5 mLs by mouth every 8 (eight) hours as needed for cough., Disp: 120 mL, Rfl: 0   ipratropium (ATROVENT) 0.03 % nasal spray, Place 2 sprays into both nostrils every 12 (twelve) hours., Disp: 30 mL, Rfl: 0   meclizine (ANTIVERT) 12.5 MG tablet, Take 1 tablet (12.5 mg total) by mouth 3 (three) times daily as needed for dizziness., Disp: 20 tablet, Rfl: 0   melatonin 3 MG TABS tablet, Take 1 tablet (3 mg total) by mouth at bedtime., Disp: 90 tablet, Rfl: 0   Multiple Vitamins-Calcium (ONE-A-DAY WOMENS FORMULA PO), Take 1 drop by mouth daily., Disp: , Rfl:    NEOMYCIN-POLYMYXIN-HYDROCORTISONE (CORTISPORIN) 1 % SOLN OTIC solution, Place 3 drops into both ears 4 (four) times daily., Disp: 10 mL, Rfl: 0   pantoprazole (PROTONIX) 40 MG tablet, Take 1 tablet (40 mg total) by mouth 2 (two) times daily., Disp: 60 tablet, Rfl: 0   predniSONE (DELTASONE) 20 MG tablet, 3 tabs poqday 1-2, 2 tabs poqday 3-4, 1 tab poqday 5-6, Disp: 12 tablet, Rfl: 0   Rimegepant Sulfate 75 MG TBDP, Take 75 mg by mouth daily as needed (Migraines)., Disp: , Rfl:    tretinoin (RETIN-A) 0.025 % gel, Apply topically at bedtime., Disp: 45 g, Rfl: 0   VAGIFEM 10 MCG TABS vaginal tablet, Place 1 tablet vaginally at bedtime., Disp: , Rfl:    acyclovir (ZOVIRAX) 400 MG tablet, Take 1 tablet (400 mg total) by mouth 2 (two) times daily. (Patient not taking: Reported on 12/28/2023), Disp: 180 tablet, Rfl: 2  Allergies  Allergen Reactions   Prozac [Fluoxetine] Other (See Comments)    HEADACHES   Wellbutrin [Bupropion] Other (See Comments)    NIGHTMARES   Cephalexin Rash   Ciprofloxacin Rash   Relafen [Nabumetone] Other (See Comments)    UNKNOWN    Objective:   BP 120/80   Pulse (!) 107   Temp 98.1 F (36.7 C) (Oral)   Ht 5\' 1"  (1.549 m)   Wt 146 lb (66.2 kg)   SpO2 100%   BMI 27.59 kg/m      01/26/2024    3:24 PM  01/07/2024    2:28 PM 12/28/2023    9:58 AM  Vitals with BMI  Height 5\' 1"  5\' 1"    Weight 146 lbs 144 lbs   BMI 27.6 27.22   Systolic 120 120 284  Diastolic 80 82 82  Pulse 107 72      Physical Exam Vitals and nursing note reviewed.  Constitutional:      Appearance: Normal appearance. She is normal weight.  HENT:     Head: Normocephalic and atraumatic.     Right Ear: Tympanic membrane, ear canal and external ear normal.     Left Ear: Tympanic membrane, ear canal and external ear normal.     Nose: Congestion present.     Right Sinus: No maxillary sinus tenderness or frontal sinus tenderness.     Left Sinus: No maxillary sinus tenderness or frontal sinus tenderness.     Mouth/Throat:     Mouth: Mucous membranes  are moist.     Pharynx: Oropharynx is clear.  Eyes:     Extraocular Movements: Extraocular movements intact.     Conjunctiva/sclera: Conjunctivae normal.  Cardiovascular:     Rate and Rhythm: Regular rhythm. Tachycardia present.     Pulses: Normal pulses.     Heart sounds: Normal heart sounds.  Pulmonary:     Effort: Pulmonary effort is normal.     Breath sounds: Normal breath sounds.  Musculoskeletal:     Cervical back: No tenderness.  Lymphadenopathy:     Cervical: No cervical adenopathy.  Skin:    General: Skin is warm and dry.  Neurological:     General: No focal deficit present.     Mental Status: She is alert and oriented to person, place, and time. Mental status is at baseline.  Psychiatric:        Mood and Affect: Mood normal.        Behavior: Behavior normal.        Thought Content: Thought content normal.        Judgment: Judgment normal.     Assessment & Plan:  URI with cough and congestion Assessment & Plan: Will obtain CXR to r/o pneumonia in setting of SOB and fatigue with prolonged symptoms. Albuterol PRN for SOB and wheezing. Start prednisone taper. Covid, flu, RSV test done today. Continue OTC symptomatic management. Return to office if  symptoms persist or worsen.  Orders: -     DG Chest 2 View; Future -     SARS-CoV-2 RNA, Influenza A/B, and RSV RNA, Qualitative NAAT  Other orders -     Albuterol Sulfate HFA; Inhale 2 puffs into the lungs every 6 (six) hours as needed for wheezing or shortness of breath.  Dispense: 8 g; Refill: 0 -     predniSONE; 3 tabs poqday 1-2, 2 tabs poqday 3-4, 1 tab poqday 5-6  Dispense: 12 tablet; Refill: 0     Follow up plan: Return if symptoms worsen or fail to improve.  Park Meo, FNP

## 2024-01-27 LAB — SARS-COV-2 RNA, INFLUENZA A/B, AND RSV RNA, QUALITATIVE NAAT
INFLUENZA A RNA: NOT DETECTED
INFLUENZA B RNA: NOT DETECTED
RSV RNA: NOT DETECTED
SARS COV2 RNA: DETECTED — AB

## 2024-01-27 NOTE — Telephone Encounter (Signed)
Copied from CRM [id number]. Topic: Clinical - Lab/Test Results >> Jan 27, 2024  1:19 PM Victorino Dike T wrote: Reason for CRM: Needing results of covid, flu and RSV tests and chest xray to be able to return to work, please call patient [phone]

## 2024-01-28 ENCOUNTER — Encounter: Payer: Self-pay | Admitting: Family Medicine

## 2024-01-29 ENCOUNTER — Other Ambulatory Visit: Payer: Self-pay

## 2024-01-29 NOTE — Telephone Encounter (Signed)
Pls advise, pt requesting for another refill on her cough medicine?

## 2024-02-01 ENCOUNTER — Encounter: Payer: Self-pay | Admitting: Family Medicine

## 2024-02-15 ENCOUNTER — Other Ambulatory Visit: Payer: Self-pay | Admitting: Family Medicine

## 2024-02-16 MED ORDER — AMPHETAMINE-DEXTROAMPHET ER 25 MG PO CP24: 25.0000 mg | ORAL_CAPSULE | ORAL | 0 refills | Status: DC

## 2024-02-18 ENCOUNTER — Encounter: Payer: Self-pay | Admitting: Family Medicine

## 2024-02-23 ENCOUNTER — Encounter: Payer: Self-pay | Admitting: Family Medicine

## 2024-02-23 ENCOUNTER — Other Ambulatory Visit: Payer: Self-pay | Admitting: Family Medicine

## 2024-02-23 ENCOUNTER — Telehealth (INDEPENDENT_AMBULATORY_CARE_PROVIDER_SITE_OTHER): Payer: 59 | Admitting: Family Medicine

## 2024-02-23 ENCOUNTER — Telehealth: Payer: Self-pay | Admitting: Family Medicine

## 2024-02-23 DIAGNOSIS — F909 Attention-deficit hyperactivity disorder, unspecified type: Secondary | ICD-10-CM | POA: Diagnosis not present

## 2024-02-23 MED ORDER — AMPHETAMINE-DEXTROAMPHET ER 30 MG PO CP24: 30.0000 mg | ORAL_CAPSULE | Freq: Every day | ORAL | 0 refills | Status: DC

## 2024-02-23 NOTE — Telephone Encounter (Unsigned)
 Copied from CRM [id number]. Topic: Clinical - Medication Question >> Feb 23, 2024  2:37 PM Gildardo Pounds wrote: Reason for CRM: Apolinar Junes with [facility] is inquiring if you want the patient to start the amphetamine-dextroamphetamine (ADDERALL XR) 30 MG 24 hr capsule and stop the amphetamine-dextroamphetamine (ADDERALL XR) 25 MG 24 hr capsule. Or do you want the patient to complete the amphetamine-dextroamphetamine (ADDERALL XR) 25 MG 24 hr capsule, and then start taking the amphetamine-dextroamphetamine (ADDERALL XR) 30 MG 24 hr capsule. Callback number is [phone]

## 2024-02-23 NOTE — Telephone Encounter (Signed)
 Copied from CRM [id number]. Topic: Clinical - Prescription Issue >> Feb 23, 2024  2:21 PM Alvino Blood C wrote: Reason for CRM: Patient states she was informed by her pharmacy they would need clarification on her medication amphetamine-dextroamphetamine (ADDERALL XR) 30 MG 24 hr capsule due to the provider changing the quantity/ dosage amount

## 2024-02-23 NOTE — Progress Notes (Signed)
 Virtual Visit via Video note  I connected with Sandra Parker on 02/23/24 at 0749 by video and verified that I am speaking with the correct person using two identifiers. Sandra Parker is currently located at work and no one is currently with her during visit. The provider, Park Meo, FNP is located in their office at time of visit.  I discussed the limitations, risks, security and privacy concerns of performing an evaluation and management service by video and the availability of in person appointments. I also discussed with the patient that there may be a patient responsible charge related to this service. The patient expressed understanding and agreed to proceed.  Subjective: PCP: Park Meo, FNP  No chief complaint on file.   HPI Sandra Parker is here today because she feels like her ADHD is uncontrolled. By the end of the day she feels like her meds are wearing off and she is finding difficulty focusing and getting easily distracted. This is interfering with work. Sandra Parker has not tried other medications in the past or been on a higher dose of Adderall XR but would like to try a dose increase.   ADHD FOLLOW UP ADHD status: uncontrolled Satisfied with current therapy: no Medication compliance:  excellent compliance Controlled substance contract: yes Previous psychiatry evaluation: yes Previous medications: no adderall XR   Taking meds on weekends/vacations: no Work/school performance:  good Difficulty sustaining attention/completing tasks: yes Distracted by extraneous stimuli: yes Does not listen when spoken to: no  Fidgets with hands or feet: no Unable to stay in seat: no Blurts out/interrupts others: no ADHD Medication Side Effects: no    Decreased appetite: no    Headache: no    Sleeping disturbance pattern: no    Irritability: no    Rebound effects (worse than baseline) off medication: no    Anxiousness: no    Dizziness: no    Tics: no   ROS: Per HPI  Current  Outpatient Medications:    amphetamine-dextroamphetamine (ADDERALL XR) 30 MG 24 hr capsule, Take 1 capsule (30 mg total) by mouth daily., Disp: 30 capsule, Rfl: 0   acyclovir (ZOVIRAX) 400 MG tablet, Take 1 tablet (400 mg total) by mouth 2 (two) times daily. (Patient not taking: Reported on 12/28/2023), Disp: 180 tablet, Rfl: 2   albuterol (VENTOLIN HFA) 108 (90 Base) MCG/ACT inhaler, Inhale 2 puffs into the lungs every 6 (six) hours as needed for wheezing or shortness of breath., Disp: 8 g, Rfl: 0   ALPRAZolam (XANAX) 0.5 MG tablet, Take 1 tablet (0.5 mg total) by mouth 2 (two) times daily as needed for anxiety., Disp: 30 tablet, Rfl: 0   benzonatate (TESSALON) 100 MG capsule, Take 1 capsule (100 mg total) by mouth 3 (three) times daily as needed for cough., Disp: 30 capsule, Rfl: 0   cycloSPORINE (RESTASIS) 0.05 % ophthalmic emulsion, Place 1 drop into both eyes 2 (two) times daily., Disp: , Rfl:    DOTTI 0.05 MG/24HR patch, Place 1 patch onto the skin 2 (two) times a week., Disp: , Rfl:    fluticasone (FLONASE) 50 MCG/ACT nasal spray, Place 1 spray into both nostrils daily as needed for allergies., Disp: , Rfl:    HYDROcodone bit-homatropine (HYCODAN) 5-1.5 MG/5ML syrup, Take 5 mLs by mouth every 8 (eight) hours as needed for cough., Disp: 120 mL, Rfl: 0   ipratropium (ATROVENT) 0.03 % nasal spray, Place 2 sprays into both nostrils every 12 (twelve) hours., Disp: 30 mL, Rfl: 0  meclizine (ANTIVERT) 12.5 MG tablet, Take 1 tablet (12.5 mg total) by mouth 3 (three) times daily as needed for dizziness., Disp: 20 tablet, Rfl: 0   melatonin 3 MG TABS tablet, Take 1 tablet (3 mg total) by mouth at bedtime., Disp: 90 tablet, Rfl: 0   Multiple Vitamins-Calcium (ONE-A-DAY WOMENS FORMULA PO), Take 1 drop by mouth daily., Disp: , Rfl:    NEOMYCIN-POLYMYXIN-HYDROCORTISONE (CORTISPORIN) 1 % SOLN OTIC solution, Place 3 drops into both ears 4 (four) times daily., Disp: 10 mL, Rfl: 0   pantoprazole (PROTONIX) 40  MG tablet, Take 1 tablet (40 mg total) by mouth 2 (two) times daily., Disp: 60 tablet, Rfl: 0   predniSONE (DELTASONE) 20 MG tablet, 3 tabs poqday 1-2, 2 tabs poqday 3-4, 1 tab poqday 5-6, Disp: 12 tablet, Rfl: 0   Rimegepant Sulfate 75 MG TBDP, Take 75 mg by mouth daily as needed (Migraines)., Disp: , Rfl:    tretinoin (RETIN-A) 0.025 % gel, Apply topically at bedtime., Disp: 45 g, Rfl: 0   VAGIFEM 10 MCG TABS vaginal tablet, Place 1 tablet vaginally at bedtime., Disp: , Rfl:   Observations/Objective: Physical Exam Constitutional:      General: She is not in acute distress.    Appearance: Normal appearance. She is not toxic-appearing.  Pulmonary:     Effort: Pulmonary effort is normal. No respiratory distress.  Neurological:     General: No focal deficit present.     Mental Status: She is alert and oriented to person, place, and time.  Psychiatric:        Mood and Affect: Mood normal.        Behavior: Behavior normal.        Thought Content: Thought content normal.        Judgment: Judgment normal.    Assessment and Plan: Attention deficit hyperactivity disorder (ADHD), unspecified ADHD type Assessment & Plan: Uncontrolled. Increase Adderall XR to 30mg  daily. Reassess effectiveness in 1-2 weeks or sooner if needed. Encouraged adequate daily calorie intake, especially in early morning and in evening. Report changes in weight, appetite, or breakthrough symptoms.    Other orders -     Amphetamine-Dextroamphet ER; Take 1 capsule (30 mg total) by mouth daily.  Dispense: 30 capsule; Refill: 0    Follow Up Instructions: Return in about 2 weeks (around 03/08/2024) for follow-up.   I discussed the assessment and treatment plan with the patient. The patient was provided an opportunity to ask questions and all were answered. The patient agreed with the plan and demonstrated an understanding of the instructions.   The patient was advised to call back or seek an in-person evaluation if the  symptoms worsen or if the condition fails to improve as anticipated.  The above assessment and management plan was discussed with the patient. The patient verbalized understanding of and has agreed to the management plan. Patient is aware to call the clinic if symptoms persist or worsen. Patient is aware when to return to the clinic for a follow-up visit. Patient educated on when it is appropriate to go to the emergency department.   Time call ended: 0759  I provided 10 minutes of face-to-face time during this encounter.   Kurtis Bushman, MSN, APRN, FNP-C [facility]

## 2024-02-23 NOTE — Assessment & Plan Note (Signed)
 Uncontrolled. Increase Adderall XR to 30mg  daily. Reassess effectiveness in 1-2 weeks or sooner if needed. Encouraged adequate daily calorie intake, especially in early morning and in evening. Report changes in weight, appetite, or breakthrough symptoms.

## 2024-03-08 ENCOUNTER — Telehealth: Payer: 59 | Admitting: Family Medicine

## 2024-03-29 ENCOUNTER — Encounter: Payer: Self-pay | Admitting: Family Medicine

## 2024-03-30 ENCOUNTER — Other Ambulatory Visit: Payer: Self-pay | Admitting: Family Medicine

## 2024-03-30 MED ORDER — AMPHETAMINE-DEXTROAMPHET ER 30 MG PO CP24: 30.0000 mg | ORAL_CAPSULE | Freq: Every day | ORAL | 0 refills | Status: DC

## 2024-04-25 ENCOUNTER — Ambulatory Visit: Admitting: Family Medicine

## 2024-04-25 ENCOUNTER — Encounter: Payer: Self-pay | Admitting: Family Medicine

## 2024-04-25 VITALS — BP 122/78 | HR 74 | Ht 61.0 in | Wt 145.0 lb

## 2024-04-25 DIAGNOSIS — F909 Attention-deficit hyperactivity disorder, unspecified type: Secondary | ICD-10-CM | POA: Diagnosis not present

## 2024-04-25 DIAGNOSIS — E782 Mixed hyperlipidemia: Secondary | ICD-10-CM

## 2024-04-25 DIAGNOSIS — R5383 Other fatigue: Secondary | ICD-10-CM

## 2024-04-25 NOTE — Assessment & Plan Note (Signed)
 TSH, Vitamin D.

## 2024-04-25 NOTE — Assessment & Plan Note (Signed)
 Continue Adderall XR 30mg  daily. Endorses weekend fatigue when not taking, will obtain TSH and Vitamin D.

## 2024-04-25 NOTE — Assessment & Plan Note (Signed)
 Labs today. Family hx CAD. I recommend consuming a heart healthy diet such as Mediterranean diet or DASH diet with whole grains, fruits, vegetable, fish, lean meats, nuts, and olive oil. Limit sweets and processed foods. I also encourage moderate intensity exercise 150 minutes weekly. This is 3-5 times weekly for 30-50 minutes each session. Goal should be pace of 3 miles/hours, or walking 1.5 miles in 30 minutes. The 10-year ASCVD risk score (Arnett DK, et al., 2019) is: 0.8%

## 2024-04-25 NOTE — Progress Notes (Signed)
 Subjective:  HPI: Sandra Parker is a 48 y.o. female presenting on 04/25/2024 for ADHD (3 month f/u/No concerns /Wanted to know if she was getting blood work today)   HPI Patient is in today for ADHD follow-up  ADHD FOLLOW UP ADHD status: controlled Satisfied with current therapy: yes Medication compliance:  excellent compliance Controlled substance contract: yes Previous psychiatry evaluation: yes Previous medications: yes  Adderall XR   Taking meds on weekends/vacations: no Work/school performance:  excellent Difficulty sustaining attention/completing tasks: no Distracted by extraneous stimuli: no Does not listen when spoken to: no  Fidgets with hands or feet: no Unable to stay in seat: no Blurts out/interrupts others: no ADHD Medication Side Effects: no    Decreased appetite: no    Headache: no    Sleeping disturbance pattern: no    Irritability: no    Rebound effects (worse than baseline) off medication: no    Anxiousness: no    Dizziness: no    Tics: no  The 10-year ASCVD risk score (Arnett DK, et al., 2019) is: 0.8%   Values used to calculate the score:     Age: 18 years     Sex: Female     Is Non-Hispanic African American: No     Diabetic: No     Tobacco smoker: No     Systolic Blood Pressure: 122 mmHg     Is BP treated: No     HDL Cholesterol: 66 mg/dL     Total Cholesterol: 217 mg/dL   Review of Systems  All other systems reviewed and are negative.   Relevant past medical history reviewed and updated as indicated.   Past Medical History:  Diagnosis Date   Anxiety    Depression    GERD (gastroesophageal reflux disease)    Macromastia 10/2018   Personal history of colonic polyps 05/23/2022   TMJ (temporomandibular joint syndrome)    Upper respiratory tract infection 11/29/2013     Past Surgical History:  Procedure Laterality Date   APPENDECTOMY     BIOPSY  10/31/2022   Procedure: BIOPSY;  Surgeon: Urban Garden, MD;  [facility];  Service: Gastroenterology;;   BREAST REDUCTION SURGERY Bilateral 11/22/2018   Procedure: BREAST REDUCTION WITH LIPOSUCTION;  Surgeon: Phyllis Breeze, MD;  Location: [facility];  Service: Plastics;  Laterality: Bilateral;  Bilateral    COLONOSCOPY WITH PROPOFOL  N/A 05/21/2022   Procedure: COLONOSCOPY WITH PROPOFOL ;  Surgeon: Urban Garden, MD;  [facility];  Service: Gastroenterology;  Laterality: N/A;  830   COLONOSCOPY WITH PROPOFOL  N/A 08/07/2023   Procedure: COLONOSCOPY WITH PROPOFOL ;  Surgeon: Urban Garden, MD;  [facility];  Service: Gastroenterology;  Laterality: N/A;  8:00am;asa any   ESOPHAGOGASTRODUODENOSCOPY (EGD) WITH PROPOFOL  N/A 10/31/2022   Procedure: ESOPHAGOGASTRODUODENOSCOPY (EGD) WITH PROPOFOL ;  Surgeon: Urban Garden, MD;  [facility];  Service: Gastroenterology;  Laterality: N/A;  1230 ASA 2   HYSTEROSCOPY WITH D & C  08/02/2001   LAPAROSCOPIC CHOLECYSTECTOMY  08/16/2010   POLYPECTOMY  05/21/2022   Procedure: POLYPECTOMY INTESTINAL;  Surgeon: Urban Garden, MD;  [facility];  Service: Gastroenterology;;   POLYPECTOMY  10/31/2022   Procedure: POLYPECTOMY;  Surgeon: Urban Garden, MD;  [facility];  Service: Gastroenterology;;   POLYPECTOMY  08/07/2023   Procedure: POLYPECTOMY INTESTINAL;  Surgeon: Urban Garden, MD;  [facility];  Service: Gastroenterology;;   REDUCTION MAMMAPLASTY Bilateral  bilateral breast reduction nov 2019   TOTAL ABDOMINAL HYSTERECTOMY W/ BILATERAL SALPINGOOPHORECTOMY Left 04/16/2011    Allergies and medications reviewed and updated.   Current Outpatient Medications:    acyclovir  (ZOVIRAX ) 400 MG tablet, Take 1 tablet (400 mg total) by mouth 2 (two) times daily., Disp: 180 tablet, Rfl: 2   ALPRAZolam  (XANAX ) 0.5 MG tablet, Take 1 tablet (0.5 mg total) by mouth 2 (two) times  daily as needed for anxiety., Disp: 30 tablet, Rfl: 0   amphetamine -dextroamphetamine (ADDERALL XR) 30 MG 24 hr capsule, Take 1 capsule (30 mg total) by mouth daily., Disp: 30 capsule, Rfl: 0   benzonatate  (TESSALON ) 100 MG capsule, Take 1 capsule (100 mg total) by mouth 3 (three) times daily as needed for cough., Disp: 30 capsule, Rfl: 0   cycloSPORINE (RESTASIS) 0.05 % ophthalmic emulsion, Place 1 drop into both eyes 2 (two) times daily., Disp: , Rfl:    DOTTI  0.05 MG/24HR patch, Place 1 patch onto the skin 2 (two) times a week., Disp: , Rfl:    fluticasone  (FLONASE ) 50 MCG/ACT nasal spray, Place 1 spray into both nostrils daily as needed for allergies., Disp: , Rfl:    HYDROcodone  bit-homatropine (HYCODAN) 5-1.5 MG/5ML syrup, Take 5 mLs by mouth every 8 (eight) hours as needed for cough., Disp: 120 mL, Rfl: 0   ipratropium (ATROVENT ) 0.03 % nasal spray, Place 2 sprays into both nostrils every 12 (twelve) hours., Disp: 30 mL, Rfl: 0   meclizine  (ANTIVERT ) 12.5 MG tablet, Take 1 tablet (12.5 mg total) by mouth 3 (three) times daily as needed for dizziness., Disp: 20 tablet, Rfl: 0   melatonin 3 MG TABS tablet, Take 1 tablet (3 mg total) by mouth at bedtime., Disp: 90 tablet, Rfl: 0   Multiple Vitamins-Calcium (ONE-A-DAY WOMENS FORMULA PO), Take 1 drop by mouth daily., Disp: , Rfl:    NEOMYCIN -POLYMYXIN-HYDROCORTISONE (CORTISPORIN) 1 % SOLN OTIC solution, Place 3 drops into both ears 4 (four) times daily., Disp: 10 mL, Rfl: 0   pantoprazole  (PROTONIX ) 40 MG tablet, Take 1 tablet (40 mg total) by mouth 2 (two) times daily., Disp: 60 tablet, Rfl: 0   predniSONE  (DELTASONE ) 20 MG tablet, 3 tabs poqday 1-2, 2 tabs poqday 3-4, 1 tab poqday 5-6, Disp: 12 tablet, Rfl: 0   Rimegepant Sulfate 75 MG TBDP, Take 75 mg by mouth daily as needed (Migraines)., Disp: , Rfl:    tretinoin  (RETIN-A ) 0.025 % gel, Apply topically at bedtime., Disp: 45 g, Rfl: 0   VAGIFEM  10 MCG TABS vaginal tablet, Place 1 tablet  vaginally at bedtime., Disp: , Rfl:   Allergies  Allergen Reactions   Prozac  [Fluoxetine ] Other (See Comments)    HEADACHES   Wellbutrin [Bupropion] Other (See Comments)    NIGHTMARES   Cephalexin Rash   Ciprofloxacin Rash   Relafen [Nabumetone] Other (See Comments)    UNKNOWN    Objective:   BP 122/78   Pulse 74   Ht 5\' 1"  (1.549 m)   Wt 145 lb (65.8 kg)   SpO2 99%   BMI 27.40 kg/m      04/25/2024    8:02 AM 01/26/2024    3:24 PM 01/07/2024    2:28 PM  Vitals with BMI  Height 5\' 1"  5\' 1"  5\' 1"   Weight 145 lbs 146 lbs 144 lbs  BMI 27.41 27.6 27.22  Systolic 122 120 161  Diastolic 78 80 82  Pulse 74 107 72     Physical Exam Vitals and nursing note reviewed.  Constitutional:      Appearance: Normal appearance. She is normal weight.  HENT:     Head: Normocephalic and atraumatic.  Cardiovascular:     Rate and Rhythm: Normal rate and regular rhythm.     Pulses: Normal pulses.     Heart sounds: Normal heart sounds.  Pulmonary:     Effort: Pulmonary effort is normal.     Breath sounds: Normal breath sounds.  Skin:    General: Skin is warm and dry.  Neurological:     General: No focal deficit present.     Mental Status: She is alert and oriented to person, place, and time. Mental status is at baseline.  Psychiatric:        Mood and Affect: Mood normal.        Behavior: Behavior normal.        Thought Content: Thought content normal.        Judgment: Judgment normal.     Assessment & Plan:  Attention deficit hyperactivity disorder (ADHD), unspecified ADHD type Assessment & Plan: Continue Adderall XR 30mg  daily. Endorses weekend fatigue when not taking, will obtain TSH and Vitamin D.   Moderate mixed hyperlipidemia not requiring statin therapy Assessment & Plan: Labs today. Family hx CAD. I recommend consuming a heart healthy diet such as Mediterranean diet or DASH diet with whole grains, fruits, vegetable, fish, lean meats, nuts, and olive oil. Limit sweets  and processed foods. I also encourage moderate intensity exercise 150 minutes weekly. This is 3-5 times weekly for 30-50 minutes each session. Goal should be pace of 3 miles/hours, or walking 1.5 miles in 30 minutes. The 10-year ASCVD risk score (Arnett DK, et al., 2019) is: 0.8%   Orders: -     Lipid panel  Other fatigue Assessment & Plan: TSH, Vitamin D.   Orders: -     VITAMIN D 25 Hydroxy (Vit-D Deficiency, Fractures) -     TSH     Follow up plan: Return in about 3 months (around 07/25/2024) for ADHD follow-up.  Jenelle Mis, FNP

## 2024-04-27 ENCOUNTER — Encounter: Payer: Self-pay | Admitting: Family Medicine

## 2024-04-28 ENCOUNTER — Other Ambulatory Visit: Payer: Self-pay

## 2024-04-28 DIAGNOSIS — E782 Mixed hyperlipidemia: Secondary | ICD-10-CM

## 2024-04-29 ENCOUNTER — Telehealth: Payer: Self-pay

## 2024-04-29 NOTE — Telephone Encounter (Signed)
 Copied from CRM [id number]. Topic: General - Call Back - No Documentation >> Apr 29, 2024  1:43 PM Stanly Early wrote: Reason for CRM: Patient spoke to Harrell Lima S about setting up an apt for CT scan, looks like MD Gwen Lek, [facility] still needs to sign off. There is no appt in at the moment.Patient would like communication through mychart

## 2024-05-02 ENCOUNTER — Other Ambulatory Visit: Payer: Self-pay | Admitting: Gastroenterology

## 2024-05-02 ENCOUNTER — Other Ambulatory Visit: Payer: Self-pay | Admitting: Family Medicine

## 2024-05-02 ENCOUNTER — Ambulatory Visit: Admitting: Family Medicine

## 2024-05-02 DIAGNOSIS — K219 Gastro-esophageal reflux disease without esophagitis: Secondary | ICD-10-CM

## 2024-05-03 LAB — LIPID PANEL
Cholesterol: 241 mg/dL — ABNORMAL HIGH (ref <200)
HDL: 68 mg/dL (ref >=50)
LDL Cholesterol (Calc): 158 mg/dL — ABNORMAL HIGH
Non-HDL Cholesterol (Calc): 173 mg/dL — ABNORMAL HIGH (ref <130)
Total CHOL/HDL Ratio: 3.5 (calc) (ref <5.0)
Triglycerides: 60 mg/dL (ref <150)

## 2024-05-03 LAB — TEST AUTHORIZATION

## 2024-05-03 LAB — TSH: TSH: 1.1 m[IU]/L

## 2024-05-03 LAB — VITAMIN D 25 HYDROXY (VIT D DEFICIENCY, FRACTURES): Vit D, 25-Hydroxy: 67 ng/mL (ref 30–100)

## 2024-05-03 LAB — LIPOPROTEIN A (LPA): Lipoprotein (a): <10 nmol/L (ref <75)

## 2024-05-03 MED ORDER — PANTOPRAZOLE SODIUM 40 MG PO TBEC: 40.0000 mg | DELAYED_RELEASE_TABLET | Freq: Two times a day (BID) | ORAL | 0 refills | Status: DC

## 2024-05-03 MED ORDER — AMPHETAMINE-DEXTROAMPHET ER 30 MG PO CP24: 30.0000 mg | ORAL_CAPSULE | Freq: Every day | ORAL | 0 refills | Status: DC

## 2024-05-04 ENCOUNTER — Telehealth: Payer: Self-pay

## 2024-05-04 ENCOUNTER — Other Ambulatory Visit: Payer: Self-pay | Admitting: Family Medicine

## 2024-05-04 DIAGNOSIS — E782 Mixed hyperlipidemia: Secondary | ICD-10-CM

## 2024-05-04 DIAGNOSIS — Z8249 Family history of ischemic heart disease and other diseases of the circulatory system: Secondary | ICD-10-CM

## 2024-05-04 NOTE — Telephone Encounter (Signed)
 Copied from CRM [id number]. Topic: Clinical - Request for Lab/Test Order >> May 04, 2024 12:53 PM Opal Bill wrote: Reason for CRM: Pt called to check status of order for Cardiac CT. Its still pending from 04/28/24. Can patient get an update on this?

## 2024-05-09 ENCOUNTER — Telehealth: Payer: Self-pay

## 2024-05-09 NOTE — Telephone Encounter (Signed)
 Copied from CRM [id number]. Topic: Clinical - Request for Lab/Test Order >> May 04, 2024 12:53 PM Opal Bill wrote: Reason for CRM: Pt called to check status of order for Cardiac CT. Its still pending from 04/28/24. Can patient get an update on this? >> May 09, 2024 12:18 PM Crispin Dolphin wrote: Patient called back. Referral now Authorized. Let her know looks like for [facility]. Patient states that is not convenient and would like to know if she can go to [facility] or [facility]. Would like a call back. Thank You

## 2024-05-20 ENCOUNTER — Other Ambulatory Visit: Payer: Self-pay | Admitting: Family Medicine

## 2024-05-20 DIAGNOSIS — R42 Dizziness and giddiness: Secondary | ICD-10-CM

## 2024-05-20 NOTE — Telephone Encounter (Signed)
 Copied from CRM [id number]. Topic: Clinical - Medication Refill >> May 20, 2024  2:40 PM Talmadge Fail S wrote: Medication: meclizine  (ANTIVERT ) 12.5 MG tablet  Patient is requesting an emergency refill of medication to get her to her appt on 07/02 Has the patient contacted their pharmacy? Yes (Agent: If no, request that the patient contact the pharmacy for the refill. If patient does not wish to contact the pharmacy document the reason why and proceed with request.) (Agent: If yes, when and what did the pharmacy advise?)  This is the patient's preferred pharmacy:  [facility] - [place], [place] - [address] [place] [address] Phone: [phone] Fax: [phone]  Is this the correct pharmacy for this prescription? Yes If no, delete pharmacy and type the correct one.   Has the prescription been filled recently? No  Is the patient out of the medication? Yes  Has the patient been seen for an appointment in the last year OR does the patient have an upcoming appointment? Yes, upcoming appt  Can we respond through MyChart? Yes  Agent: Please be advised that Rx refills may take up to 3 business days. We ask that you follow-up with your pharmacy.

## 2024-05-24 NOTE — Telephone Encounter (Signed)
 Requested medication (s) are due for refill today: routing for review  Requested medication (s) are on the active medication list: yes  Last refill:  09/23/21  Future visit scheduled: no  Notes to clinic:  Unable to refill per protocol, cannot delegate. Last refilled 09/23/21      Requested Prescriptions  Pending Prescriptions Disp Refills   meclizine  (ANTIVERT ) 12.5 MG tablet 20 tablet 0    Sig: Take 1 tablet (12.5 mg total) by mouth 3 (three) times daily as needed for dizziness.     Not Delegated - Gastroenterology: Antiemetics Failed - 05/24/2024  2:37 PM      Failed - This refill cannot be delegated      Passed - Valid encounter within last 6 months    Recent Outpatient Visits           4 weeks ago Attention deficit hyperactivity disorder (ADHD), unspecified ADHD type   [place] [facility] Jenelle Mis, FNP   3 months ago Attention deficit hyperactivity disorder (ADHD), unspecified ADHD type   [facility] Jenelle Mis, FNP   3 months ago URI with cough and congestion   [place] [facility] Jenelle Mis, FNP   4 months ago Acute viral sinusitis   [place] [facility] Jenelle Mis, FNP   4 months ago Physical exam, annual   [place] [facility] Jenelle Mis, FNP

## 2024-05-26 ENCOUNTER — Ambulatory Visit (HOSPITAL_COMMUNITY)
Admission: RE | Admit: 2024-05-26 | Discharge: 2024-05-26 | Disposition: A | Discharge status: Home / Self Care | Payer: Self-pay | Source: Ambulatory Visit | Attending: Family Medicine | Admitting: Family Medicine

## 2024-05-26 DIAGNOSIS — Z8249 Family history of ischemic heart disease and other diseases of the circulatory system: Secondary | ICD-10-CM | POA: Insufficient documentation

## 2024-05-26 DIAGNOSIS — E782 Mixed hyperlipidemia: Secondary | ICD-10-CM | POA: Insufficient documentation

## 2024-05-30 ENCOUNTER — Ambulatory Visit: Payer: Self-pay | Admitting: Family Medicine

## 2024-06-13 ENCOUNTER — Other Ambulatory Visit: Payer: Self-pay | Admitting: Family Medicine

## 2024-06-16 ENCOUNTER — Telehealth: Admitting: Physician Assistant

## 2024-06-16 DIAGNOSIS — L290 Pruritus ani: Secondary | ICD-10-CM

## 2024-06-16 MED ORDER — FLUCONAZOLE 150 MG PO TABS: ORAL_TABLET | ORAL | 0 refills | Status: DC

## 2024-06-16 NOTE — Patient Instructions (Addendum)
 Sandra Parker, thank you for joining Hyla Maillard, PA-C for today's virtual visit.  While this provider is not your primary care provider (PCP), if your PCP is located in our provider database this encounter information will be shared with them immediately following your visit.   A [facility] MyChart account gives you access to today's visit and all your visits, tests, and labs performed at [facility]  click here if you don't have a [facility] MyChart account or go to [email or web address]  Consent: (Patient) Sandra Parker provided verbal consent for this virtual visit at the beginning of the encounter.  Current Medications:  Current Outpatient Medications:    acyclovir  (ZOVIRAX ) 400 MG tablet, Take 1 tablet (400 mg total) by mouth 2 (two) times daily., Disp: 180 tablet, Rfl: 2   ALPRAZolam  (XANAX ) 0.5 MG tablet, Take 1 tablet (0.5 mg total) by mouth 2 (two) times daily as needed for anxiety., Disp: 30 tablet, Rfl: 0   amphetamine -dextroamphetamine (ADDERALL XR) 30 MG 24 hr capsule, TAKE ONE CAPSULE BY MOUTH DAILY, Disp: 30 capsule, Rfl: 0   cycloSPORINE (RESTASIS) 0.05 % ophthalmic emulsion, Place 1 drop into both eyes 2 (two) times daily., Disp: , Rfl:    DOTTI  0.05 MG/24HR patch, Place 1 patch onto the skin 2 (two) times a week., Disp: , Rfl:    fluticasone  (FLONASE ) 50 MCG/ACT nasal spray, Place 1 spray into both nostrils daily as needed for allergies., Disp: , Rfl:    HYDROcodone  bit-homatropine (HYCODAN) 5-1.5 MG/5ML syrup, Take 5 mLs by mouth every 8 (eight) hours as needed for cough., Disp: 120 mL, Rfl: 0   meclizine  (ANTIVERT ) 12.5 MG tablet, Take 1 tablet (12.5 mg total) by mouth 3 (three) times daily as needed for dizziness., Disp: 20 tablet, Rfl: 0   melatonin 3 MG TABS tablet, Take 1 tablet (3 mg total) by mouth at bedtime., Disp: 90 tablet, Rfl: 0   Multiple Vitamins-Calcium (ONE-A-DAY WOMENS FORMULA PO), Take 1 drop by mouth daily., Disp: , Rfl:     NEOMYCIN -POLYMYXIN-HYDROCORTISONE (CORTISPORIN) 1 % SOLN OTIC solution, Place 3 drops into both ears 4 (four) times daily., Disp: 10 mL, Rfl: 0   pantoprazole  (PROTONIX ) 40 MG tablet, Take 1 tablet (40 mg total) by mouth 2 (two) times daily., Disp: 60 tablet, Rfl: 0   Rimegepant Sulfate 75 MG TBDP, Take 75 mg by mouth daily as needed (Migraines)., Disp: , Rfl:    tretinoin  (RETIN-A ) 0.025 % gel, Apply topically at bedtime., Disp: 45 g, Rfl: 0   VAGIFEM  10 MCG TABS vaginal tablet, Place 1 tablet vaginally at bedtime., Disp: , Rfl:    Medications ordered in this encounter:  No orders of the defined types were placed in this encounter.    *If you need refills on other medications prior to your next appointment, please contact your pharmacy*  Follow-Up: Call back or seek an in-person evaluation if the symptoms worsen or if the condition fails to improve as anticipated.  [facility] [phone]  Other Instructions Keep the skin clean and dry. Use a soft toilet paper when wiping.  Avoid wet wipes with chemicals in them.  Take the Diflucan  as directed. You can apply OTC strength Cortisone cream to the areas short-term to help with itch. If not resolving, or any new or worsening symptoms despite treatment, you will need an in-person evaluation ASAP.   If you have been instructed to have an in-person evaluation today at a local Urgent Care facility, please  use the link below. It will take you to a list of all of our available [facility], including address, phone number and hours of operation. Please do not delay care.  [facility]  If you or a family member do not have a primary care provider, use the link below to schedule a visit and establish care. When you choose a [facility] primary care physician or advanced practice provider, you gain a long-term partner in health. Find a Primary Care Provider  Learn more about [facility]'s in-office and  virtual care options:  - Get Care Now

## 2024-06-16 NOTE — Progress Notes (Signed)
 Virtual Visit Consent   Sandra Parker, you are scheduled for a virtual visit with a [facility] provider today. Just as with appointments in the office, your consent must be obtained to participate. Your consent will be active for this visit and any virtual visit you may have with one of our providers in the next 365 days. If you have a MyChart account, a copy of this consent can be sent to you electronically.  As this is a virtual visit, video technology does not allow for your provider to perform a traditional examination. This may limit your provider's ability to fully assess your condition. If your provider identifies any concerns that need to be evaluated in person or the need to arrange testing (such as labs, EKG, etc.), we will make arrangements to do so. Although advances in technology are sophisticated, we cannot ensure that it will always work on either your end or our end. If the connection with a video visit is poor, the visit may have to be switched to a telephone visit. With either a video or telephone visit, we are not always able to ensure that we have a secure connection.  By engaging in this virtual visit, you consent to the provision of healthcare and authorize for your insurance to be billed (if applicable) for the services provided during this visit. Depending on your insurance coverage, you may receive a charge related to this service.  I need to obtain your verbal consent now. Are you willing to proceed with your visit today? Sandra Parker has provided verbal consent on 06/16/2024 for a virtual visit (video or telephone). Sandra Parker, [place]  Date: 06/16/2024 10:59 AM   Virtual Visit via Video Note   I, Sandra Parker, connected with  Sandra Parker  ([id number], 1976/02/14) on 06/16/24 at 10:45 AM EDT by a video-enabled telemedicine application and verified that I am speaking with the correct person using two identifiers.  Location: Patient: Virtual Visit Location Patient:  Home Provider: Virtual Visit Location Provider: Home Office   I discussed the limitations of evaluation and management by telemedicine and the availability of in person appointments. The patient expressed understanding and agreed to proceed.    History of Present Illness: Sandra Parker is a 48 y.o. who identifies as a female who was assigned female at birth, and is being seen today for itching of perineal region after recent bout of constipation and use of Preparation H cream for potential hemorrhoid. Notes some ongoing constipation due to some of the medications she has taken. Recently had passage of hard stool with subsequent hemorrhoid formation. Denies bleeding or pain. Took OTC preparation H suppositories with resolution of hemorrhoid but noting some itching in perineal region from anus to near the vagina. Some redness without noted rash. Notes no tenesmus.    HPI: HPI  Problems:  Patient Active Problem List   Diagnosis Date Noted   URI with cough and congestion 01/26/2024   Acute viral sinusitis 01/14/2024   Other fatigue 01/07/2024   Physical exam, annual 01/07/2024   Acute otitis externa of both ears 12/28/2023   Low testosterone level in female 10/21/2023   Acute cystitis without hematuria 08/17/2023   Loss of hair 08/11/2023   Autosomal recessive colorectal adenomatous polyposis associated with mutation in MUTYH gene 08/07/2023   Attention deficit hyperactivity disorder (ADHD) 04/16/2023   Encounter to establish care with new doctor 04/16/2023   Moderate mixed hyperlipidemia not requiring statin therapy 04/16/2023   Genetic  testing 06/10/2022   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   History of colonic polyps 05/23/2022   Anal fissure 03/05/2022   Pain of left shoulder joint on movement 02/07/2022   Elevated liver enzymes 01/20/2022   Gastroesophageal reflux disease without esophagitis 12/13/2021   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   History of hysterectomy 10/22/2016    Acne vulgaris 08/28/2015   Insomnia 04/24/2015   Large breasts 09/12/2014   Mid back pain 09/12/2014   Tendinitis of forearm 08/28/2014   Surgical menopause on hormone replacement therapy 08/28/2014   Migraine headache 06/17/2014   Rotator cuff syndrome 04/28/2014   Overweight (BMI 25.0-29.9) 07/20/2013   Pain in joint, shoulder region 07/20/2013   Vertigo 06/19/2013   Anxiety    Depressive disorder     Allergies:  Allergies  Allergen Reactions   Prozac  [Fluoxetine ] Other (See Comments)    HEADACHES   Wellbutrin [Bupropion] Other (See Comments)    NIGHTMARES   Cephalexin Rash   Ciprofloxacin Rash   Relafen [Nabumetone] Other (See Comments)    UNKNOWN   Medications:  Current Outpatient Medications:    acyclovir  (ZOVIRAX ) 400 MG tablet, Take 1 tablet (400 mg total) by mouth 2 (two) times daily., Disp: 180 tablet, Rfl: 2   ALPRAZolam  (XANAX ) 0.5 MG tablet, Take 1 tablet (0.5 mg total) by mouth 2 (two) times daily as needed for anxiety., Disp: 30 tablet, Rfl: 0   amphetamine -dextroamphetamine (ADDERALL XR) 30 MG 24 hr capsule, TAKE ONE CAPSULE BY MOUTH DAILY, Disp: 30 capsule, Rfl: 0   cycloSPORINE (RESTASIS) 0.05 % ophthalmic emulsion, Place 1 drop into both eyes 2 (two) times daily., Disp: , Rfl:    DOTTI  0.05 MG/24HR patch, Place 1 patch onto the skin 2 (two) times a week., Disp: , Rfl:    fluticasone  (FLONASE ) 50 MCG/ACT nasal spray, Place 1 spray into both nostrils daily as needed for allergies., Disp: , Rfl:    HYDROcodone  bit-homatropine (HYCODAN) 5-1.5 MG/5ML syrup, Take 5 mLs by mouth every 8 (eight) hours as needed for cough., Disp: 120 mL, Rfl: 0   meclizine  (ANTIVERT ) 12.5 MG tablet, Take 1 tablet (12.5 mg total) by mouth 3 (three) times daily as needed for dizziness., Disp: 20 tablet, Rfl: 0   melatonin 3 MG TABS tablet, Take 1 tablet (3 mg total) by mouth at bedtime., Disp: 90 tablet, Rfl: 0   Multiple Vitamins-Calcium (ONE-A-DAY WOMENS FORMULA PO), Take 1 drop by mouth  daily., Disp: , Rfl:    NEOMYCIN -POLYMYXIN-HYDROCORTISONE (CORTISPORIN) 1 % SOLN OTIC solution, Place 3 drops into both ears 4 (four) times daily., Disp: 10 mL, Rfl: 0   pantoprazole  (PROTONIX ) 40 MG tablet, Take 1 tablet (40 mg total) by mouth 2 (two) times daily., Disp: 60 tablet, Rfl: 0   Rimegepant Sulfate 75 MG TBDP, Take 75 mg by mouth daily as needed (Migraines)., Disp: , Rfl:    tretinoin  (RETIN-A ) 0.025 % gel, Apply topically at bedtime., Disp: 45 g, Rfl: 0   VAGIFEM  10 MCG TABS vaginal tablet, Place 1 tablet vaginally at bedtime., Disp: , Rfl:   Observations/Objective: Patient is well-developed, well-nourished in no acute distress.  Resting comfortably at home.  Head is normocephalic, atraumatic.  No labored breathing. Speech is clear and coherent with logical content.  Patient is alert and oriented at baseline.   Assessment and Plan: 1. Anal pruritus (Primary)  After bout of constipation and recent hemorrhoid. Limited assessment due to being unable to perform exam of the sensitive area on video. Could be  due to healing of irritated tissue from her hard stool passage versus secondary yeast of the skin versus allergic dermatitis from preparation H use. Will have her keep skin clean and dry. Can apply OTC cortisone to the area. Keep up with bowel regimen to avoid constipation and further hemorrhoids. Will give Diflucan  in case of yeast. In-person follow-up precautions reviewed.  Follow Up Instructions: I discussed the assessment and treatment plan with the patient. The patient was provided an opportunity to ask questions and all were answered. The patient agreed with the plan and demonstrated an understanding of the instructions.  A copy of instructions were sent to the patient via MyChart unless otherwise noted below.   The patient was advised to call back or seek an in-person evaluation if the symptoms worsen or if the condition fails to improve as anticipated.    Sandra Maillard, PA-C

## 2024-06-30 ENCOUNTER — Encounter: Payer: Self-pay | Admitting: Family Medicine

## 2024-06-30 ENCOUNTER — Encounter: Admitting: Family Medicine

## 2024-06-30 NOTE — Progress Notes (Signed)
 No show for video visit

## 2024-07-20 ENCOUNTER — Ambulatory Visit: Admitting: Family Medicine

## 2024-07-25 ENCOUNTER — Other Ambulatory Visit: Payer: Self-pay | Admitting: Family Medicine

## 2024-08-08 ENCOUNTER — Ambulatory Visit: Admitting: Family Medicine

## 2024-08-10 ENCOUNTER — Ambulatory Visit: Admitting: Family Medicine

## 2024-08-10 ENCOUNTER — Encounter: Payer: Self-pay | Admitting: Family Medicine

## 2024-08-10 VITALS — BP 132/88 | HR 81 | Temp 98.0°F | Ht 61.0 in | Wt 143.8 lb

## 2024-08-10 DIAGNOSIS — F909 Attention-deficit hyperactivity disorder, unspecified type: Secondary | ICD-10-CM

## 2024-08-10 DIAGNOSIS — E782 Mixed hyperlipidemia: Secondary | ICD-10-CM | POA: Diagnosis not present

## 2024-08-10 DIAGNOSIS — R42 Dizziness and giddiness: Secondary | ICD-10-CM

## 2024-08-10 DIAGNOSIS — F419 Anxiety disorder, unspecified: Secondary | ICD-10-CM

## 2024-08-10 DIAGNOSIS — K219 Gastro-esophageal reflux disease without esophagitis: Secondary | ICD-10-CM | POA: Diagnosis not present

## 2024-08-10 MED ORDER — MECLIZINE HCL 12.5 MG PO TABS
12.5000 mg | ORAL_TABLET | Freq: Three times a day (TID) | ORAL | 0 refills | Status: AC | PRN
Start: 2024-08-10 — End: ?

## 2024-08-10 MED ORDER — PANTOPRAZOLE SODIUM 20 MG PO TBEC: 20.0000 mg | DELAYED_RELEASE_TABLET | Freq: Every day | ORAL | 1 refills | Status: AC

## 2024-08-10 MED ORDER — AMPHETAMINE-DEXTROAMPHET ER 30 MG PO CP24: 30.0000 mg | ORAL_CAPSULE | Freq: Every day | ORAL | 0 refills | Status: DC

## 2024-08-10 NOTE — Assessment & Plan Note (Signed)
 No need for medication at this time. Will return PRN is symptoms are exacerbated.

## 2024-08-10 NOTE — Assessment & Plan Note (Signed)
 Continue Adderall XR 30mg  daily. Refill provided, UDS UTD, controlled substance agreement signed.

## 2024-08-10 NOTE — Assessment & Plan Note (Signed)
 Chronic well controlled with occasional Meclizine , refill provided. Follow up PRN for exacerbation or changes in symptoms

## 2024-08-10 NOTE — Assessment & Plan Note (Signed)
 Labs today. Family hx CAD. I recommend consuming a heart healthy diet such as Mediterranean diet or DASH diet with whole grains, fruits, vegetable, fish, lean meats, nuts, and olive oil. Limit sweets and processed foods. I also encourage moderate intensity exercise 150 minutes weekly. This is 3-5 times weekly for 30-50 minutes each session. Goal should be pace of 3 miles/hours, or walking 1.5 miles in 30 minutes. The 10-year ASCVD risk score (Arnett DK, et al., 2019) is: 1.1%

## 2024-08-10 NOTE — Progress Notes (Signed)
 Subjective:  HPI: Sandra Parker is a 48 y.o. female presenting on 08/10/2024 for Medical Management of Chronic Issues (Medication change)   HPI Patient is in today for follow up for anxiety, GERD, ADHD.  She reports today her ADHD is well controlled on current regimen 30mg  adderall daily. She does take medications on Saturdays due to if she doesn't she feels very tired on Monday. Anxiety is well controlled however she is feeling more irritable at work and home. Is having more responsibility at work and is irritable with her coworkers, would not like medication, is managing this OK.  GERD is well controlled, she is currently taking 40mg  Protonix  every other day.  ADHD FOLLOW UP ADHD status: controlled Satisfied with current therapy: yes Medication compliance:  excellent compliance Controlled substance contract: yes Previous psychiatry evaluation: yes Previous medications: yes adderall XR and wellbutrin   Taking meds on weekends/vacations: occasionally Work/school performance:  good Difficulty sustaining attention/completing tasks: yes Distracted by extraneous stimuli: yes Does not listen when spoken to: no  Fidgets with hands or feet: yes Unable to stay in seat: no Blurts out/interrupts others: no ADHD Medication Side Effects: yes    Decreased appetite: no    Headache: no    Sleeping disturbance pattern: yes    Irritability: yes    Rebound effects (worse than baseline) off medication: no    Anxiousness: no    Dizziness: no    Tics: no     08/10/2024   10:51 AM 01/26/2024    3:30 PM 01/07/2024    2:36 PM 10/21/2023    4:27 PM  GAD 7 : Generalized Anxiety Score  Nervous, Anxious, on Edge 1 1 0 3  Control/stop worrying 1 1 1 2   Worry too much - different things 1 1 1 2   Trouble relaxing 2 2 1 3   Restless 2 2 2 2   Easily annoyed or irritable 2 1 1 1   Afraid - awful might happen 0 0 0 0  Total GAD 7 Score 9 8 6 13   Anxiety Difficulty Somewhat difficult Somewhat difficult  Somewhat difficult        08/10/2024   10:50 AM 01/26/2024    3:30 PM 01/07/2024    2:36 PM 12/28/2023    9:42 AM 10/21/2023    4:25 PM  Depression screen PHQ 2/9  Decreased Interest 0 2 0 0 1  Down, Depressed, Hopeless 0 1 0 0 1  PHQ - 2 Score 0 3 0 0 2  Altered sleeping 2 2 2 2 3   Tired, decreased energy 1 2 1 1 1   Change in appetite 0 0 0 0 0  Feeling bad or failure about yourself  0 0 0 0 0  Trouble concentrating 1 0 0 1 1  Moving slowly or fidgety/restless 0 0 0 0 0  Suicidal thoughts 0 0 0 0 0  PHQ-9 Score 4 7 3 4 7   Difficult doing work/chores Somewhat difficult Somewhat difficult Somewhat difficult Not difficult at all Not difficult at all      Review of Systems  All other systems reviewed and are negative.   Relevant past medical history reviewed and updated as indicated.   Past Medical History:  Diagnosis Date   Anxiety    Depression    GERD (gastroesophageal reflux disease)    Macromastia 10/2018   Personal history of colonic polyps 05/23/2022   TMJ (temporomandibular joint syndrome)    Upper respiratory tract infection 11/29/2013     Past  Surgical History:  Procedure Laterality Date   APPENDECTOMY     BIOPSY  10/31/2022   Procedure: BIOPSY;  Surgeon: Eartha Angelia Sieving, MD;  [facility];  Service: Gastroenterology;;   BREAST REDUCTION SURGERY Bilateral 11/22/2018   Procedure: BREAST REDUCTION WITH LIPOSUCTION;  Surgeon: Marcus Lung, MD;  Location: [facility];  Service: Plastics;  Laterality: Bilateral;  Bilateral    COLONOSCOPY WITH PROPOFOL  N/A 05/21/2022   Procedure: COLONOSCOPY WITH PROPOFOL ;  Surgeon: Eartha Angelia Sieving, MD;  Location: [facility];  Service: Gastroenterology;  Laterality: N/A;  830   COLONOSCOPY WITH PROPOFOL  N/A 08/07/2023   Procedure: COLONOSCOPY WITH PROPOFOL ;  Surgeon: Eartha Angelia Sieving, MD;  Location: [facility];  Service: Gastroenterology;  Laterality: N/A;  8:00am;asa any    ESOPHAGOGASTRODUODENOSCOPY (EGD) WITH PROPOFOL  N/A 10/31/2022   Procedure: ESOPHAGOGASTRODUODENOSCOPY (EGD) WITH PROPOFOL ;  Surgeon: Eartha Angelia Sieving, MD;  [facility]: [facility];  Service: Gastroenterology;  Laterality: N/A;  1230 ASA 2   HYSTEROSCOPY WITH D & C  08/02/2001   LAPAROSCOPIC CHOLECYSTECTOMY  08/16/2010   POLYPECTOMY  05/21/2022   Procedure: POLYPECTOMY INTESTINAL;  Surgeon: Eartha Angelia Sieving, MD;  [facility];  Service: Gastroenterology;;   POLYPECTOMY  10/31/2022   Procedure: POLYPECTOMY;  Surgeon: Eartha Angelia Sieving, MD;  [facility];  Service: Gastroenterology;;   POLYPECTOMY  08/07/2023   Procedure: POLYPECTOMY INTESTINAL;  Surgeon: Eartha Angelia, Sieving, MD;  [facility];  Service: Gastroenterology;;   REDUCTION MAMMAPLASTY Bilateral    bilateral breast reduction nov 2019   TOTAL ABDOMINAL HYSTERECTOMY W/ BILATERAL SALPINGOOPHORECTOMY Left 04/16/2011    Allergies and medications reviewed and updated.   Current Outpatient Medications:    acyclovir  (ZOVIRAX ) 400 MG tablet, Take 1 tablet (400 mg total) by mouth 2 (two) times daily., Disp: 180 tablet, Rfl: 2   ALPRAZolam  (XANAX ) 0.5 MG tablet, Take 1 tablet (0.5 mg total) by mouth 2 (two) times daily as needed for anxiety., Disp: 30 tablet, Rfl: 0   DOTTI  0.05 MG/24HR patch, Place 1 patch onto the skin 2 (two) times a week., Disp: , Rfl:    melatonin 3 MG TABS tablet, Take 1 tablet (3 mg total) by mouth at bedtime., Disp: 90 tablet, Rfl: 0   Multiple Vitamins-Calcium (ONE-A-DAY WOMENS FORMULA PO), Take 1 drop by mouth daily., Disp: , Rfl:    amphetamine -dextroamphetamine (ADDERALL XR) 30 MG 24 hr capsule, Take 1 capsule (30 mg total) by mouth daily., Disp: 30 capsule, Rfl: 0   cycloSPORINE (RESTASIS) 0.05 % ophthalmic emulsion, Place 1 drop into both eyes 2 (two) times daily. (Patient not taking: Reported on 08/10/2024), Disp: , Rfl:    fluconazole  (DIFLUCAN )  150 MG tablet, Take 1 tablet PO once. Repeat in 3 days if needed. (Patient not taking: Reported on 08/10/2024), Disp: 2 tablet, Rfl: 0   fluticasone  (FLONASE ) 50 MCG/ACT nasal spray, Place 1 spray into both nostrils daily as needed for allergies. (Patient not taking: Reported on 08/10/2024), Disp: , Rfl:    HYDROcodone  bit-homatropine (HYCODAN) 5-1.5 MG/5ML syrup, Take 5 mLs by mouth every 8 (eight) hours as needed for cough. (Patient not taking: Reported on 08/10/2024), Disp: 120 mL, Rfl: 0   meclizine  (ANTIVERT ) 12.5 MG tablet, Take 1 tablet (12.5 mg total) by mouth 3 (three) times daily as needed for dizziness., Disp: 20 tablet, Rfl: 0   NEOMYCIN -POLYMYXIN-HYDROCORTISONE (CORTISPORIN) 1 % SOLN OTIC solution, Place 3 drops into both ears 4 (four) times daily. (Patient not taking:  Reported on 08/10/2024), Disp: 10 mL, Rfl: 0   pantoprazole  (PROTONIX ) 20 MG tablet, Take 1 tablet (20 mg total) by mouth daily., Disp: 90 tablet, Rfl: 1   Rimegepant Sulfate 75 MG TBDP, Take 75 mg by mouth daily as needed (Migraines). (Patient not taking: Reported on 08/10/2024), Disp: , Rfl:    tretinoin  (RETIN-A ) 0.025 % gel, Apply topically at bedtime. (Patient not taking: Reported on 08/10/2024), Disp: 45 g, Rfl: 0   VAGIFEM  10 MCG TABS vaginal tablet, Place 1 tablet vaginally at bedtime. (Patient not taking: Reported on 08/10/2024), Disp: , Rfl:   Allergies  Allergen Reactions   Prozac  [Fluoxetine ] Other (See Comments)    HEADACHES   Wellbutrin [Bupropion] Other (See Comments)    NIGHTMARES   Cephalexin Rash   Ciprofloxacin Rash   Relafen [Nabumetone] Other (See Comments)    UNKNOWN    Objective:   BP 132/88   Pulse 81   Temp 98 F (36.7 C)   Ht 5' 1 (1.549 m)   Wt 143 lb 12.8 oz (65.2 kg)   SpO2 98%   BMI 27.17 kg/m      08/10/2024   10:39 AM 06/30/2024    8:20 AM 04/25/2024    8:02 AM  Vitals with BMI  Height 5' 1 5' 1 5' 1  Weight 143 lbs 13 oz 140 lbs 145 lbs  BMI 27.18 26.47 27.41  Systolic  132  122  Diastolic 88  78  Pulse 81  74     Physical Exam Vitals and nursing note reviewed.  Constitutional:      Appearance: Normal appearance. She is normal weight.  HENT:     Head: Normocephalic and atraumatic.  Skin:    General: Skin is warm and dry.  Neurological:     General: No focal deficit present.     Mental Status: She is alert and oriented to person, place, and time. Mental status is at baseline.  Psychiatric:        Mood and Affect: Mood normal.        Behavior: Behavior normal.        Thought Content: Thought content normal.        Judgment: Judgment normal.     Assessment & Plan:  Attention deficit hyperactivity disorder (ADHD), unspecified ADHD type Assessment & Plan: Continue Adderall XR 30mg  daily. Refill provided, UDS UTD, controlled substance agreement signed.    Gastroesophageal reflux disease without esophagitis Assessment & Plan: Well controlled on Protonix  40mg  every other day, decrease to 20mg  every other day. Elevated HOB if needed and avoid lying down 2-3 hours after eating, avoid coffee, alcohol, chocolate, fatty foods, citrus, carbonated beverages, spicy foods, late meals, and smoking. Return to office if symptoms return or worsen and seek medical care for difficulty swallowing, bleeding, anemia, weight loss, or recurrent vomiting.    Orders: -     Pantoprazole  Sodium; Take 1 tablet (20 mg total) by mouth daily.  Dispense: 90 tablet; Refill: 1  Vertigo Assessment & Plan: Chronic well controlled with occasional Meclizine , refill provided. Follow up PRN for exacerbation or changes in symptoms  Orders: -     Meclizine  HCl; Take 1 tablet (12.5 mg total) by mouth 3 (three) times daily as needed for dizziness.  Dispense: 20 tablet; Refill: 0  Moderate mixed hyperlipidemia not requiring statin therapy Assessment & Plan: Labs today. Family hx CAD. I recommend consuming a heart healthy diet such as Mediterranean diet or DASH diet with whole grains,  fruits,  vegetable, fish, lean meats, nuts, and olive oil. Limit sweets and processed foods. I also encourage moderate intensity exercise 150 minutes weekly. This is 3-5 times weekly for 30-50 minutes each session. Goal should be pace of 3 miles/hours, or walking 1.5 miles in 30 minutes. The 10-year ASCVD risk score (Arnett DK, et al., 2019) is: 1.1%   Orders: -     Lipid panel  Anxiety Assessment & Plan: No need for medication at this time. Will return PRN is symptoms are exacerbated.    Other orders -     Amphetamine -Dextroamphet ER; Take 1 capsule (30 mg total) by mouth daily.  Dispense: 30 capsule; Refill: 0     Follow up plan: Return in about 5 months (around 01/10/2025) for annual physical with labs 1 week prior.  Jeoffrey GORMAN Barrio, FNP

## 2024-08-10 NOTE — Assessment & Plan Note (Signed)
 Well controlled on Protonix  40mg  every other day, decrease to 20mg  every other day. Elevated HOB if needed and avoid lying down 2-3 hours after eating, avoid coffee, alcohol, chocolate, fatty foods, citrus, carbonated beverages, spicy foods, late meals, and smoking. Return to office if symptoms return or worsen and seek medical care for difficulty swallowing, bleeding, anemia, weight loss, or recurrent vomiting.

## 2024-08-11 ENCOUNTER — Other Ambulatory Visit: Payer: Self-pay | Admitting: Family Medicine

## 2024-08-11 ENCOUNTER — Other Ambulatory Visit

## 2024-08-11 DIAGNOSIS — Z1321 Encounter for screening for nutritional disorder: Secondary | ICD-10-CM

## 2024-08-11 DIAGNOSIS — E559 Vitamin D deficiency, unspecified: Secondary | ICD-10-CM

## 2024-08-11 LAB — VITAMIN D 25 HYDROXY (VIT D DEFICIENCY, FRACTURES): Vit D, 25-Hydroxy: 48 ng/mL (ref 30–100)

## 2024-08-12 LAB — LIPID PANEL
Cholesterol: 252 mg/dL — ABNORMAL HIGH (ref <200)
HDL: 69 mg/dL (ref >=50)
LDL Cholesterol (Calc): 163 mg/dL — ABNORMAL HIGH
Non-HDL Cholesterol (Calc): 183 mg/dL — ABNORMAL HIGH (ref <130)
Total CHOL/HDL Ratio: 3.7 (calc) (ref <5.0)
Triglycerides: 95 mg/dL (ref <150)

## 2024-08-15 ENCOUNTER — Ambulatory Visit: Payer: Self-pay | Admitting: Family Medicine

## 2024-08-16 ENCOUNTER — Ambulatory Visit: Admitting: Family Medicine

## 2024-09-09 ENCOUNTER — Encounter (INDEPENDENT_AMBULATORY_CARE_PROVIDER_SITE_OTHER): Payer: Self-pay | Admitting: *Deleted

## 2024-10-03 ENCOUNTER — Encounter: Payer: Self-pay | Admitting: Family Medicine

## 2024-10-03 ENCOUNTER — Ambulatory Visit: Admitting: Family Medicine

## 2024-10-12 ENCOUNTER — Encounter (INDEPENDENT_AMBULATORY_CARE_PROVIDER_SITE_OTHER): Payer: Self-pay | Admitting: Gastroenterology

## 2024-10-14 ENCOUNTER — Other Ambulatory Visit: Payer: Self-pay | Admitting: Family Medicine

## 2024-11-07 ENCOUNTER — Ambulatory Visit: Admitting: Family Medicine

## 2024-11-15 ENCOUNTER — Other Ambulatory Visit (HOSPITAL_COMMUNITY): Payer: Self-pay | Admitting: Family Medicine

## 2024-11-15 ENCOUNTER — Ambulatory Visit: Admitting: Family Medicine

## 2024-11-15 DIAGNOSIS — Z1231 Encounter for screening mammogram for malignant neoplasm of breast: Secondary | ICD-10-CM

## 2024-11-22 ENCOUNTER — Ambulatory Visit: Admitting: Family Medicine

## 2024-11-22 VITALS — BP 118/80 | HR 81 | Temp 98.0°F | Ht 61.0 in | Wt 144.6 lb

## 2024-11-22 DIAGNOSIS — E782 Mixed hyperlipidemia: Secondary | ICD-10-CM

## 2024-11-22 DIAGNOSIS — E559 Vitamin D deficiency, unspecified: Secondary | ICD-10-CM

## 2024-11-22 DIAGNOSIS — Z79899 Other long term (current) drug therapy: Secondary | ICD-10-CM

## 2024-11-22 DIAGNOSIS — F909 Attention-deficit hyperactivity disorder, unspecified type: Secondary | ICD-10-CM

## 2024-11-22 MED ORDER — AMPHETAMINE-DEXTROAMPHET ER 30 MG PO CP24: 30.0000 mg | ORAL_CAPSULE | Freq: Every day | ORAL | 0 refills | Status: DC

## 2024-11-22 NOTE — Progress Notes (Signed)
 Acute Office Visit  Patient ID: DESHUNDA THACKSTON, female    DOB: 03-30-1976, 48 y.o.   MRN: [id number]  PCP: Kayla Jeoffrey RAMAN, FNP  Chief Complaint  Patient presents with   Medical Management of Chronic Issues    Medications      Subjective:     HPI Ms Vences is here today for medication management and ADHD follow up. She is maintained on Adderall 30mg  daily.  HPI 08/10/2024: Patient is in today for follow up for anxiety, GERD, ADHD.  She reports today her ADHD is well controlled on current regimen 30mg  adderall daily. She does take medications on Saturdays due to if she doesn't she feels very tired on Monday. Anxiety is well controlled however she is feeling more irritable at work and home. Is having more responsibility at work and is irritable with her coworkers, would not like medication, is managing this OK.  GERD is well controlled, she is currently taking 40mg  Protonix  every other day.   ADHD FOLLOW UP ADHD status: controlled Satisfied with current therapy: yes Medication compliance:  excellent compliance Controlled substance contract: yes Previous psychiatry evaluation: yes Previous medications: yes adderall XR and wellbutrin   Taking meds on weekends/vacations: occasionally Work/school performance:  good Difficulty sustaining attention/completing tasks: yes Distracted by extraneous stimuli: yes Does not listen when spoken to: no  Fidgets with hands or feet: yes Unable to stay in seat: no Blurts out/interrupts others: no ADHD Medication Side Effects: yes    Decreased appetite: no    Headache: no    Sleeping disturbance pattern: yes    Irritability: yes    Rebound effects (worse than baseline) off medication: no    Anxiousness: no    Dizziness: no    Tics: no   Discussed the use of AI scribe software for clinical note transcription with the patient, who gave verbal consent to proceed.  History of Present Illness Babita R Steinert is a 48 year old female who presents for a  follow-up visit to check vitamin D  levels and discuss medication use.  She feels slightly improved on a higher dose of vitamin D , currently taking 5,000 to 6,000 units daily, sourced from [employer], with a regimen of three to four pills a day.  She inquires about the timing for her cholesterol check and notes her coronary artery score was zero. She has not consulted a dietitian.  She uses acyclovir  as needed for cold sores, Xanax  as needed for panic attacks, and Adderall 30 mg daily with breaks on weekends. She reports good sleep and appetite while taking Adderall and denies any side effects.  She uses a Dottie patch and Vagifem  for estrogen replacement, prescribed by her gynecologist, and takes Protonix  every other day for acid reflux, which she finds effective.  She mentions hair thinning and has consulted dermatology, but no specific diagnosis like alopecia was given. Her daughter has thick hair, contrasting with her own thinning hair.  She is scheduled for a colonoscopy in 2026 and reports that her previous colonoscopy was abnormal with polyps. No chest pain, palpitations, or headaches. She is scheduled for a mammogram in January.   Review of Systems  All other systems reviewed and are negative.   Past Medical History:  Diagnosis Date   Anxiety    Depression    GERD (gastroesophageal reflux disease)    Macromastia 10/2018   Personal history of colonic polyps 05/23/2022   TMJ (temporomandibular joint syndrome)    Upper respiratory tract infection 11/29/2013  Past Surgical History:  Procedure Laterality Date   APPENDECTOMY     BIOPSY  10/31/2022   Procedure: BIOPSY;  Surgeon: Eartha Angelia Sieving, MD;  Location: [facility];  Service: Gastroenterology;;   BREAST REDUCTION SURGERY Bilateral 11/22/2018   Procedure: BREAST REDUCTION WITH LIPOSUCTION;  Surgeon: Marcus Lung, MD;  Location: [facility];  Service: Plastics;  Laterality: Bilateral;  Bilateral     COLONOSCOPY WITH PROPOFOL  N/A 05/21/2022   Procedure: COLONOSCOPY WITH PROPOFOL ;  Surgeon: Eartha Angelia Sieving, MD;  [facility];  Service: Gastroenterology;  Laterality: N/A;  830   COLONOSCOPY WITH PROPOFOL  N/A 08/07/2023   Procedure: COLONOSCOPY WITH PROPOFOL ;  Surgeon: Eartha Angelia Sieving, MD;  [facility];  Service: Gastroenterology;  Laterality: N/A;  8:00am;asa any   ESOPHAGOGASTRODUODENOSCOPY (EGD) WITH PROPOFOL  N/A 10/31/2022   Procedure: ESOPHAGOGASTRODUODENOSCOPY (EGD) WITH PROPOFOL ;  Surgeon: Eartha Angelia Sieving, MD;  [facility];  Service: Gastroenterology;  Laterality: N/A;  1230 ASA 2   HYSTEROSCOPY WITH D & C  08/02/2001   LAPAROSCOPIC CHOLECYSTECTOMY  08/16/2010   POLYPECTOMY  05/21/2022   Procedure: POLYPECTOMY INTESTINAL;  Surgeon: Eartha Angelia Sieving, MD;  [facility];  Service: Gastroenterology;;   POLYPECTOMY  10/31/2022   Procedure: POLYPECTOMY;  Surgeon: Eartha Angelia Sieving, MD;  [facility];  Service: Gastroenterology;;   POLYPECTOMY  08/07/2023   Procedure: POLYPECTOMY INTESTINAL;  Surgeon: Eartha Angelia, Sieving, MD;  [facility];  Service: Gastroenterology;;   REDUCTION MAMMAPLASTY Bilateral    bilateral breast reduction nov 2019   TOTAL ABDOMINAL HYSTERECTOMY W/ BILATERAL SALPINGOOPHORECTOMY Left 04/16/2011    Current Outpatient Medications on File Prior to Visit  Medication Sig Dispense Refill   acyclovir  (ZOVIRAX ) 400 MG tablet Take 1 tablet (400 mg total) by mouth 2 (two) times daily. 180 tablet 2   ALPRAZolam  (XANAX ) 0.5 MG tablet Take 1 tablet (0.5 mg total) by mouth 2 (two) times daily as needed for anxiety. 30 tablet 0   DOTTI  0.05 MG/24HR patch Place 1 patch onto the skin 2 (two) times a week.     meclizine  (ANTIVERT ) 12.5 MG tablet Take 1 tablet (12.5 mg total) by mouth 3 (three) times daily as needed for dizziness. 20 tablet 0   melatonin 3 MG TABS  tablet Take 1 tablet (3 mg total) by mouth at bedtime. 90 tablet 0   Multiple Vitamins-Calcium (ONE-A-DAY WOMENS FORMULA PO) Take 1 drop by mouth daily.     pantoprazole  (PROTONIX ) 20 MG tablet Take 1 tablet (20 mg total) by mouth daily. 90 tablet 1   VAGIFEM  10 MCG TABS vaginal tablet Place 1 tablet vaginally at bedtime.     No current facility-administered medications on file prior to visit.    Allergies  Allergen Reactions   Prozac  [Fluoxetine ] Other (See Comments)    HEADACHES   Wellbutrin [Bupropion] Other (See Comments)    NIGHTMARES   Cephalexin Rash   Ciprofloxacin Rash   Relafen [Nabumetone] Other (See Comments)    UNKNOWN       Objective:    BP 118/80   Pulse 81   Temp 98 F (36.7 C)   Ht 5' 1 (1.549 m)   Wt 144 lb 9.6 oz (65.6 kg)   SpO2 99%   BMI 27.32 kg/m    Physical Exam Vitals and nursing note reviewed.  Constitutional:      Appearance: Normal appearance. She is normal weight.  HENT:     Head: Normocephalic  and atraumatic.  Cardiovascular:     Rate and Rhythm: Normal rate and regular rhythm.     Pulses: Normal pulses.     Heart sounds: Normal heart sounds.  Pulmonary:     Effort: Pulmonary effort is normal.     Breath sounds: Normal breath sounds.  Skin:    General: Skin is warm and dry.  Neurological:     General: No focal deficit present.     Mental Status: She is alert and oriented to person, place, and time. Mental status is at baseline.  Psychiatric:        Mood and Affect: Mood normal.        Behavior: Behavior normal.        Thought Content: Thought content normal.        Judgment: Judgment normal.       No results found for any visits on 11/22/24.     Assessment & Plan:   Problem List Items Addressed This Visit     Attention deficit hyperactivity disorder (ADHD) - Primary   Relevant Orders   DRUG MONITOR, PANEL 1, W/CONF, URINE   Moderate mixed hyperlipidemia not requiring statin therapy   Relevant Orders   Lipid panel    Other Visit Diagnoses       Medication management       Relevant Orders   DRUG MONITOR, PANEL 1, W/CONF, URINE     Vitamin D  deficiency       Relevant Orders   VITAMIN D  25 Hydroxy (Vit-D Deficiency, Fractures)       Assessment and Plan Assessment & Plan Vitamin D  deficiency Reports slight improvement on increased vitamin D  dosage. - Ordered vitamin D  level test.  Mixed hyperlipidemia Cholesterol levels are diet managed. Coronary artery score is zero. - Ordered cholesterol test.  Attention-deficit hyperactivity disorder (ADHD) Well-managed on Adderall XR 30 mg daily with no side effects. Takes breaks on weekends. - Continue Adderall XR 30 mg daily.  Anxiety disorder Uses Alprazolam  as needed for panic attacks, particularly during stressful work situations. - Continue Alprazolam  as needed.  Gastroesophageal reflux disease (GERD) Managed with Pantoprazole  every other day, which is effective. - Continue Pantoprazole  every other day.  General Health Maintenance Mammogram scheduled for January. Colonoscopy needed due to previous abnormal findings with polyps. - Will schedule colonoscopy due to previous abnormal findings with polyps. - Will perform urine drug screen.    Meds ordered this encounter  Medications   amphetamine -dextroamphetamine (ADDERALL XR) 30 MG 24 hr capsule    Sig: Take 1 capsule (30 mg total) by mouth daily.    Dispense:  30 capsule    Refill:  0    Return in about 3 months (around 02/22/2025) for annual physical with labs 1 week prior.  Jeoffrey GORMAN Barrio, FNP [facility]

## 2024-11-23 ENCOUNTER — Ambulatory Visit: Payer: Self-pay | Admitting: Family Medicine

## 2024-11-23 LAB — LIPID PANEL
Cholesterol: 196 mg/dL (ref <200)
HDL: 63 mg/dL (ref >=50)
LDL Cholesterol (Calc): 114 mg/dL — ABNORMAL HIGH
Non-HDL Cholesterol (Calc): 133 mg/dL — ABNORMAL HIGH (ref <130)
Total CHOL/HDL Ratio: 3.1 (calc) (ref <5.0)
Triglycerides: 86 mg/dL (ref <150)

## 2024-11-23 LAB — VITAMIN D 25 HYDROXY (VIT D DEFICIENCY, FRACTURES): Vit D, 25-Hydroxy: 56 ng/mL (ref 30–100)

## 2024-11-24 LAB — DRUG MONITOR, PANEL 1, W/CONF, URINE
Amphetamine: 11372 ng/mL — ABNORMAL HIGH (ref <250)
Amphetamines: POSITIVE ng/mL — AB (ref <500)
Barbiturates: NEGATIVE ng/mL (ref <300)
Benzodiazepines: NEGATIVE ng/mL (ref <100)
Cocaine Metabolite: NEGATIVE ng/mL (ref <150)
Creatinine: 129.2 mg/dL (ref >=20.0)
Marijuana Metabolite: NEGATIVE ng/mL (ref <20)
Methadone Metabolite: NEGATIVE ng/mL (ref <100)
Methamphetamine: NEGATIVE ng/mL (ref <250)
Opiates: NEGATIVE ng/mL (ref <100)
Oxidant: NEGATIVE ug/mL (ref <200)
Oxycodone: NEGATIVE ng/mL (ref <100)
Phencyclidine: NEGATIVE ng/mL (ref <25)
pH: 5.9 (ref 4.5–9.0)

## 2024-11-24 LAB — DM TEMPLATE

## 2024-12-05 ENCOUNTER — Ambulatory Visit: Admitting: Family Medicine

## 2024-12-06 ENCOUNTER — Encounter: Admitting: Family Medicine

## 2024-12-06 NOTE — Progress Notes (Signed)
 Attempted to call, no answer. Did not connect with video.

## 2024-12-28 ENCOUNTER — Ambulatory Visit: Payer: Self-pay

## 2024-12-28 NOTE — Telephone Encounter (Signed)
 FYI Only or Action Required?: Action required by provider: request for appointment, clinical question for provider, update on patient condition, and recommended UC, pt refused.  Patient was last seen in primary care on 11/22/2024 by Kayla Jeoffrey RAMAN, FNP.  Called Nurse Triage reporting Otalgia.  Symptoms began yesterday.  Interventions attempted: OTC medications: ibuprofen.  Symptoms are: gradually worsening.  Triage Disposition: See Physician Within 24 Hours  Patient/caregiver understands and will follow disposition?: No, wishes to speak with PCP   Copied from CRM #[id number]. Topic: Clinical - Red Word Triage >> Dec 28, 2024  2:53 PM Donna BRAVO wrote: Red Word that prompted transfer to Nurse Triage:  Started hurting Tuesday Symptoms: right ear pain that goes down back of neck and around to jaw very red and warm to touch  Reason for Disposition  Earache  (Exceptions: Brief ear pain of lasting less than 60 minutes, or earache occurring during air travel.)  Answer Assessment - Initial Assessment Questions Pt called in to report R ear pain that is warm to the touch and red x 24h; pt reports new posterior neck pain. Pt denies SOB, rash, chest pain, no dizziness, h/a or fever. Pt reports her R ear started to hurt yesterday evening then today ear has started to feel warm and she has noticed some neck pain. Pt reports overall pain 6/10, eased with ibuprofen. Discussed d/t worsening symptoms and office closure, this RN recommended UC. Pt states UC is too expensive with her insurance and requested to be squeezed in Friday, 01/02. Stated she knows her PCP is not in office but would see anyone. Discussed I would send request. Discussed home care of rotating ibuprofen and tylenol , warm/cool compress to ear and monitoring for fever. Pt voiced understanding.     1. LOCATION: Which ear is involved?     R ear   2. ONSET: When did the ear pain start?      Yesterday   3. SEVERITY: How bad is the  pain?  (Scale 1-10; mild, moderate or severe)     6/10  4. URI SYMPTOMS: Do you have a runny nose or cough?     None   5. FEVER: Do you have a fever? If Yes, ask: What is your temperature, how was it measured, and when did it start?     None   6. CAUSE: Have you been swimming recently?, How often do you use Q-TIPS?, Have you had any recent air travel or scuba diving?     No   7. OTHER SYMPTOMS: Do you have any other symptoms? (e.g., decreased hearing, dizziness, headache, stiff neck, vomiting)     Swelling, redness to ear; neck pain  Protocols used: Earache-A-AH

## 2024-12-30 ENCOUNTER — Ambulatory Visit: Admitting: Family Medicine

## 2024-12-30 ENCOUNTER — Encounter: Payer: Self-pay | Admitting: Family Medicine

## 2024-12-30 VITALS — BP 122/88 | HR 73 | Temp 98.0°F | Ht 61.0 in | Wt 145.4 lb

## 2024-12-30 DIAGNOSIS — J0100 Acute maxillary sinusitis, unspecified: Secondary | ICD-10-CM | POA: Diagnosis not present

## 2024-12-30 DIAGNOSIS — H6501 Acute serous otitis media, right ear: Secondary | ICD-10-CM | POA: Diagnosis not present

## 2024-12-30 MED ORDER — FLUCONAZOLE 150 MG PO TABS: 150.0000 mg | ORAL_TABLET | Freq: Every day | ORAL | 1 refills | Status: AC

## 2024-12-30 MED ORDER — FLUTICASONE PROPIONATE 50 MCG/ACT NA SUSP: 2.0000 | Freq: Every day | NASAL | 6 refills | Status: AC

## 2024-12-30 MED ORDER — AMOXICILLIN 875 MG PO TABS: 875.0000 mg | ORAL_TABLET | Freq: Two times a day (BID) | ORAL | 0 refills | Status: AC

## 2024-12-30 NOTE — Progress Notes (Signed)
 "  Patient Office Visit  Assessment & Plan:  Right acute serous otitis media, recurrence not specified -     Amoxicillin ; Take 1 tablet (875 mg total) by mouth 2 (two) times daily for 10 days.  Dispense: 20 tablet; Refill: 0 -     Fluconazole ; Take 1 tablet (150 mg total) by mouth daily.  Dispense: 1 tablet; Refill: 1 -     Fluticasone  Propionate; Place 2 sprays into both nostrils daily.  Dispense: 16 g; Refill: 6   Assessment and Plan    Acute right otitis media and externa Right ear pain with external canal swelling. Tympanic membrane normal. Possible referred pain from jaw clenching. Rash with cephalexin, but tolerates amoxicillin . - Prescribed amoxicillin . - Prescribed antifungal for yeast infection prevention.  Acute right maxillary sinusitis Right nasal congestion and postnasal drip. Fluid in right ear. Symptoms consistent with sinusitis. - Prescribed nasal spray for sinus congestion.          No follow-ups on file.   Subjective:    Patient ID: Sandra Parker, female    DOB: [birth year]  Age: 49 y.o. MRN: [id number]  Chief Complaint  Patient presents with   Otalgia    R sided ear pain started on Tuesday. No drainage.    Otalgia    Discussed the use of AI scribe software for clinical note transcription with the patient, who gave verbal consent to proceed.  History of Present Illness       Sandra Parker is a 49 year old female who presents with right ear pain and congestion and sinus pressure  She has been experiencing right ear pain for four days, which radiates to her jaw and disrupts her sleep. She has been taking ibuprofen and Tylenol  for the pain without relief. No fever or chills are present.  She notes congestion, particularly on the right side of her nose, describing it as sore. She sometimes experiences sinus infections, but it has been a while since the last occurrence. The right side of her sinuses feels clogged and sore.  She denies any history of allergies  and does not take any allergy medications. She is not a smoker and mentions that no one at home is currently sick. She has not experienced any recent dental work and denies any pain in her teeth, although she does experience jaw pain. She does not think she has TMJ issues, does not grind or clench that she is aware of.  She has not tried any nasal sprays like Flonase  or Nasonex. She recalls having taken amoxicillin  in the past without issues, although she has had a rash with Keflex. She sometimes gets yeast infections when taking antibiotics.  Physical Exam HEENT: Right external auditory canal swollen. Right tympanic membrane not bulging or red. Left ear normal. CHEST: Lungs clear to auscultation bilaterally.  Assessment and Plan Acute right otitis media and externa Right ear pain with external canal swelling. Tympanic membrane normal. Possible referred pain from jaw clenching. Rash with cephalexin, but tolerates amoxicillin . - Prescribed amoxicillin . - Prescribed antifungal for yeast infection prevention.  Acute right maxillary sinusitis Right nasal congestion and postnasal drip. Fluid in right ear. Symptoms consistent with sinusitis. - Prescribed nasal spray for sinus congestion.    The 10-year ASCVD risk score (Arnett DK, et al., 2019) is: 0.8%  Past Medical History:  Diagnosis Date   Anxiety    Depression    GERD (gastroesophageal reflux disease)    Macromastia 10/2018   Personal history of colonic  polyps 05/23/2022   TMJ (temporomandibular joint syndrome)    Upper respiratory tract infection 11/29/2013   Past Surgical History:  Procedure Laterality Date   APPENDECTOMY     BIOPSY  10/31/2022   Procedure: BIOPSY;  Surgeon: Eartha Angelia Sieving, MD;  [facility]: [facility];  Service: Gastroenterology;;   BREAST REDUCTION SURGERY Bilateral 11/22/2018   Procedure: BREAST REDUCTION WITH LIPOSUCTION;  Surgeon: Marcus Lung, MD;  Location: [facility];   Service: Plastics;  Laterality: Bilateral;  Bilateral    COLONOSCOPY WITH PROPOFOL  N/A 05/21/2022   Procedure: COLONOSCOPY WITH PROPOFOL ;  Surgeon: Eartha Angelia Sieving, MD;  Location: [facility];  Service: Gastroenterology;  Laterality: N/A;  830   COLONOSCOPY WITH PROPOFOL  N/A 08/07/2023   Procedure: COLONOSCOPY WITH PROPOFOL ;  Surgeon: Eartha Angelia Sieving, MD;  Location: [facility];  Service: Gastroenterology;  Laterality: N/A;  8:00am;asa any   ESOPHAGOGASTRODUODENOSCOPY (EGD) WITH PROPOFOL  N/A 10/31/2022   Procedure: ESOPHAGOGASTRODUODENOSCOPY (EGD) WITH PROPOFOL ;  Surgeon: Eartha Angelia Sieving, MD;  Location: [facility];  Service: Gastroenterology;  Laterality: N/A;  1230 ASA 2   HYSTEROSCOPY WITH D & C  08/02/2001   LAPAROSCOPIC CHOLECYSTECTOMY  08/16/2010   POLYPECTOMY  05/21/2022   Procedure: POLYPECTOMY INTESTINAL;  Surgeon: Eartha Angelia Sieving, MD;  [facility]: [facility];  Service: Gastroenterology;;   POLYPECTOMY  10/31/2022   Procedure: POLYPECTOMY;  Surgeon: Eartha Angelia Sieving, MD;  [facility]: [facility];  Service: Gastroenterology;;   POLYPECTOMY  08/07/2023   Procedure: POLYPECTOMY INTESTINAL;  Surgeon: Eartha Angelia, Sieving, MD;  Location: [facility];  Service: Gastroenterology;;   REDUCTION MAMMAPLASTY Bilateral    bilateral breast reduction nov 2019   TOTAL ABDOMINAL HYSTERECTOMY W/ BILATERAL SALPINGOOPHORECTOMY Left 04/16/2011   Social History[1] Family History  Problem Relation Age of Onset   Healthy Mother    Healthy Father    Leukemia Maternal Uncle        d. > 50   Colon cancer Neg Hx    Allergies[2]  Review of Systems  HENT:  Positive for ear pain.       Objective:    BP 122/88   Pulse 73   Temp 98 F (36.7 C)   Ht 5' 1 (1.549 m)   Wt 145 lb 6 oz (65.9 kg)   SpO2 98%   BMI 27.47 kg/m  BP Readings from Last 3 Encounters:  12/30/24 122/88  11/22/24 118/80  08/10/24 132/88   Wt Readings from Last 3  Encounters:  12/30/24 145 lb 6 oz (65.9 kg)  11/22/24 144 lb 9.6 oz (65.6 kg)  08/10/24 143 lb 12.8 oz (65.2 kg)    Physical Exam Vitals and nursing note reviewed.  Constitutional:      General: She is not in acute distress.    Appearance: Normal appearance.  HENT:     Head: Normocephalic.     Jaw: No tenderness or pain on movement.     Right Ear: Swelling and tenderness present. A middle ear effusion is present. There is no impacted cerumen. Tympanic membrane is not bulging.     Left Ear: Tympanic membrane, ear canal and external ear normal. There is no impacted cerumen.  Eyes:     Extraocular Movements: Extraocular movements intact.     Conjunctiva/sclera: Conjunctivae normal.     Pupils: Pupils are equal, round, and reactive to light.  Cardiovascular:     Rate and Rhythm: Normal rate and regular rhythm.     Heart sounds: Normal heart  sounds.  Pulmonary:     Effort: Pulmonary effort is normal.     Breath sounds: Normal breath sounds.  Musculoskeletal:     Right lower leg: No edema.     Left lower leg: No edema.  Neurological:     General: No focal deficit present.     Mental Status: She is alert and oriented to person, place, and time.  Psychiatric:        Mood and Affect: Mood normal.        Behavior: Behavior normal.        Thought Content: Thought content normal.        Judgment: Judgment normal.      No results found for any visits on 12/30/24.          [1]  Social History Tobacco Use   Smoking status: Never   Smokeless tobacco: Never  Vaping Use   Vaping status: Never Used  Substance Use Topics   Alcohol use: Not Currently   Drug use: No  [2]  Allergies Allergen Reactions   Prozac  [Fluoxetine ] Other (See Comments)    HEADACHES   Wellbutrin [Bupropion] Other (See Comments)    NIGHTMARES   Cephalexin Rash   Ciprofloxacin Rash   Relafen [Nabumetone] Other (See Comments)    UNKNOWN   "

## 2025-01-17 ENCOUNTER — Ambulatory Visit: Admitting: Family Medicine

## 2025-01-17 ENCOUNTER — Encounter: Payer: Self-pay | Admitting: Family Medicine

## 2025-01-17 VITALS — BP 120/85 | HR 81 | Temp 97.9°F | Ht 61.0 in | Wt 147.4 lb

## 2025-01-17 DIAGNOSIS — J019 Acute sinusitis, unspecified: Secondary | ICD-10-CM

## 2025-01-17 DIAGNOSIS — B9689 Other specified bacterial agents as the cause of diseases classified elsewhere: Secondary | ICD-10-CM | POA: Diagnosis not present

## 2025-01-17 MED ORDER — DOXYCYCLINE HYCLATE 100 MG PO TABS: 100.0000 mg | ORAL_TABLET | Freq: Two times a day (BID) | ORAL | 0 refills | Status: AC

## 2025-01-17 NOTE — Progress Notes (Signed)
 "  Acute Office Visit  Patient ID: AARTHI UYENO, female    DOB: 08/11/1976, 49 y.o.   MRN: [id number]  PCP: Kayla Jeoffrey RAMAN, FNP  Chief Complaint  Patient presents with   Acute Visit    Sore throat, sinus pressure, headache Last Wednesday      Subjective:     HPI  Discussed the use of AI scribe software for clinical note transcription with the patient, who gave verbal consent to proceed.  History of Present Illness Mally R Louk is a 49 year old female who presents with sinus congestion and postnasal drip.  She has been experiencing sinus congestion for approximately six days, starting last Wednesday, accompanied by postnasal drip described as 'goop' in the back of her throat.  She experiences sinus pressure on both sides of her face, particularly when wearing glasses, with more pronounced pressure on the right side with mucopurulent rhinorrhea.  She has a mild cough but no shortness of breath, fever, chills, or body aches. She has not undergone any flu or COVID testing during this illness.  Her current medications include a multivitamin, which she believes contains a small amount of iron.   Review of Systems  All other systems reviewed and are negative.   Past Medical History:  Diagnosis Date   Anxiety    Depression    GERD (gastroesophageal reflux disease)    Macromastia 10/2018   Personal history of colonic polyps 05/23/2022   TMJ (temporomandibular joint syndrome)    Upper respiratory tract infection 11/29/2013    Past Surgical History:  Procedure Laterality Date   APPENDECTOMY     BIOPSY  10/31/2022   Procedure: BIOPSY;  Surgeon: Eartha Angelia Sieving, MD;  Location: [facility];  Service: Gastroenterology;;   BREAST REDUCTION SURGERY Bilateral 11/22/2018   Procedure: BREAST REDUCTION WITH LIPOSUCTION;  Surgeon: Marcus Lung, MD;  Location: [facility];  Service: Plastics;  Laterality: Bilateral;  Bilateral    COLONOSCOPY WITH PROPOFOL   N/A 05/21/2022   Procedure: COLONOSCOPY WITH PROPOFOL ;  Surgeon: Eartha Angelia Sieving, MD;  [facility]: [facility];  Service: Gastroenterology;  Laterality: N/A;  830   COLONOSCOPY WITH PROPOFOL  N/A 08/07/2023   Procedure: COLONOSCOPY WITH PROPOFOL ;  Surgeon: Eartha Angelia Sieving, MD;  [facility];  Service: Gastroenterology;  Laterality: N/A;  8:00am;asa any   ESOPHAGOGASTRODUODENOSCOPY (EGD) WITH PROPOFOL  N/A 10/31/2022   Procedure: ESOPHAGOGASTRODUODENOSCOPY (EGD) WITH PROPOFOL ;  Surgeon: Eartha Angelia Sieving, MD;  [facility]: [facility];  Service: Gastroenterology;  Laterality: N/A;  1230 ASA 2   HYSTEROSCOPY WITH D & C  08/02/2001   LAPAROSCOPIC CHOLECYSTECTOMY  08/16/2010   POLYPECTOMY  05/21/2022   Procedure: POLYPECTOMY INTESTINAL;  Surgeon: Eartha Angelia Sieving, MD;  [facility]: [facility];  Service: Gastroenterology;;   POLYPECTOMY  10/31/2022   Procedure: POLYPECTOMY;  Surgeon: Eartha Angelia Sieving, MD;  [facility]: [facility];  Service: Gastroenterology;;   POLYPECTOMY  08/07/2023   Procedure: POLYPECTOMY INTESTINAL;  Surgeon: Eartha Angelia, Sieving, MD;  Location: [facility];  Service: Gastroenterology;;   REDUCTION MAMMAPLASTY Bilateral    bilateral breast reduction nov 2019   TOTAL ABDOMINAL HYSTERECTOMY W/ BILATERAL SALPINGOOPHORECTOMY Left 04/16/2011    Outpatient Medications Prior to Visit  Medication Sig Dispense Refill   acyclovir  (ZOVIRAX ) 400 MG tablet Take 1 tablet (400 mg total) by mouth 2 (two) times daily. 180 tablet 2   ALPRAZolam  (XANAX ) 0.5 MG tablet Take 1 tablet (0.5 mg total) by mouth 2 (two) times daily  as needed for anxiety. 30 tablet 0   amphetamine -dextroamphetamine (ADDERALL XR) 30 MG 24 hr capsule Take 1 capsule (30 mg total) by mouth daily. 30 capsule 0   DOTTI  0.05 MG/24HR patch Place 1 patch onto the skin 2 (two) times a week.     fluticasone  (FLONASE ) 50 MCG/ACT nasal spray Place 2 sprays into both nostrils  daily. 16 g 6   meclizine  (ANTIVERT ) 12.5 MG tablet Take 1 tablet (12.5 mg total) by mouth 3 (three) times daily as needed for dizziness. 20 tablet 0   melatonin 3 MG TABS tablet Take 1 tablet (3 mg total) by mouth at bedtime. 90 tablet 0   Multiple Vitamins-Calcium (ONE-A-DAY WOMENS FORMULA PO) Take 1 drop by mouth daily.     pantoprazole  (PROTONIX ) 20 MG tablet Take 1 tablet (20 mg total) by mouth daily. 90 tablet 1   VAGIFEM  10 MCG TABS vaginal tablet Place 1 tablet vaginally at bedtime.     fluconazole  (DIFLUCAN ) 150 MG tablet Take 1 tablet (150 mg total) by mouth daily. (Patient not taking: Reported on 01/17/2025) 1 tablet 1   No facility-administered medications prior to visit.    Allergies[1]     Objective:    BP 120/85   Pulse 81   Temp 97.9 F (36.6 C)   Ht 5' 1 (1.549 m)   Wt 147 lb 6.4 oz (66.9 kg)   SpO2 98%   BMI 27.85 kg/m    Physical Exam Vitals and nursing note reviewed.  Constitutional:      Appearance: Normal appearance. She is normal weight.  HENT:     Head: Normocephalic and atraumatic.     Right Ear: Tympanic membrane, ear canal and external ear normal.     Left Ear: Tympanic membrane, ear canal and external ear normal.     Nose: Congestion present.     Right Sinus: Maxillary sinus tenderness present.     Mouth/Throat:     Mouth: Mucous membranes are moist.     Pharynx: Oropharynx is clear.  Eyes:     Extraocular Movements: Extraocular movements intact.     Conjunctiva/sclera: Conjunctivae normal.  Cardiovascular:     Rate and Rhythm: Normal rate and regular rhythm.     Pulses: Normal pulses.     Heart sounds: Normal heart sounds.  Pulmonary:     Effort: Pulmonary effort is normal.     Breath sounds: Normal breath sounds.  Musculoskeletal:     Cervical back: No tenderness.  Lymphadenopathy:     Cervical: No cervical adenopathy.  Skin:    General: Skin is warm and dry.  Neurological:     General: No focal deficit present.     Mental  Status: She is alert and oriented to person, place, and time. Mental status is at baseline.  Psychiatric:        Mood and Affect: Mood normal.        Behavior: Behavior normal.        Thought Content: Thought content normal.        Judgment: Judgment normal.       No results found for any visits on 01/17/25.     Assessment & Plan:   Problem List Items Addressed This Visit       Respiratory   Acute bacterial sinusitis - Primary   Relevant Medications   doxycycline  (VIBRA -TABS) 100 MG tablet    Assessment and Plan Assessment & Plan Acute bacterial sinusitis Sinus congestion, pressure, postnasal drip, and mild cough  for six days. Suspected bacterial sinusitis due to duration and lack of improvement. - Prescribed doxycycline  100 mg BID for 5 days, extend to 7 days if needed. - Advised to hold multivitamin while on doxycycline . - Recommended Flonase  nasal spray daily. - Suggested Tylenol  for symptomatic relief.    Meds ordered this encounter  Medications   doxycycline  (VIBRA -TABS) 100 MG tablet    Sig: Take 1 tablet (100 mg total) by mouth 2 (two) times daily for 7 days.    Dispense:  14 tablet    Refill:  0    Supervising Provider:   DUANNE LOWERS T [[id number]]    Return if symptoms worsen or fail to improve.  Jeoffrey GORMAN Barrio, FNP [facility]      [1]  Allergies Allergen Reactions   Prozac  [Fluoxetine ] Other (See Comments)    HEADACHES   Wellbutrin [Bupropion] Other (See Comments)    NIGHTMARES   Cephalexin Rash   Ciprofloxacin Rash   Relafen [Nabumetone] Other (See Comments)    UNKNOWN   "

## 2025-01-23 ENCOUNTER — Ambulatory Visit (HOSPITAL_COMMUNITY)

## 2025-01-30 ENCOUNTER — Inpatient Hospital Stay (HOSPITAL_COMMUNITY): Admission: RE | Admit: 2025-01-30 | Source: Ambulatory Visit

## 2025-01-31 ENCOUNTER — Encounter: Payer: Self-pay | Admitting: Family Medicine

## 2025-01-31 ENCOUNTER — Other Ambulatory Visit: Payer: Self-pay

## 2025-02-01 ENCOUNTER — Other Ambulatory Visit: Payer: Self-pay | Admitting: Family Medicine

## 2025-02-01 MED ORDER — AMPHETAMINE-DEXTROAMPHET ER 30 MG PO CP24: 30.0000 mg | ORAL_CAPSULE | Freq: Every day | ORAL | 0 refills | Status: AC

## 2025-02-02 ENCOUNTER — Encounter: Payer: Self-pay | Admitting: Family Medicine

## 2025-02-02 ENCOUNTER — Ambulatory Visit: Admitting: Family Medicine

## 2025-02-02 ENCOUNTER — Telehealth: Admitting: Family Medicine

## 2025-02-02 VITALS — Ht 61.0 in | Wt 150.0 lb

## 2025-02-02 DIAGNOSIS — E782 Mixed hyperlipidemia: Secondary | ICD-10-CM

## 2025-02-02 DIAGNOSIS — S161XXA Strain of muscle, fascia and tendon at neck level, initial encounter: Secondary | ICD-10-CM | POA: Insufficient documentation

## 2025-02-02 DIAGNOSIS — E663 Overweight: Secondary | ICD-10-CM

## 2025-02-02 MED ORDER — CYCLOBENZAPRINE HCL 5 MG PO TABS: 5.0000 mg | ORAL_TABLET | Freq: Three times a day (TID) | ORAL | 1 refills | Status: AC | PRN

## 2025-02-02 MED ORDER — WEGOVY 4 MG PO TABS: 4.0000 mg | ORAL_TABLET | Freq: Every day | ORAL | 0 refills | Status: AC

## 2025-02-02 NOTE — Progress Notes (Signed)
 "                    MyChart Video Visit    Virtual Visit via Video Note   This format is felt to be most appropriate for this patient at this time. Physical exam was limited by quality of the video and audio technology used for the visit.    Patient location: home Provider location: [facility] Persons involved in the visit: patient, provider  I discussed the limitations of evaluation and management by telemedicine and the availability of in person appointments. The patient expressed understanding and agreed to proceed.  Patient: Sandra Parker   DOB: April 08, 1976   49 y.o. Female  MRN: [id number] Visit Date: 02/02/2025  Today's healthcare provider: Jeoffrey GORMAN Barrio, FNP   No chief complaint on file.   Subjective:    HPI  Discussed the use of AI scribe software for clinical note transcription with the patient, who gave verbal consent to proceed.  History of Present Illness Sandra Parker is a 49 year old female who presents with right-sided neck and shoulder pain radiating to the chest.  She has been experiencing an achy pain on the back of her right shoulder for the past two weeks, radiating to her neck and shooting down to her chest. The pain persists when she turns her head, causing tightness. She has been using a heating pad nightly and taking ibuprofen, but these measures have provided minimal relief.  She denies any recent injuries except for a fall on ice the morning of the visit, which did not exacerbate her symptoms. The pain does not shoot down her arm, nor does it cause numbness or tingling. No fevers, headaches, vision changes, or other unusual symptoms.  Her past medical history includes a previous spine x-ray in 2011, which was normal. She mentions a 'little hump' on her back, which she attributes to posture issues.  She has a family history of heart issues, as her brother had a heart attack, which causes her concern about her symptoms. She has  been managing her cholesterol levels, which have improved since November.  Other concerns include recent weight gain and inability to manage her weight with diet and exercise attempts. She consumes a heart healthy diet and exercises by walking daily.   Review of Systems  All other systems reviewed and are negative.   Last lipids Lab Results  Component Value Date   CHOL 196 11/22/2024   HDL 63 11/22/2024   LDLCALC 114 (H) 11/22/2024   TRIG 86 11/22/2024   CHOLHDL 3.1 11/22/2024         Objective:    Ht 5' 1 (1.549 m)   Wt 150 lb (68 kg)   BMI 28.34 kg/m   BP Readings from Last 3 Encounters:  01/17/25 120/85  12/30/24 122/88  11/22/24 118/80   Wt Readings from Last 3 Encounters:  02/02/25 150 lb (68 kg)  01/17/25 147 lb 6.4 oz (66.9 kg)  12/30/24 145 lb 6 oz (65.9 kg)        Physical Exam Constitutional:      General: She is not in acute distress.    Appearance: Normal appearance. She is not toxic-appearing.  Eyes:     Conjunctiva/sclera: Conjunctivae normal.  Pulmonary:     Effort: Pulmonary effort is normal. No respiratory distress.  Skin:    Coloration: Skin is not pale.  Neurological:     General: No focal deficit present.  Mental Status: She is alert and oriented to person, place, and time.  Psychiatric:        Mood and Affect: Mood normal.        Behavior: Behavior normal.        Thought Content: Thought content normal.        Judgment: Judgment normal.         Assessment & Plan:    Problem List Items Addressed This Visit       Musculoskeletal and Integument   Cervical strain - Primary     Other   Overweight (BMI 25.0-29.9)   Moderate mixed hyperlipidemia not requiring statin therapy    Assessment and Plan Assessment & Plan Cervical muscle strain Likely due to poor posture or sleeping position. Pain radiates from her right posterior neck to upper chest without neurological symptoms. No cardiac involvement or left sided pain. -  Prescribed Flexeril  (cyclobenzaprine ) up to three times daily, cautioning daytime use due to drowsiness. - Continue ibuprofen for pain. - Advised heating pad and cold therapy for relief. - Instructed to monitor for new or worsening symptoms and seek immediate evaluation if she occurs.  Hypercholesterolemia Cholesterol levels improved since November. Prior CT cardiac score was 0. Discussed weight management benefits. - Encouraged lifestyle modifications including balanced diet and regular exercise. - Recheck CAC score in 5-10 years. - Routine monitoring of lipid levels.  Overweight BMI is 28. Discussed Wegovy  for weight management due to high cholesterol. Insurance coverage limited to diabetics, but she is willing to pay out-of-pocket. Discussed side effects and dosing. She denies history of pancreatitis and personal or family history of MEN2 or MTC - Provided samples of Wegovy  1.5mg  daily and ordered 4mg  daily to start in 1 month if tolerated. . - Advised on potential side effects and dosing schedule of Wegovy  as well as administration instructions.    Meds ordered this encounter  Medications   cyclobenzaprine  (FLEXERIL ) 5 MG tablet    Sig: Take 1 tablet (5 mg total) by mouth 3 (three) times daily as needed for muscle spasms.    Dispense:  30 tablet    Refill:  1    Supervising Provider:   DUANNE LOWERS T [[id number]]   semaglutide -weight management (WEGOVY ) 4 MG tablet    Sig: Take 1 tablet (4 mg total) by mouth daily. Daily in morning on an empty stomach with 4 oz of water. Do not eat or drink for 30 minutes after dose.    Dispense:  30 tablet    Refill:  0    Supervising Provider:   DUANNE LOWERS T [[id number]]     Return if symptoms worsen or fail to improve.     I discussed the assessment and treatment plan with the patient. The patient was provided an opportunity to ask questions and all were answered. The patient agreed with the plan and demonstrated an understanding of the  instructions.   The patient was advised to call back or seek an in-person evaluation if the symptoms worsen or if the condition fails to improve as anticipated.  I provided 20 minutes of non-face-to-face time during this encounter.  Jeoffrey GORMAN Barrio, FNP [facility]    "

## 2025-02-06 ENCOUNTER — Ambulatory Visit (HOSPITAL_COMMUNITY)
